# Patient Record
Sex: Female | Born: 1935 | Race: White | Hispanic: No | Smoking: Former smoker
Health system: Southern US, Community
[De-identification: ages and names within clinical notes are randomized; demographics above are authoritative.]

## PROBLEM LIST (undated history)

## (undated) DIAGNOSIS — T8859XA Other complications of anesthesia, initial encounter: Secondary | ICD-10-CM

## (undated) DIAGNOSIS — M1990 Unspecified osteoarthritis, unspecified site: Secondary | ICD-10-CM

## (undated) DIAGNOSIS — T4145XA Adverse effect of unspecified anesthetic, initial encounter: Secondary | ICD-10-CM

## (undated) DIAGNOSIS — I4891 Unspecified atrial fibrillation: Secondary | ICD-10-CM

## (undated) DIAGNOSIS — E119 Type 2 diabetes mellitus without complications: Secondary | ICD-10-CM

## (undated) DIAGNOSIS — E039 Hypothyroidism, unspecified: Secondary | ICD-10-CM

## (undated) DIAGNOSIS — M797 Fibromyalgia: Secondary | ICD-10-CM

## (undated) DIAGNOSIS — I10 Essential (primary) hypertension: Secondary | ICD-10-CM

## (undated) HISTORY — PX: HIP SURGERY: SHX245

## (undated) HISTORY — PX: EYE SURGERY: SHX253

## (undated) HISTORY — PX: CHOLECYSTECTOMY: SHX55

---

## 1999-08-02 ENCOUNTER — Other Ambulatory Visit: Admission: RE | Admit: 1999-08-02 | Discharge: 1999-08-02 | Payer: Self-pay | Admitting: Obstetrics and Gynecology

## 1999-08-03 ENCOUNTER — Other Ambulatory Visit: Admission: RE | Admit: 1999-08-03 | Discharge: 1999-08-03 | Payer: Self-pay | Admitting: *Deleted

## 1999-09-24 ENCOUNTER — Ambulatory Visit (HOSPITAL_COMMUNITY): Admission: RE | Admit: 1999-09-24 | Discharge: 1999-09-24 | Payer: Self-pay | Admitting: Obstetrics & Gynecology

## 2004-10-28 ENCOUNTER — Ambulatory Visit (HOSPITAL_COMMUNITY): Admission: RE | Admit: 2004-10-28 | Discharge: 2004-10-28 | Payer: Self-pay | Admitting: Family Medicine

## 2004-11-01 ENCOUNTER — Emergency Department (HOSPITAL_COMMUNITY): Admission: EM | Admit: 2004-11-01 | Discharge: 2004-11-01 | Payer: Self-pay | Admitting: Emergency Medicine

## 2004-12-23 ENCOUNTER — Encounter: Admission: RE | Admit: 2004-12-23 | Discharge: 2004-12-23 | Payer: Self-pay | Admitting: Orthopedic Surgery

## 2005-04-12 ENCOUNTER — Ambulatory Visit: Payer: Self-pay | Admitting: Physical Medicine & Rehabilitation

## 2005-04-12 ENCOUNTER — Inpatient Hospital Stay (HOSPITAL_COMMUNITY): Admission: RE | Admit: 2005-04-12 | Discharge: 2005-04-15 | Payer: Self-pay | Admitting: Orthopedic Surgery

## 2005-04-15 ENCOUNTER — Inpatient Hospital Stay (HOSPITAL_COMMUNITY)
Admission: RE | Admit: 2005-04-15 | Discharge: 2005-04-22 | Payer: Self-pay | Admitting: Physical Medicine & Rehabilitation

## 2005-07-22 ENCOUNTER — Emergency Department (HOSPITAL_COMMUNITY): Admission: EM | Admit: 2005-07-22 | Discharge: 2005-07-22 | Payer: Self-pay | Admitting: Emergency Medicine

## 2010-12-11 ENCOUNTER — Encounter: Payer: Self-pay | Admitting: Orthopedic Surgery

## 2011-02-01 ENCOUNTER — Inpatient Hospital Stay (HOSPITAL_COMMUNITY)
Admission: EM | Admit: 2011-02-01 | Discharge: 2011-02-03 | DRG: 392 | Disposition: A | Discharge status: Home / Self Care | Payer: Medicare Other | Attending: Family Medicine | Admitting: Family Medicine

## 2011-02-01 ENCOUNTER — Emergency Department (HOSPITAL_COMMUNITY): Payer: Medicare Other

## 2011-02-01 DIAGNOSIS — R339 Retention of urine, unspecified: Secondary | ICD-10-CM | POA: Diagnosis present

## 2011-02-01 DIAGNOSIS — I10 Essential (primary) hypertension: Secondary | ICD-10-CM | POA: Diagnosis present

## 2011-02-01 DIAGNOSIS — K5289 Other specified noninfective gastroenteritis and colitis: Principal | ICD-10-CM | POA: Diagnosis present

## 2011-02-01 DIAGNOSIS — IMO0002 Reserved for concepts with insufficient information to code with codable children: Secondary | ICD-10-CM | POA: Diagnosis present

## 2011-02-01 DIAGNOSIS — M129 Arthropathy, unspecified: Secondary | ICD-10-CM | POA: Diagnosis present

## 2011-02-01 DIAGNOSIS — M5137 Other intervertebral disc degeneration, lumbosacral region: Secondary | ICD-10-CM | POA: Diagnosis present

## 2011-02-01 DIAGNOSIS — K922 Gastrointestinal hemorrhage, unspecified: Secondary | ICD-10-CM | POA: Diagnosis not present

## 2011-02-01 DIAGNOSIS — M51379 Other intervertebral disc degeneration, lumbosacral region without mention of lumbar back pain or lower extremity pain: Secondary | ICD-10-CM | POA: Diagnosis present

## 2011-02-01 LAB — DIFFERENTIAL
Basophils Absolute: 0 10*3/uL (ref 0.0–0.1)
Basophils Relative: 0 % (ref 0–1)
Eosinophils Absolute: 0.3 10*3/uL (ref 0.0–0.7)
Eosinophils Relative: 4 % (ref 0–5)
Lymphocytes Relative: 16 % (ref 12–46)
Lymphs Abs: 1.4 10*3/uL (ref 0.7–4.0)
Monocytes Absolute: 0.7 10*3/uL (ref 0.1–1.0)
Monocytes Relative: 8 % (ref 3–12)
Neutro Abs: 6.5 10*3/uL (ref 1.7–7.7)
Neutrophils Relative %: 73 % (ref 43–77)

## 2011-02-01 LAB — CBC
HCT: 36.8 % (ref 36.0–46.0)
Hemoglobin: 12.5 g/dL (ref 12.0–15.0)
MCHC: 34 g/dL (ref 30.0–36.0)
MCV: 90.4 fL (ref 78.0–100.0)
RDW: 13.8 % (ref 11.5–15.5)
WBC: 8.9 10*3/uL (ref 4.0–10.5)

## 2011-02-01 LAB — OCCULT BLOOD, POC DEVICE
Fecal Occult Bld: POSITIVE
Fecal Occult Bld: NEGATIVE

## 2011-02-01 LAB — URINE MICROSCOPIC-ADD ON

## 2011-02-01 LAB — COMPREHENSIVE METABOLIC PANEL
ALT: 20 U/L (ref 0–35)
Albumin: 3 g/dL — ABNORMAL LOW (ref 3.5–5.2)
Alkaline Phosphatase: 48 U/L (ref 39–117)
BUN: 29 mg/dL — ABNORMAL HIGH (ref 6–23)
CO2: 28 mEq/L (ref 19–32)
Calcium: 8.9 mg/dL (ref 8.4–10.5)
Chloride: 98 mEq/L (ref 96–112)
Creatinine, Ser: 1.25 mg/dL — ABNORMAL HIGH (ref 0.4–1.2)
GFR calc Af Amer: 49 mL/min — ABNORMAL LOW (ref >60)
GFR calc non Af Amer: 40 mL/min — ABNORMAL LOW (ref >60)
Glucose, Bld: 133 mg/dL — ABNORMAL HIGH (ref 70–99)
Potassium: 3 mEq/L — ABNORMAL LOW (ref 3.5–5.1)
Sodium: 135 mEq/L (ref 135–145)
Total Protein: 5.8 g/dL — ABNORMAL LOW (ref 6.0–8.3)

## 2011-02-01 LAB — URINALYSIS, ROUTINE W REFLEX MICROSCOPIC
Glucose, UA: NEGATIVE mg/dL
Nitrite: NEGATIVE
Protein, ur: NEGATIVE mg/dL
Specific Gravity, Urine: 1.026 (ref 1.005–1.030)
Urobilinogen, UA: 0.2 mg/dL (ref 0.0–1.0)
pH: 5.5 (ref 5.0–8.0)

## 2011-02-02 DIAGNOSIS — K625 Hemorrhage of anus and rectum: Secondary | ICD-10-CM

## 2011-02-02 DIAGNOSIS — N139 Obstructive and reflux uropathy, unspecified: Secondary | ICD-10-CM

## 2011-02-02 DIAGNOSIS — R197 Diarrhea, unspecified: Secondary | ICD-10-CM

## 2011-02-02 LAB — CBC
HCT: 35.5 % — ABNORMAL LOW (ref 36.0–46.0)
HCT: 37.6 % (ref 36.0–46.0)
Hemoglobin: 11.8 g/dL — ABNORMAL LOW (ref 12.0–15.0)
Hemoglobin: 13 g/dL (ref 12.0–15.0)
MCH: 29.9 pg (ref 26.0–34.0)
MCH: 31 pg (ref 26.0–34.0)
MCHC: 33.2 g/dL (ref 30.0–36.0)
MCHC: 34.6 g/dL (ref 30.0–36.0)
MCV: 90.1 fL (ref 78.0–100.0)
MCV: 89.7 fL (ref 78.0–100.0)
Platelets: 342 10*3/uL (ref 150–400)
Platelets: 384 10*3/uL (ref 150–400)
RBC: 3.94 MIL/uL (ref 3.87–5.11)
RBC: 4.19 MIL/uL (ref 3.87–5.11)
RDW: 13.6 % (ref 11.5–15.5)
RDW: 13.7 % (ref 11.5–15.5)
WBC: 7.1 10*3/uL (ref 4.0–10.5)

## 2011-02-02 LAB — BASIC METABOLIC PANEL
BUN: 18 mg/dL (ref 6–23)
CO2: 25 mEq/L (ref 19–32)
Calcium: 8.3 mg/dL — ABNORMAL LOW (ref 8.4–10.5)
Creatinine, Ser: 0.77 mg/dL (ref 0.4–1.2)
GFR calc non Af Amer: >60 mL/min (ref >60)
Glucose, Bld: 95 mg/dL (ref 70–99)
Potassium: 3.5 mEq/L (ref 3.5–5.1)
Sodium: 138 mEq/L (ref 135–145)

## 2011-02-02 LAB — CLOSTRIDIUM DIFFICILE BY PCR: Toxigenic C. Difficile by PCR: NEGATIVE

## 2011-02-03 LAB — CBC
HCT: 36.1 % (ref 36.0–46.0)
Hemoglobin: 12 g/dL (ref 12.0–15.0)
MCH: 30.2 pg (ref 26.0–34.0)
MCHC: 33.2 g/dL (ref 30.0–36.0)
MCV: 90.7 fL (ref 78.0–100.0)
Platelets: 376 10*3/uL (ref 150–400)
RBC: 3.98 MIL/uL (ref 3.87–5.11)
RDW: 13.7 % (ref 11.5–15.5)
WBC: 6.3 10*3/uL (ref 4.0–10.5)

## 2011-02-03 LAB — GIARDIA/CRYPTOSPORIDIUM SCREEN(EIA)
Cryptosporidium Screen (EIA): NEGATIVE
Giardia Screen - EIA: NEGATIVE

## 2011-02-03 LAB — COMPREHENSIVE METABOLIC PANEL
ALT: 16 U/L (ref 0–35)
AST: 17 U/L (ref 0–37)
Albumin: 2.7 g/dL — ABNORMAL LOW (ref 3.5–5.2)
Alkaline Phosphatase: 45 U/L (ref 39–117)
CO2: 27 mEq/L (ref 19–32)
Chloride: 108 mEq/L (ref 96–112)
Creatinine, Ser: 0.67 mg/dL (ref 0.4–1.2)
GFR calc Af Amer: >60 mL/min (ref >60)
GFR calc non Af Amer: >60 mL/min (ref >60)
Glucose, Bld: 121 mg/dL — ABNORMAL HIGH (ref 70–99)
Potassium: 4.2 mEq/L (ref 3.5–5.1)
Total Bilirubin: 0.4 mg/dL (ref 0.3–1.2)
Total Protein: 5.3 g/dL — ABNORMAL LOW (ref 6.0–8.3)

## 2011-02-03 LAB — URINE CULTURE
Colony Count: NO GROWTH
Culture  Setup Time: 201203140431
Culture: NO GROWTH

## 2011-02-06 LAB — STOOL CULTURE

## 2011-02-22 NOTE — Discharge Summary (Signed)
[name], [name]:  [id number]  MEDICAL RECORD NO.:  [id number]           PATIENT TYPE:  I  LOCATION:  5015                         FACILITY:  [facility]  PHYSICIAN:  Nestor Ramp, MD        DATE OF BIRTH:  26-Mar-1920  DATE OF ADMISSION:  02/01/2011 DATE OF DISCHARGE:  02/03/2011                              DISCHARGE SUMMARY   DISCHARGE DIAGNOSES: 1. Gastroenteritis. 2. Urinary retention, resolved. 3. Degenerative disk disease. 4. Hypertension. 5. History of urinary tract infection. 6. Arthritis. 7. Neuralgia.  DISCHARGE MEDICATIONS: 1. Benicar/olmesartan/hydrochlorothiazide 40/12.5 mg p.o. daily. 2. Calcium citrate 1 tablet p.o. daily. 3. Docusate 100 mg p.o. daily p.r.n. 4. Fish oil OTC 1 tablet p.o. daily. 5. Multivitamin p.o. daily. 6. Neurontin 300 mg p.o. daily at bedtime. 7. Tramadol 50 mg p.o. q.6 h p.r.n. for pain. 8. Vicodin 5/500 mg p.o. q.6 h p.r.n. 9. Cymbalta 30 mg p.o. daily.  Note:  This dose is decreased from     previous home dose.  LABS AND IMAGING:  Urine culture, no growth.  Creatinine 0.77, hemoglobin 12.0.  C. diff PCR negative.  Acute abdominal series showing no acute intra-abdominal abnormalities.  Hemoccult positive.  Stool ova and parasites negative.  BRIEF HOSPITAL COURSE:  This is an 77 year old female who presented with 3 days of diarrhea and urinary retention. 1. Diarrhea.  The patient had 3 days of severe diarrhea consisting     primarily of loose brown stools and included incontinence of such.     This was disconcerting for the patient and her daughter, therefore     she presented to the emergency department.  On initial evaluation,     the stool appeared to be normal; however, after rectal exam, the     patient did show some bright red blood per rectum.  This was self-     limited and the patient's hemoglobin was monitored throughout her     stay.  On arrival, the patient's hemoglobin was 12.5 and after  development of this lower GI bleed, remained stable at 13.0.  At     the time of discharge, her hemoglobin was 12.0 and she had no signs     of hemodynamic instability.  The bleeding aspects of her diarrhea     had resolved and her stooling episodes had diminished.  Lab and     stool studies were negative including C. diff PCR and the patient     denied associated abdominal pain, nausea, vomiting, fevers, or red     flag symptoms concerning for more insidious etiologies.  The     patient did receive maintenance IV fluids; however, she never     required significant hydration and was hemodynamically stable.  The     patient was tolerating normal oral diet prior to discharge.  2. Urinary retention.  The patient was found to have 700 mL of     residual urine in her bladder and was unable to void at the time of     admission.  The patient denied history of this sort of problem, and  a Foley catheter was placed for a short period of time.  After     discontinuation of this catheter, the patient had successful     voiding trial spontaneously.  Most likely this retention was iatrogenic in nature, as it resolved with decrease of her Cymbalta dose from 60 mg to 30 mg. The patient was monitored for 24 hours and postvoid     residual was a mere 15 mL prior to discharge.  3. Question of UTI.  When the patient presented, she had symptoms of     UTI with dysuria and urgency; however, UA was negative for white     blood cells and culture was negative.  She was covered initially on     empiric Keflex; however, this was discontinued at the time of     discharge.  The patient remained afebrile, and symptoms had     resolved before discharge.  DISCHARGE INSTRUCTIONS:  The patient was discharged to home with instructions to call clinic or emergency department if she continues to have blood in her stool, develops abdominal pain, fevers, or any other concerning symptoms.  APPOINTMENTS:  [name], MD.  PCP Appointment to made within 1-2 weeks.  FOLLOWUP ISSUES: 1. Resolution of GI bleeding, possible need for colonoscopy. 2. Resolution of urinary retention.  The patient was discharged to home in stable medical condition.    ______________________________ Lloyd Huger, MD   ______________________________ Nestor Ramp, MD    JK/MEDQ  D:  02/03/2011  T:  02/04/2011  Job:  [id number]  Electronically Signed by Lloyd Huger MD on 02/04/2011 08:56:07 PM Electronically Signed by Denny Levy MD on 02/22/2011 02:23:17 PM

## 2011-03-09 NOTE — H&P (Signed)
NAME:  Kelli Pena, Kelli Pena               ACCOUNT NO.:  [id number]  MEDICAL RECORD NO.:  [id number]           PATIENT TYPE:  I  LOCATION:  5015                         FACILITY:  [facility]  PHYSICIAN:  Emmagrace Runkel A. Sheffield Slider, M.D.    DATE OF BIRTH:  [birth year]  DATE OF ADMISSION:  02/01/2011 DATE OF DISCHARGE:                             HISTORY & PHYSICAL   PRIMARY CARE PHYSICIAN:  [name], MD, in [facility].  CHIEF COMPLAINT:  Diarrhea and urinary retention.  HISTORY OF PRESENT ILLNESS:  The patient is an 77 year old female who comes to the emergency department after diarrhea x3 days and urinary retention x3 days.  Since Sunday, she patient has had frequent watery stool sometimes every 15 minutes, positive incontinence of stools since diarrhea started, positive urinary retention x3 days, positive dysuria and burning with urination x3 days.  The patient states she feels tired and fatigued.  Has been taking Imodium with no relief.  Last antibiotic for a month ago for UTI.  Denies any bloody or dark stools.  In the ER, placed Foley cath, has 700 mL residual of urine.  REVIEW OF SYSTEMS:  No fever.  No chills.  Positive constipation prior to diarrhea.  No recent antibiotics.  Her last antibiotic was 3 months ago.  No melena.  No bright red blood per rectum.  Positive rough in taste off and on.  No sick contacts.  MEDICATIONS:  The patient is unsure of dosage, knows names. 1. Tramadol. 2. Vicodin. 3. Cymbalta. 4. Benicar. 5. HCTZ 40/12.5. 6. Fish oil. 7. Multivitamin. 8. Calcium 9. Neurontin. 10.Stool softener.  ALLERGIES:  TETANUS SHOT, swelling.  PAST MEDICAL HISTORY: 1. Neuralgia x15 years. 2. Arthritis. 3. Degenerative disk disease at L3-L4. 4. Hypertension. 5. UTI 3 months ago.  FAMILY HISTORY:  Mother, Parkinson's and breast cancer.  Father, CVA at age 32.  Daughter, healthy.  Twin sister, arthritis and PVD.  SOCIAL HISTORY:  The patient lives at home.  She is a widow.   Her daughter lives nearby.  Her daughter is a Engineer, civil (consulting).  No tobacco, stopped 40 years ago.  Occasional EtOH and no drugs.  PHYSICAL EXAMINATION:  VITAL SIGNS:  Blood pressure 108/56, heart rate 88, respiratory rate 17, and temp 97.9. GENERAL:  No acute distress, resting quietly in bed. HEENT:  Pupils are equal, round, and reactive to light and accommodation.  No lymphadenopathy. PULMONARY:  Clear to auscultation bilateral. CARDIOVASCULAR:  Regular rate and rhythm.  No murmurs, rubs, or gallops. ABDOMEN:  Soft, nontender, and nondistended.  No guarding.  No rebound. EXTREMITIES:  Trace lower extremity edema. NEURO:  Alert and orient x3, following all commands. RECTAL:  Oozing present around anus and surrounding buttock region. Good anal tone.  Positive pain and discomfort on rectal exam.  No stool is palpated.  LABORATORY DATA AND STUDY:  Hemoccult cards negative by primary team. Hemoccult card positive in the ER.  Acute abdominal series, no acute abnormality.  Urine micro, rare epi, 0-2 white blood cells, hyaline cast.  UA:  Small bili, negative nitrites, trace leukocyte esterase.  PERTINENT LABORATORY DATA:  Sodium 135, potassium 3.0, BUN  29, creatinine 1.25, glucose 133, total protein 5.8, calcium 8.9, albumin 3, AST 22, ALT 20, alk phos 48.  White blood cell 8.9 and hemoglobin 12.5.  ASSESSMENT AND PLAN:  The patient is an 77 year old female with diarrhea and urinary retention. 1. Diarrhea:  No melena.  No bright red blood per rectum.  One     positive Hemoccult.  One negative Hemoccult.  C. diff/PCR pending.     Stool O and P pending.  We will monitor strict I's and O's.  We     will give normal saline IV fluids at 100 mL per hour.  The patient     is encouraged to eat p.o. fluids and solids, unsure of cause at     this time. 2. Questionable urinary tract infection:  Urine culture pending.     Since the patient is symptomatic, we will treat with Keflex.     Reviewed med list.   No medications on home list have side effects     of urinary retention.  We will follow for urine culture.  If     negative, we will stop Keflex. 3. Decreased potassium:  We will replete with p.o. KCl.  Repeat BMET     in the morning. 4. Neuralgia and arthritis pain:  We will continue home pain regimen     of tramadol, Vicodin, Neurontin, and Cymbalta. 5. Hypertension:  We will continue to monitor closely.  We will hold     Benicar and HCTZ since creatinine increased.  Unsure of the     patient's baseline.  We will monitor blood pressure closely. 6. Diet:  Regular diet. 7. Prophylaxis:  Heparin 5000 units t.i.d. subcu. 8. Disposition:  Pending clinical improvement.     Ellin Mayhew, MD   ______________________________ Arnette Norris. Sheffield Slider, M.D.    DC/MEDQ  D:  02/02/2011  T:  02/02/2011  Job:  [id number]  Electronically Signed by Ellin Mayhew  on 03/08/2011 02:19:43 PM Electronically Signed by Zachery Dauer M.D. on 03/09/2011 04:40:22 AM

## 2012-12-26 ENCOUNTER — Emergency Department (HOSPITAL_COMMUNITY): Payer: Medicare Other

## 2012-12-26 ENCOUNTER — Inpatient Hospital Stay (HOSPITAL_COMMUNITY)
Admission: EM | Admit: 2012-12-26 | Discharge: 2012-12-29 | DRG: 534 | Disposition: A | Discharge status: Skilled Nursing Facility | Payer: Medicare Other | Attending: Internal Medicine | Admitting: Internal Medicine

## 2012-12-26 ENCOUNTER — Encounter (HOSPITAL_COMMUNITY): Payer: Self-pay | Admitting: *Deleted

## 2012-12-26 DIAGNOSIS — N390 Urinary tract infection, site not specified: Secondary | ICD-10-CM

## 2012-12-26 DIAGNOSIS — W19XXXA Unspecified fall, initial encounter: Secondary | ICD-10-CM

## 2012-12-26 DIAGNOSIS — I10 Essential (primary) hypertension: Secondary | ICD-10-CM | POA: Diagnosis present

## 2012-12-26 DIAGNOSIS — S63509A Unspecified sprain of unspecified wrist, initial encounter: Secondary | ICD-10-CM | POA: Diagnosis present

## 2012-12-26 DIAGNOSIS — E039 Hypothyroidism, unspecified: Secondary | ICD-10-CM | POA: Diagnosis present

## 2012-12-26 DIAGNOSIS — S72409A Unspecified fracture of lower end of unspecified femur, initial encounter for closed fracture: Principal | ICD-10-CM

## 2012-12-26 DIAGNOSIS — R42 Dizziness and giddiness: Secondary | ICD-10-CM

## 2012-12-26 DIAGNOSIS — E119 Type 2 diabetes mellitus without complications: Secondary | ICD-10-CM | POA: Diagnosis present

## 2012-12-26 DIAGNOSIS — S7290XA Unspecified fracture of unspecified femur, initial encounter for closed fracture: Secondary | ICD-10-CM

## 2012-12-26 DIAGNOSIS — W010XXA Fall on same level from slipping, tripping and stumbling without subsequent striking against object, initial encounter: Secondary | ICD-10-CM | POA: Diagnosis present

## 2012-12-26 DIAGNOSIS — R55 Syncope and collapse: Secondary | ICD-10-CM

## 2012-12-26 DIAGNOSIS — Y92009 Unspecified place in unspecified non-institutional (private) residence as the place of occurrence of the external cause: Secondary | ICD-10-CM

## 2012-12-26 HISTORY — DX: Type 2 diabetes mellitus without complications: E11.9

## 2012-12-26 HISTORY — DX: Adverse effect of unspecified anesthetic, initial encounter: T41.45XA

## 2012-12-26 HISTORY — DX: Other complications of anesthesia, initial encounter: T88.59XA

## 2012-12-26 HISTORY — DX: Fibromyalgia: M79.7

## 2012-12-26 HISTORY — DX: Hypothyroidism, unspecified: E03.9

## 2012-12-26 HISTORY — DX: Essential (primary) hypertension: I10

## 2012-12-26 HISTORY — DX: Unspecified osteoarthritis, unspecified site: M19.90

## 2012-12-26 LAB — TYPE AND SCREEN: ABO/RH(D): A POS

## 2012-12-26 LAB — URINALYSIS, ROUTINE W REFLEX MICROSCOPIC
Bilirubin Urine: NEGATIVE
Glucose, UA: NEGATIVE mg/dL
Specific Gravity, Urine: 1.017 (ref 1.005–1.030)
pH: 5.5 (ref 5.0–8.0)

## 2012-12-26 LAB — COMPREHENSIVE METABOLIC PANEL
ALT: 11 U/L (ref 0–35)
AST: 16 U/L (ref 0–37)
Albumin: 3.2 g/dL — ABNORMAL LOW (ref 3.5–5.2)
CO2: 27 mEq/L (ref 19–32)
Calcium: 9.3 mg/dL (ref 8.4–10.5)
Creatinine, Ser: 0.77 mg/dL (ref 0.50–1.10)
Sodium: 135 mEq/L (ref 135–145)
Total Protein: 6.5 g/dL (ref 6.0–8.3)

## 2012-12-26 LAB — CBC WITH DIFFERENTIAL/PLATELET
Basophils Absolute: 0 10*3/uL (ref 0.0–0.1)
Basophils Relative: 0 % (ref 0–1)
Eosinophils Absolute: 0.3 10*3/uL (ref 0.0–0.7)
Eosinophils Relative: 3 % (ref 0–5)
Lymphocytes Relative: 18 % (ref 12–46)
MCHC: 33.5 g/dL (ref 30.0–36.0)
MCV: 88.6 fL (ref 78.0–100.0)
Platelets: 420 10*3/uL — ABNORMAL HIGH (ref 150–400)
RDW: 15.1 % (ref 11.5–15.5)
WBC: 9.7 10*3/uL (ref 4.0–10.5)

## 2012-12-26 LAB — URINE MICROSCOPIC-ADD ON

## 2012-12-26 LAB — ABO/RH: ABO/RH(D): A POS

## 2012-12-26 LAB — PROTIME-INR
INR: 0.98 (ref 0.00–1.49)
Prothrombin Time: 12.9 seconds (ref 11.6–15.2)

## 2012-12-26 MED ORDER — ASPIRIN 325 MG PO TABS
325.0000 mg | ORAL_TABLET | Freq: Every day | ORAL | Status: DC
  Administered 2012-12-27 – 2012-12-29 (×3): 325 mg via ORAL
  Filled 2012-12-26 (×4): qty 1

## 2012-12-26 MED ORDER — LOSARTAN POTASSIUM 50 MG PO TABS
100.0000 mg | ORAL_TABLET | Freq: Every day | ORAL | Status: DC
  Administered 2012-12-27 – 2012-12-29 (×3): 100 mg via ORAL
  Filled 2012-12-26 (×3): qty 2

## 2012-12-26 MED ORDER — GABAPENTIN 300 MG PO CAPS
600.0000 mg | ORAL_CAPSULE | Freq: Three times a day (TID) | ORAL | Status: DC
  Administered 2012-12-26 – 2012-12-29 (×8): 600 mg via ORAL
  Filled 2012-12-26 (×10): qty 2

## 2012-12-26 MED ORDER — SODIUM CHLORIDE 0.9 % IJ SOLN
3.0000 mL | Freq: Two times a day (BID) | INTRAMUSCULAR | Status: DC
  Administered 2012-12-28 – 2012-12-29 (×3): 3 mL via INTRAVENOUS

## 2012-12-26 MED ORDER — VITAMIN D (ERGOCALCIFEROL) 1.25 MG (50000 UNIT) PO CAPS
50000.0000 [IU] | ORAL_CAPSULE | ORAL | Status: DC
  Administered 2012-12-27: 50000 [IU] via ORAL
  Filled 2012-12-26: qty 1

## 2012-12-26 MED ORDER — ROPINIROLE HCL 1 MG PO TABS
2.0000 mg | ORAL_TABLET | Freq: Every day | ORAL | Status: DC
  Administered 2012-12-26 – 2012-12-28 (×3): 2 mg via ORAL
  Filled 2012-12-26 (×4): qty 2

## 2012-12-26 MED ORDER — DULOXETINE HCL 60 MG PO CPEP
60.0000 mg | ORAL_CAPSULE | Freq: Every day | ORAL | Status: DC
  Administered 2012-12-27 – 2012-12-29 (×3): 60 mg via ORAL
  Filled 2012-12-26 (×3): qty 1

## 2012-12-26 MED ORDER — GABAPENTIN 600 MG PO TABS
300.0000 mg | ORAL_TABLET | Freq: Three times a day (TID) | ORAL | Status: DC | PRN
  Filled 2012-12-26: qty 0.5

## 2012-12-26 MED ORDER — DEXTROSE 5 % IV SOLN
1.0000 g | INTRAVENOUS | Status: DC
  Administered 2012-12-27 – 2012-12-28 (×3): 1 g via INTRAVENOUS
  Filled 2012-12-26 (×4): qty 10

## 2012-12-26 MED ORDER — GABAPENTIN 300 MG PO CAPS
300.0000 mg | ORAL_CAPSULE | Freq: Three times a day (TID) | ORAL | Status: DC | PRN
  Filled 2012-12-26: qty 1

## 2012-12-26 MED ORDER — POLYETHYLENE GLYCOL 3350 17 G PO PACK
17.0000 g | PACK | Freq: Every day | ORAL | Status: DC | PRN
  Filled 2012-12-26: qty 1

## 2012-12-26 MED ORDER — DOCUSATE SODIUM 100 MG PO CAPS
100.0000 mg | ORAL_CAPSULE | Freq: Two times a day (BID) | ORAL | Status: DC
  Administered 2012-12-26 – 2012-12-29 (×6): 100 mg via ORAL
  Filled 2012-12-26 (×7): qty 1

## 2012-12-26 MED ORDER — HYDROCHLOROTHIAZIDE 25 MG PO TABS
25.0000 mg | ORAL_TABLET | Freq: Every day | ORAL | Status: DC
  Administered 2012-12-27 – 2012-12-29 (×3): 25 mg via ORAL
  Filled 2012-12-26 (×3): qty 1

## 2012-12-26 MED ORDER — CELECOXIB 200 MG PO CAPS
200.0000 mg | ORAL_CAPSULE | Freq: Every evening | ORAL | Status: DC
Start: 2012-12-26 — End: 2012-12-29
  Administered 2012-12-26 – 2012-12-28 (×3): 200 mg via ORAL
  Filled 2012-12-26 (×4): qty 1

## 2012-12-26 MED ORDER — ENOXAPARIN SODIUM 40 MG/0.4ML ~~LOC~~ SOLN
40.0000 mg | SUBCUTANEOUS | Status: DC
  Administered 2012-12-26 – 2012-12-28 (×3): 40 mg via SUBCUTANEOUS
  Filled 2012-12-26 (×4): qty 0.4

## 2012-12-26 MED ORDER — MORPHINE SULFATE 2 MG/ML IJ SOLN
2.0000 mg | INTRAMUSCULAR | Status: AC | PRN
  Administered 2012-12-26 (×3): 2 mg via INTRAVENOUS
  Filled 2012-12-26 (×3): qty 1

## 2012-12-26 MED ORDER — LOSARTAN POTASSIUM-HCTZ 100-25 MG PO TABS: 1.0000 | ORAL_TABLET | Freq: Two times a day (BID) | ORAL | Status: DC

## 2012-12-26 MED ORDER — ONDANSETRON HCL 4 MG PO TABS: 4.0000 mg | ORAL_TABLET | Freq: Four times a day (QID) | ORAL | Status: DC | PRN

## 2012-12-26 MED ORDER — MORPHINE SULFATE 2 MG/ML IJ SOLN: 2.0000 mg | INTRAMUSCULAR | Status: DC | PRN

## 2012-12-26 MED ORDER — HYDROCODONE-ACETAMINOPHEN 5-325 MG PO TABS
1.0000 | ORAL_TABLET | Freq: Four times a day (QID) | ORAL | Status: DC | PRN
  Administered 2012-12-26 – 2012-12-27 (×2): 2 via ORAL
  Filled 2012-12-26 (×2): qty 2

## 2012-12-26 MED ORDER — TRAMADOL HCL 50 MG PO TABS
50.0000 mg | ORAL_TABLET | Freq: Four times a day (QID) | ORAL | Status: DC | PRN
  Administered 2012-12-26 – 2012-12-29 (×4): 50 mg via ORAL
  Filled 2012-12-26 (×4): qty 1

## 2012-12-26 MED ORDER — SODIUM CHLORIDE 0.9 % IV SOLN
INTRAVENOUS | Status: DC
  Administered 2012-12-26 – 2012-12-29 (×3): via INTRAVENOUS

## 2012-12-26 MED ORDER — SODIUM CHLORIDE 0.9 % IV SOLN
20.0000 mL | INTRAVENOUS | Status: DC
  Administered 2012-12-26: 20 mL via INTRAVENOUS

## 2012-12-26 MED ORDER — GABAPENTIN 600 MG PO TABS
600.0000 mg | ORAL_TABLET | Freq: Three times a day (TID) | ORAL | Status: DC
  Filled 2012-12-26: qty 1

## 2012-12-26 MED ORDER — LEVOTHYROXINE SODIUM 75 MCG PO TABS
75.0000 ug | ORAL_TABLET | Freq: Every day | ORAL | Status: DC
  Administered 2012-12-27 – 2012-12-29 (×3): 75 ug via ORAL
  Filled 2012-12-26 (×4): qty 1

## 2012-12-26 MED ORDER — ONDANSETRON HCL 4 MG/2ML IJ SOLN: 4.0000 mg | Freq: Four times a day (QID) | INTRAMUSCULAR | Status: DC | PRN

## 2012-12-26 NOTE — Progress Notes (Signed)
Cathey (pt's daughter) cell ph#:( [phone]

## 2012-12-26 NOTE — ED Notes (Signed)
Pt fell at home 2 days ago- was not eval at that time- denied LOC and pain after fall- Now left knee pain with movement- and swelling- pt refused immobilization- denies neck and back pain

## 2012-12-26 NOTE — ED Notes (Signed)
YNW:[id number]<[id number]> Expected date:<BR> Expected time:<BR> Means of arrival:<BR> Comments:<BR> 79 y/o F/ fall 2 days ago/ knee pain

## 2012-12-26 NOTE — ED Notes (Signed)
Attempted to give report. RN busy.

## 2012-12-26 NOTE — ED Notes (Signed)
Linwood Dibbles MD at bedside.

## 2012-12-26 NOTE — ED Provider Notes (Addendum)
History    CSN: [id number] Arrival date & time 12/26/12  1458 First MD Initiated Contact with Patient 12/26/12 1501    Chief Complaint  Patient presents with  . Fall   HPI  Pt fell at home 2 days ago.  Pt does not recall what happened.  She thinks she might have gotten a little dizzy.  She was able to get up after 5 minutes but she had a hard time standing.  Family was able to check on her and care for her through out the day.  They had to assist her at home and help her use a bed pan. Usually pt would be able to walk around with a cane. Pt is having pain in her right wrist and knee.  The knee is much more severe and very swollen. She has seen Dr Charlann Boxer in the past for her knee trouble so they contacted him.  Family states that he wants to see her in the ED. Past Medical History  Diagnosis Date  . Hypertension   . Diabetes mellitus without complication   . Arthritis     No past surgical history on file.  No family history on file.  History  Substance Use Topics  . Smoking status: Not on file  . Smokeless tobacco: Not on file  . Alcohol Use:     OB History    Grav Para Term Preterm Abortions TAB SAB Ect Mult Living                  Review of Systems  All other systems reviewed and are negative.    Allergies  Tetanus toxoids  Home Medications  No current outpatient prescriptions on file.  There were no vitals taken for this visit.  Physical Exam  Nursing note and vitals reviewed. Constitutional: She appears well-developed and well-nourished. No distress.  HENT:  Head: Normocephalic and atraumatic.  Right Ear: External ear normal.  Left Ear: External ear normal.  Eyes: Conjunctivae normal are normal. Right eye exhibits no discharge. Left eye exhibits no discharge. No scleral icterus.  Neck: Neck supple. No tracheal deviation present.  Cardiovascular: Normal rate, regular rhythm and intact distal pulses.   Pulmonary/Chest: Effort normal and breath sounds normal. No  stridor. No respiratory distress. She has no wheezes. She has no rales.  Abdominal: Soft. Bowel sounds are normal. She exhibits no distension. There is no tenderness. There is no rebound and no guarding.  Musculoskeletal: She exhibits tenderness. She exhibits no edema.       Right wrist: She exhibits swelling. She exhibits normal range of motion, no tenderness and no deformity.       Left knee: She exhibits decreased range of motion, swelling, effusion and deformity. She exhibits no laceration and no erythema. tenderness found.  Neurological: She is alert. She has normal strength. No sensory deficit. Cranial nerve deficit:  no gross defecits noted. She exhibits normal muscle tone. She displays no seizure activity. Coordination normal.  Skin: Skin is warm and dry. No rash noted.  Psychiatric: She has a normal mood and affect.    ED Course  Procedures (including critical care time) EKG Sinus rhythm, rate 81 Multiple premature atrial complexes Borderline low voltage in frontal leads Normal axis Prior EKG poor quality otherwise no significant change noted  Labs Reviewed  CBC WITH DIFFERENTIAL - Abnormal; Notable for the following:    Platelets 420 (*)     All other components within normal limits  COMPREHENSIVE METABOLIC PANEL - Abnormal;  Notable for the following:    Glucose, Bld 130 (*)     Albumin 3.2 (*)     GFR calc non Af Amer 71 (*)     GFR calc Af Amer 83 (*)     All other components within normal limits  PROTIME-INR  TYPE AND SCREEN  ABO/RH  URINALYSIS, ROUTINE W REFLEX MICROSCOPIC   Dg Wrist Complete Right  12/26/2012  *RADIOLOGY REPORT*  Clinical Data: Fall.  Wrist pain  RIGHT WRIST - COMPLETE 3+ VIEW  Comparison: None  Findings: The bones are diffusely osteopenic.  There is no acute fracture or subluxation identified.  Advanced osteoarthritis is noted at the basilar joint.  IMPRESSION:  1.  Basilar joint osteoarthritis. 2.  Osteopenia.   Original Report Authenticated By:  Signa Kell, M.D.    Dg Knee Complete 4 Views Left  12/26/2012  *RADIOLOGY REPORT*  Clinical Data: Fall.  Knee pain.  LEFT KNEE - COMPLETE 4+ VIEW  Comparison: None  Findings: Moderate sized joint effusion is identified.  There is a comminuted intra-articular fracture deformity involving the distal femur.  The fracture fragments appear to extend from the distal medial femoral metaphysis into the femoral notch.  The fracture is likely comminuted.  There is moderate to advanced tricompartment osteoarthritis with sharpening of the tibial spines, exuberant marginal spur formation and joint space narrowing.femoral  IMPRESSION:  1.  Comminuted intra-articular fracture of the distal femur. Advise further evaluation with noncontrast CT ofthe knee 2.  Joint effusion. 3.  Osteoarthritis.   Original Report Authenticated By: Signa Kell, M.D.      1. Fracture of distal femur   2. Fall   3. Near syncope       MDM  Discussed with Dr Rennis Chris.  Would recommend long leg splint.  Non operative care. He will see the patient in the ED  Pt is unable to return home with her injury.  Will consult with medical service for pain management, possible rehabilitation placement.  Patient is also not sure why she fell. It is possible she could have had a syncopal event. Currently she is not complaining of any focal neurologic deficits but she may benefit from further evaluation as to the reason why she fell and why she felt dizzy.   Celene Kras, MD 12/26/12 [phone]

## 2012-12-26 NOTE — ED Notes (Signed)
Pt requesting pain medication, but informed pt morphine is currently q4h and last dose was taken at 1800.

## 2012-12-26 NOTE — Consult Note (Signed)
Reason for Consult:Left knee and right wrist pain after fall, ?"dizzy spell" Referring Physician: EDP   HPI: Kelli Pena is an 79 y.o. female fell at home 2 days ago. Pt does not recall what happened. She thinks she might have gotten a little dizzy. She was able to get up after 5 minutes but she had a hard time standing. Family was able to check on her and care for her through out the day. They had to assist her at home and help her use a bed pan. Usually pt would be able to walk around with a cane. Pt is having pain in her right wrist and knee. The knee is much more severe and very swollen.  Past Medical History  Diagnosis Date  . Hypertension   . Diabetes mellitus without complication   . Arthritis   . Hypothyroid   . Fibromyalgia   . Complication of anesthesia     trouble waking up, very cold, hypotension    Past Surgical History  Procedure Date  . Hip surgery   . Cholecystectomy   . Eye surgery     History reviewed. No pertinent family history.  Social History:  reports that she has quit smoking. She has never used smokeless tobacco. She reports that she drinks alcohol. She reports that she does not use illicit drugs.  Allergies:  Allergies  Allergen Reactions  . Tetanus Toxoids     Medications: I have reviewed the patient's current medications.  Results for orders placed during the hospital encounter of 12/26/12 (from the past 48 hour(s))  CBC WITH DIFFERENTIAL     Status: Abnormal   Collection Time   12/26/12  3:33 PM      Component Value Range Comment   WBC 9.7  4.0 - 10.5 K/uL    RBC 4.31  3.87 - 5.11 MIL/uL    Hemoglobin 12.8  12.0 - 15.0 g/dL    HCT 16.1  09.6 - 04.5 %    MCV 88.6  78.0 - 100.0 fL    MCH 29.7  26.0 - 34.0 pg    MCHC 33.5  30.0 - 36.0 g/dL    RDW 40.9  81.1 - 91.4 %    Platelets 420 (*) 150 - 400 K/uL    Neutrophils Relative 69  43 - 77 %    Neutro Abs 6.7  1.7 - 7.7 K/uL    Lymphocytes Relative 18  12 - 46 %    Lymphs Abs 1.8  0.7 -  4.0 K/uL    Monocytes Relative 9  3 - 12 %    Monocytes Absolute 0.9  0.1 - 1.0 K/uL    Eosinophils Relative 3  0 - 5 %    Eosinophils Absolute 0.3  0.0 - 0.7 K/uL    Basophils Relative 0  0 - 1 %    Basophils Absolute 0.0  0.0 - 0.1 K/uL   COMPREHENSIVE METABOLIC PANEL     Status: Abnormal   Collection Time   12/26/12  3:33 PM      Component Value Range Comment   Sodium 135  135 - 145 mEq/L    Potassium 4.1  3.5 - 5.1 mEq/L    Chloride 98  96 - 112 mEq/L    CO2 27  19 - 32 mEq/L    Glucose, Bld 130 (*) 70 - 99 mg/dL    BUN 17  6 - 23 mg/dL    Creatinine, Ser 7.82  0.50 - 1.10 mg/dL  Calcium 9.3  8.4 - 10.5 mg/dL    Total Protein 6.5  6.0 - 8.3 g/dL    Albumin 3.2 (*) 3.5 - 5.2 g/dL    AST 16  0 - 37 U/L    ALT 11  0 - 35 U/L    Alkaline Phosphatase 67  39 - 117 U/L    Total Bilirubin 0.5  0.3 - 1.2 mg/dL    GFR calc non Af Amer 71 (*) >90 mL/min    GFR calc Af Amer 83 (*) >90 mL/min   PROTIME-INR     Status: Normal   Collection Time   12/26/12  3:33 PM      Component Value Range Comment   Prothrombin Time 12.9  11.6 - 15.2 seconds    INR 0.98  0.00 - 1.49   TYPE AND SCREEN     Status: Normal   Collection Time   12/26/12  3:33 PM      Component Value Range Comment   ABO/RH(D) A POS      Antibody Screen NEG      Sample Expiration 12/29/2012     ABO/RH     Status: Normal   Collection Time   12/26/12  3:33 PM      Component Value Range Comment   ABO/RH(D) A POS       Dg Wrist Complete Right  12/26/2012  *RADIOLOGY REPORT*  Clinical Data: Fall.  Wrist pain  RIGHT WRIST - COMPLETE 3+ VIEW  Comparison: None  Findings: The bones are diffusely osteopenic.  There is no acute fracture or subluxation identified.  Advanced osteoarthritis is noted at the basilar joint.  IMPRESSION:  1.  Basilar joint osteoarthritis. 2.  Osteopenia.   Original Report Authenticated By: Signa Kell, M.D.    Dg Knee Complete 4 Views Left  12/26/2012  *RADIOLOGY REPORT*  Clinical Data: Fall.  Knee pain.   LEFT KNEE - COMPLETE 4+ VIEW  Comparison: None  Findings: Moderate sized joint effusion is identified.  There is a comminuted intra-articular fracture deformity involving the distal femur.  The fracture fragments appear to extend from the distal medial femoral metaphysis into the femoral notch.  The fracture is likely comminuted.  There is moderate to advanced tricompartment osteoarthritis with sharpening of the tibial spines, exuberant marginal spur formation and joint space narrowing.femoral  IMPRESSION:  1.  Comminuted intra-articular fracture of the distal femur. Advise further evaluation with noncontrast CT ofthe knee 2.  Joint effusion. 3.  Osteoarthritis.   Original Report Authenticated By: Signa Kell, M.D.      Vitals Temp:  [97.9 F (36.6 C)] 97.9 F (36.6 C) (02/05 1517) Pulse Rate:  [81] 81  (02/05 1517) Resp:  [16] 16  (02/05 1517) BP: (156)/(51) 156/51 mmHg (02/05 1517) SpO2:  [93 %] 93 % (02/05 1517) There is no height or weight on file to calculate BMI.  Physical Exam: elderly WF, HOH, cooperative, [place]/AT No clavicular crepitance or tenderness. Left shoulder pain with motion (chronic from cuff tear). Right wrist swollen and ecchymotic, tender dorsally and limited ROM, grossly NV intact. RLE no gross bone or joint instability. Left LLE with diffuse swelling, tnderness, pain with attempted ROM. NV intact distally BLE.  Left knee with endstage arthrosis and minimally displaced distal femur fracture. R wrist neg for fx, severe thumb basal jt oa.     Assessment/Plan: Impression: Minimally displaced left distal femur fracture End stage knee OA Right wrist sprain  Treatment:  Knee immobilizer to LLE, keep well  padded and perform skin checks daily. OK for bed to chair transfers. Non weight bearing LLE Right wrist splint PRN, ad lib use RUE No CT scan F/u with [facility] 2 to 3 weeks for repeat xrays. Call if any questions  Tegan Burnside M 12/26/2012, 5:22 PM

## 2012-12-26 NOTE — H&P (Addendum)
[facility] History and Physical  Kelli Pena DGL:[id number] DOB: 1920-11-05 [name]: 12/26/2012  Referring physician: er PCP: Shelda Pal, MD  Specialists: ortho  Chief Complaint: fall  HPI: Kelli Pena is a 79 y.o. female  Who fell at home 2 days ago.  She normally walks with a cane.  She doesn't remember if she was dizzy when she fell or not but she has gotten dizzy in the past- mainly in the AM when she first wakes up.  She fell about 11PM.  After she fell, she was able to get to the phone and call her family.  Her daughter, a Engineer, civil (consulting), cared for her through out the day.  After the fall she had pain in her right wrist and left knee-- this worsened, with swelling so family brought her to the ER.  She denies LOC or losing control of her bladder/bowels.  She is actually constipated.  No CP, no SOB with daily activity  In the ER, she was found to have a fracture in her left femur (distal).  It also appears she has an UTI.  She was still requiring pain medications so she will be admitted for syncope/falls and pain management.    Review of Systems: all systems reviewed, negative unless stated above  Past Medical History  Diagnosis Date  . Hypertension   . Diabetes mellitus without complication   . Arthritis   . Hypothyroid   . Fibromyalgia   . Complication of anesthesia     trouble waking up, very cold, hypotension   Past Surgical History  Procedure Date  . Hip surgery   . Cholecystectomy   . Eye surgery    Social History:  reports that she has quit smoking. She has never used smokeless tobacco. She reports that she drinks alcohol. She reports that she does not use illicit drugs. Occasional alcohol Walks with a cane  Allergies  Allergen Reactions  . Tetanus Toxoids     Family History  Problem Relation Age of Onset  . Stroke Father     Prior to Admission medications   Medication Sig Start Date End Date Taking? Authorizing Provider  aspirin 325 MG tablet Take  325 mg by mouth daily with breakfast.   Yes Historical Provider, MD  celecoxib (CELEBREX) 200 MG capsule Take 200 mg by mouth every evening.    Yes Historical Provider, MD  DULoxetine (CYMBALTA) 60 MG capsule Take 60 mg by mouth every morning.   Yes Historical Provider, MD  gabapentin (NEURONTIN) 300 MG capsule Take 300 mg by mouth 3 (three) times daily as needed. Nerve pain   Yes Historical Provider, MD  gabapentin (NEURONTIN) 600 MG tablet Take 600 mg by mouth 3 (three) times daily.   Yes Historical Provider, MD  HYDROcodone-acetaminophen (NORCO/VICODIN) 5-325 MG per tablet Take 1 tablet by mouth every 6 (six) hours as needed. pain   Yes Historical Provider, MD  levothyroxine (SYNTHROID, LEVOTHROID) 75 MCG tablet Take 75 mcg by mouth daily.   Yes Historical Provider, MD  losartan-hydrochlorothiazide (HYZAAR) 100-25 MG per tablet Take 1 tablet by mouth 2 (two) times daily.   Yes Historical Provider, MD  metFORMIN (GLUCOPHAGE) 1000 MG tablet Take 1,000 mg by mouth 2 (two) times daily with a meal.   Yes Historical Provider, MD  rOPINIRole (REQUIP) 2 MG tablet Take 2 mg by mouth at bedtime.   Yes Historical Provider, MD  traMADol (ULTRAM) 50 MG tablet Take 50 mg by mouth every 6 (six) hours as needed.  Yes Historical Provider, MD  Vitamin D, Ergocalciferol, (DRISDOL) 50000 UNITS CAPS Take 50,000 Units by mouth 2 (two) times a week. Saturday   Yes Historical Provider, MD   Physical Exam: Filed Vitals:   12/26/12 1517  BP: 156/51  Pulse: 81  Temp: 97.9 F (36.6 C)  TempSrc: Oral  Resp: 16  SpO2: 93%     General:  Pleasant/cooperative- hard of hearing  Eyes: wnl  ENT: wnl  Neck: supple, no JVD  Cardiovascular: rrr  Respiratory: clear, no wheezing  Abdomen: +BS, soft, NT/ND  Skin: no rashes, few bruises  Musculoskeletal: right wrist swelling, left knee swelling, decreased ROM  Psychiatric: normal  Neurologic: no focal deficits  Labs on Admission:  Basic Metabolic  Panel:  Lab 12/26/12 1533  NA 135  K 4.1  CL 98  CO2 27  GLUCOSE 130*  BUN 17  CREATININE 0.77  CALCIUM 9.3  MG --  PHOS --   Liver Function Tests:  Lab 12/26/12 1533  AST 16  ALT 11  ALKPHOS 67  BILITOT 0.5  PROT 6.5  ALBUMIN 3.2*   No results found for this basename: LIPASE:5,AMYLASE:5 in the last 168 hours No results found for this basename: AMMONIA:5 in the last 168 hours CBC:  Lab 12/26/12 1533  WBC 9.7  NEUTROABS 6.7  HGB 12.8  HCT 38.2  MCV 88.6  PLT 420*   Cardiac Enzymes: No results found for this basename: CKTOTAL:5,CKMB:5,CKMBINDEX:5,TROPONINI:5 in the last 168 hours  BNP (last 3 results) No results found for this basename: PROBNP:3 in the last 8760 hours CBG: No results found for this basename: GLUCAP:5 in the last 168 hours  Radiological Exams on Admission: Dg Wrist Complete Right  12/26/2012  *RADIOLOGY REPORT*  Clinical Data: Fall.  Wrist pain  RIGHT WRIST - COMPLETE 3+ VIEW  Comparison: None  Findings: The bones are diffusely osteopenic.  There is no acute fracture or subluxation identified.  Advanced osteoarthritis is noted at the basilar joint.  IMPRESSION:  1.  Basilar joint osteoarthritis. 2.  Osteopenia.   Original Report Authenticated By: Signa Kell, M.D.    Dg Knee Complete 4 Views Left  12/26/2012  *RADIOLOGY REPORT*  Clinical Data: Fall.  Knee pain.  LEFT KNEE - COMPLETE 4+ VIEW  Comparison: None  Findings: Moderate sized joint effusion is identified.  There is a comminuted intra-articular fracture deformity involving the distal femur.  The fracture fragments appear to extend from the distal medial femoral metaphysis into the femoral notch.  The fracture is likely comminuted.  There is moderate to advanced tricompartment osteoarthritis with sharpening of the tibial spines, exuberant marginal spur formation and joint space narrowing.femoral  IMPRESSION:  1.  Comminuted intra-articular fracture of the distal femur. Advise further evaluation  with noncontrast CT ofthe knee 2.  Joint effusion. 3.  Osteoarthritis.   Original Report Authenticated By: Signa Kell, M.D.     EKG: Independently reviewed. Sinus with PAC  Assessment/Plan Active Problems:  UTI (lower urinary tract infection)  Dizziness  Fall  Femur fracture   Minimally displaced left distal femur fracture: Knee immobilizer to LLE, keep well padded and perform skin checks daily. OK for bed to chair transfers. Non weight bearing LLE Right wrist sprain: splint PRN F/up with ortho in 2-3 weeks for repeat x rays -PT/OT, may need placement   Dizziness/fall- place on tele, orthostatics, TSH, troponins  UTI- rocephin  HTN- continue home meds and monitor  Pain- cotninue home meds and add IV, transitioning to PO at D/C  ortho  Code Status: full Family Communication: daughter at bedside Disposition Plan: SNF?  Time spent: 70 min  Benjamine Mola Tennyson Kallen [facility] Pager [phone]  If 7PM-7AM, please contact night-coverage [email or web address] Password [facility] 12/26/2012, 5:42 PM

## 2012-12-26 NOTE — Progress Notes (Signed)
ED CM consulted by ED CM about possible needs for home health CM spoke with EDP, Roselyn Bering who states [facility] has been consulted for admission Pending. CM spoke with pt, daughter, [name] and female family member in ED. [name] states the pt and family choice is for snf placement and then home health services.  CM provided her with a list [place] home health agencies and list of snfs to assist with choices   HOME HEALTH AGENCIES SERVING [place]   Agencies that are Medicare-Certified and are affiliated with [facility] Health Agency  Telephone Number Address  [facility]   The [place] [facility] has ownership interest in this company; however, you are under no obligation to use this agency. [phone] or  [phone] [address] [place], [place] [address] [email or web address]   Agencies that are Medicare-Certified and are not affiliated with [facility] Agency Telephone Number Address  [facility] [phone] Fax [phone] [address] [place], [place]  [address] [email or web address]  [facility] [phone] or [phone] Fax [phone] [address] [place], [place] [address] [email or web address]  Care [facility] [phone] Fax [phone] [address] [place], [place] [address] [email or web address]  [facility] [phone] Fax [phone] [address] [place], [place]  [address] [email or web address]  [facility] [phone] Fax [phone] [address] Suite [place], [place] [address] [email or web address]  [facility] of [facility] [phone] [address] [place], [place]  [address] [email or web address]  [facility] [phone]  [address] [place], [place] [address] [email or web address]  [facility] [phone] or [phone] Fax number [phone] [address] [place], [place]  [phone] [email or web address]  [facility] [phone] Fax [phone] [address] [place], [place]  [address]  [facility]  [phone] Fax [phone] [address] [place], [place] [address] [email or web address]    LIST OF NURSING FACILITIES WITHIN 50 MILES OF [facility]/ [facility]   has ownership interest in the following facility, which is licensed to provide nursing facility care.  You are under no obligation to accept a bed at this facility or to receive care there.  NAME OF FACILITY ADDRESS TELEPHONE NUMBER MANAGED CARE CONTRACTS OFFERED BED OFFERED?  [facility]   [address] [place], [place]  [phone] United Health Care/AARP Medicare Complete, Fifth Third Bancorp,  [place], Pyramids Today's Options, Community education officer    The following facilities, listed by [employer], are also licensed to provide nursing facility care:  [employer]:  NAME OF FACILITY ADDRESS TELEPHONE NUMBER MANAGED CARE CONTRACTS OFFERED BED OFFERED?  [facility] [address] [place], [place] [phone] [facility] Medicare Complete,  [facility] Medicare Complete Choice, Evercare, Fifth Third Bancorp,  Coventry/Wellpath, Advantra Medicare,  Pyramid Today's Options/Pyramid Life   [facility] [address]  [place], [place]  [phone] [place], Blue Cross Blue Shield   CMS Energy Corporation Nursing and [facility] [address] [place], [place] [phone] [facility] Plans, [address], [address],  [place] (including Fast  Track Humana Paramedic)   [facility] [address] [place], [place]  [phone] Affiliated Computer Services, BCBS,  UHC/Medicare Complete,  Fifth Third Bancorp, Wellpath/ Coventry/Advantra Medicare   [employer]  [address] [place], [place]  [phone] [facility] PPO and private     [facility] [address] [place], [place]  [phone] Humana,  Today's Options,  Evercare, Medicare Complete   [facility] [employer] [employer] [address] [place], [place]  [phone] [facility] Advantage   [facility] [place] [phone] [address] [place], [place]  [phone] [facility] Medicare Advantage   [facility] [address] [place], [place]  [phone] Fifth Third [employer]anc[employer], AARP Medicare Complete, Evercare, Ross Stores, Today's Options/Pyramids, BCBS D'Lo, Aetna   [facility] [address] [place], [place]  [phone] Blue Medicare/Partners, AARP Medicare Complete, Evercare, Ross Stores, Today's Options/Pyramids, BCBS [place], [facility]   [facility] [address] [place], [place]  [phone]  Ezequiel Essex, [facility] [address] [place], [place] [phone] BCBS, [place], [place], [address], [address], [address], Secure Horizons, [place], [facility]   [place] [facility]  [address] [place], [place]  [phone] BCBS, [facility], Advantra Medicare, [facility] [address] [place], [place]  [phone] BCBS ,[place], [facility], Tricare   [facility]   [address]  [place], [place]  [phone] Aetna, Rosine Door, BCBS of [place], CHAMPUS, Coventry/Wellpath, Partners, [address], Copy, Location manager, The Progressive Corporation, UHC, Control and instrumentation engineer, [place]   [facility]  [address] [place], [place]  [phone] [facility] at [facility] [address] [place], [place]  [phone] [facility] Medicare   [facility] [address] [place], [place]  [phone] [address] [facility] , [place], [place], [place], [facility]    [facility] [address] [place], [place]  [phone] BCBS, [place], [place] Health Care    [facility] [phone] [address] [place], [place]  [phone] BCBS, [place], [place], [facility]   [facility] [address] [place], [place]  [phone]) [phone] [facility] Medicare   [place]: A [facility] and [facility] [address] [place], [place]  [phone] [facility] Medicare

## 2012-12-26 NOTE — ED Notes (Signed)
Attempted to call report. Receiving RN busy. 

## 2012-12-26 NOTE — ED Notes (Signed)
Dr Nilsa Nutting office made aware pt is in ED per vorb from EDP Roselyn Bering

## 2012-12-27 ENCOUNTER — Inpatient Hospital Stay (HOSPITAL_COMMUNITY): Payer: Medicare Other

## 2012-12-27 DIAGNOSIS — N390 Urinary tract infection, site not specified: Secondary | ICD-10-CM

## 2012-12-27 LAB — BASIC METABOLIC PANEL
BUN: 14 mg/dL (ref 6–23)
CO2: 26 mEq/L (ref 19–32)
Chloride: 99 mEq/L (ref 96–112)
GFR calc Af Amer: 86 mL/min — ABNORMAL LOW (ref >90)
Potassium: 3.8 mEq/L (ref 3.5–5.1)

## 2012-12-27 LAB — TROPONIN I
Troponin I: <0.3 ng/mL (ref <0.30)
Troponin I: <0.3 ng/mL (ref <0.30)

## 2012-12-27 LAB — CBC
HCT: 37.1 % (ref 36.0–46.0)
Hemoglobin: 12.4 g/dL (ref 12.0–15.0)
MCV: 88.5 fL (ref 78.0–100.0)
WBC: 9.2 10*3/uL (ref 4.0–10.5)

## 2012-12-27 MED ORDER — MORPHINE SULFATE 2 MG/ML IJ SOLN
2.0000 mg | INTRAMUSCULAR | Status: DC | PRN
  Administered 2012-12-27: 2 mg via INTRAVENOUS
  Filled 2012-12-27: qty 1

## 2012-12-27 MED ORDER — HYDROCODONE-ACETAMINOPHEN 5-325 MG PO TABS
1.0000 | ORAL_TABLET | ORAL | Status: DC | PRN
  Administered 2012-12-27 – 2012-12-28 (×2): 1 via ORAL
  Administered 2012-12-28: 2 via ORAL
  Administered 2012-12-29: 1 via ORAL
  Administered 2012-12-29: 2 via ORAL
  Filled 2012-12-27: qty 2
  Filled 2012-12-27: qty 1
  Filled 2012-12-27 (×2): qty 2

## 2012-12-27 NOTE — Care Management Note (Unsigned)
    Page 1 of 1   12/27/2012     11:59:07 AM   CARE MANAGEMENT NOTE 12/27/2012  Patient:  Kelli Pena, Kelli Pena   Account Number:  [id number]  Date Initiated:  12/27/2012  Documentation initiated by:  [facility]  Subjective/Objective Assessment:   ADMITTED W/FALL.L FEMUR FX,SYNCOPE.     Action/Plan:   FROM HOME ALONE.HAS DAUGHTER SUPPORT(NURSE).HAS PCP,PHARMACY.   Anticipated DC Date:  12/31/2012   Anticipated DC Plan:  SKILLED NURSING FACILITY      DC Planning Services  CM consult      Choice offered to / List presented to:             Status of service:  In process, will continue to follow Medicare Important Message given?   (If response is "NO", the following Medicare IM given date fields will be blank) Date Medicare IM given:   Date Additional Medicare IM given:    Discharge Disposition:    Per UR Regulation:  Reviewed for med. necessity/level of care/duration of stay  If discussed at Long Length of Stay Meetings, dates discussed:    Comments:  12/27/12 Domenik Trice RN,BSN NCM [phone] PT/OT-SNF.NWB,L KNEE IMMOBILIZER.ORTHO FOLLOWING.

## 2012-12-27 NOTE — Evaluation (Signed)
Physical Therapy Evaluation Patient Details Name: Kelli Pena MRN: [id number] DOB: 10-20-1920 Today's Date: 12/27/2012 Time: 7829-5621 PT Time Calculation (min): 16 min  PT Assessment / Plan / Recommendation Clinical Impression  Pt admitted after fall home and sustaining L distal femur fx.  Pt now currently NWB L LE and must maintain KI.  Pt would benefit from acute PT services in order to improve independence with transfers and w/c mobility to prepare for d/c to SNF.    PT Assessment  Patient needs continued PT services    Follow Up Recommendations  SNF;Supervision/Assistance - 24 hour    Does the patient have the potential to tolerate intense rehabilitation      Barriers to Discharge        Equipment Recommendations  Wheelchair (measurements PT)    Recommendations for Other Services     Frequency Min 3X/week    Precautions / Restrictions Precautions Precautions: Fall Required Braces or Orthoses: Knee Immobilizer - Left Knee Immobilizer - Left: On at all times Restrictions Weight Bearing Restrictions: Yes LLE Weight Bearing: Non weight bearing   Pertinent Vitals/Pain RN reports pt medicated around 8 am.  Pt reports pain with movement but better at rest. Repositioned to comfort in recliner.      Mobility  Bed Mobility Bed Mobility: Supine to Sit Supine to Sit: 3: Mod assist;With rails;HOB elevated Details for Bed Mobility Assistance: verbal cues for technique, pt able to move LEs over with cues however required assist for trunk and scooting to EOB Transfers Transfers: Stand to Sit;Sit to Stand;Stand Pivot Transfers Sit to Stand: 1: +2 Total assist;With upper extremity assist Sit to Stand: Patient Percentage: 50% Stand to Sit: 1: +2 Total assist Stand to Sit: Patient Percentage: 50% Stand Pivot Transfers: 1: +2 Total assist Stand Pivot Transfers: Patient Percentage: 50% Details for Transfer Assistance: demonstrated transfer prior to performing, increased cues  for NWB and pt able to maintain however unable to hop so pivoted foot and chair brought behind pt, verbal cues for safe technique, used RW for transfer Ambulation/Gait Ambulation/Gait Assistance: Not tested (comment)    Exercises     PT Diagnosis: Acute pain;Difficulty walking  PT Problem List: Decreased strength;Decreased activity tolerance;Decreased mobility;Decreased knowledge of use of DME;Pain;Decreased knowledge of precautions PT Treatment Interventions: DME instruction;Functional mobility training;Therapeutic activities;Therapeutic exercise;Balance training;Patient/family education;Wheelchair mobility training   PT Goals Acute Rehab PT Goals PT Goal Formulation: With patient Time For Goal Achievement: 01/10/13 Potential to Achieve Goals: Good Pt will go Supine/Side to Sit: with supervision PT Goal: Supine/Side to Sit - Progress: Goal set today Pt will go Sit to Supine/Side: with supervision PT Goal: Sit to Supine/Side - Progress: Goal set today Pt will go Sit to Stand: with supervision PT Goal: Sit to Stand - Progress: Goal set today Pt will go Stand to Sit: with supervision PT Goal: Stand to Sit - Progress: Goal set today Pt will Transfer Bed to Chair/Chair to Bed: with supervision PT Transfer Goal: Bed to Chair/Chair to Bed - Progress: Goal set today Pt will Propel Wheelchair: with modified independence;51 - 150 feet PT Goal: Propel Wheelchair - Progress: Goal set today  Visit Information  Last PT Received On: 12/27/12 Assistance Needed: +2 PT/OT Co-Evaluation/Treatment: Yes    Subjective Data  Subjective: I'll do what I can.   Prior Functioning  Home Living Lives With: Alone Type of Home: House Home Layout: One level Bathroom Shower/Tub: Engineer, manufacturing systems: Standard Additional Comments: plan will be to go to SNF for rehab  Prior Function Level of Independence: Independent Communication Communication: No difficulties (pt is HOH) Dominant Hand:  Right    Cognition  Cognition Overall Cognitive Status: Appears within functional limits for tasks assessed/performed Arousal/Alertness: Awake/alert Orientation Level: Appears intact for tasks assessed Behavior During Session: [facility] for tasks performed    Extremity/Trunk Assessment Right Lower Extremity Assessment RLE ROM/Strength/Tone: [facility] for tasks assessed (knee valgus present) Left Lower Extremity Assessment LLE ROM/Strength/Tone: Deficits;Due to precautions LLE ROM/Strength/Tone Deficits: maintained KI, able to perform ankle DF/PF   Balance    End of Session PT - End of Session Equipment Utilized During Treatment: Left knee immobilizer Activity Tolerance: Patient tolerated treatment well Patient left: in chair;with call bell/phone within reach;Other (comment) (with OT) Nurse Communication: Other (comment) (NWB, KI, and +2 assist written on white board)  GP     Les Longmore,KATHrine E 12/27/2012, 10:59 AM  Zenovia Jarred, PT, DPT 12/27/2012 Pager: [phone]

## 2012-12-27 NOTE — Progress Notes (Signed)
[facility] PROGRESS NOTE  Kelli Pena WUJ:[id number] DOB: Apr 12, 1920 [name]: 12/26/2012 PCP: Shelda Pal, MD  Assessment/Plan: Minimally displaced left distal femur fracture: Knee immobilizer to LLE, keep well padded and perform skin checks daily. OK for bed to chair transfers. Non weight bearing LLE  Right wrist sprain: splint PRN  F/up with ortho in 2-3 weeks for repeat x rays  -PT/OT, may need placement   Dizziness/fall- place on tele ok, orthostatics, TSH ok, troponins ok  UTI- rocephin day #2- await culture  HTN- continue home meds and monitor   Pain- cotninue home meds and add IV, transitioning to PO at D/C      Code Status: full Family Communication:  Disposition Plan: SNF   Consultants:  Charlann Boxer (ortho)  Procedures:    Antibiotics:  Rocephin 2/6  HPI/Subjective: Pain not controlled- still throbbing No SOB, no CP  Objective: Filed Vitals:   12/26/12 2045 12/27/12 0508 12/27/12 0509 12/27/12 0510  BP: 159/69 151/64 150/62 149/65  Pulse: 90 89 89 85  Temp: 98.5 F (36.9 C) 98.4 F (36.9 C)    TempSrc: Oral Oral    Resp: 18 18    Height: 4\' 11"  (1.499 m)     Weight: 70.6 kg (155 lb 10.3 oz)     SpO2: 98% 94% 94% 93%    Intake/Output Summary (Last 24 hours) at 12/27/12 0908 Last data filed at 12/27/12 0300  Gross per 24 hour  Intake 696.67 ml  Output    250 ml  Net 446.67 ml   Filed Weights   12/26/12 2045  Weight: 70.6 kg (155 lb 10.3 oz)    Exam:   General:  Pleasant/cooperative  Cardiovascular: rrr  Respiratory: clear anterior, no wheezing  Abdomen: +BS, soft, NT/ND  Data Reviewed: Basic Metabolic Panel:  Lab 12/27/12 9562 12/26/12 1533  NA 134* 135  K 3.8 4.1  CL 99 98  CO2 26 27  GLUCOSE 140* 130*  BUN 14 17  CREATININE 0.68 0.77  CALCIUM 8.8 9.3  MG -- --  PHOS -- --   Liver Function Tests:  Lab 12/26/12 1533  AST 16  ALT 11  ALKPHOS 67  BILITOT 0.5  PROT 6.5  ALBUMIN 3.2*   No results found for  this basename: LIPASE:5,AMYLASE:5 in the last 168 hours No results found for this basename: AMMONIA:5 in the last 168 hours CBC:  Lab 12/27/12 0225 12/26/12 1533  WBC 9.2 9.7  NEUTROABS -- 6.7  HGB 12.4 12.8  HCT 37.1 38.2  MCV 88.5 88.6  PLT 436* 420*   Cardiac Enzymes:  Lab 12/27/12 0225 12/26/12 2051  CKTOTAL -- --  CKMB -- --  CKMBINDEX -- --  TROPONINI <0.30 <0.30   BNP (last 3 results) No results found for this basename: PROBNP:3 in the last 8760 hours CBG: No results found for this basename: GLUCAP:5 in the last 168 hours  No results found for this or any previous visit (from the past 240 hour(s)).   Studies: Dg Wrist Complete Right  12/26/2012  *RADIOLOGY REPORT*  Clinical Data: Fall.  Wrist pain  RIGHT WRIST - COMPLETE 3+ VIEW  Comparison: None  Findings: The bones are diffusely osteopenic.  There is no acute fracture or subluxation identified.  Advanced osteoarthritis is noted at the basilar joint.  IMPRESSION:  1.  Basilar joint osteoarthritis. 2.  Osteopenia.   Original Report Authenticated By: Signa Kell, M.D.    Ct Knee Left Wo Contrast  12/27/2012  *RADIOLOGY REPORT*  Clinical Data:  Knee pain status post fall 1 day ago.  Evaluate distal femur fracture.  CT OF THE LEFT KNEE WITHOUT CONTRAST  Technique:  Multidetector CT imaging was performed according to the standard protocol. Multiplanar CT image reconstructions were also generated.  Comparison: Radiographs 12/26/2012.  Findings: There is a mildly displaced intra-articular fracture involving the medial femoral condyle.  This fracture demonstrates intercondylar extension and involves the posteromedial cortex of the metaphysis.  There is mild involvement of the medial femoral condyle anteriorly with 3 mm of cortical step off at the articular surface anteriorly, best seen on the reformatted images.  No involvement of the lateral femoral condyle is identified.  The patella, proximal tibia and proximal fibula are intact.   There are advanced tricompartmental degenerative changes with osteophytes.  There is subchondral sclerosis in the medial and femoral compartments.  The bones appear diffusely demineralized.  There is a large hemarthrosis.  A small amount of layering fat is present within a moderately distended Baker's cyst.  IMPRESSION:  1.  Mildly displaced intra-articular fracture of the medial femoral condyle with intercondylar extension as described. 2.  Associated large lipohemarthrosis. 3.  Underlying advanced tricompartmental degenerative changes.   Original Report Authenticated By: Carey Bullocks, M.D.    Dg Knee Complete 4 Views Left  12/26/2012  *RADIOLOGY REPORT*  Clinical Data: Fall.  Knee pain.  LEFT KNEE - COMPLETE 4+ VIEW  Comparison: None  Findings: Moderate sized joint effusion is identified.  There is a comminuted intra-articular fracture deformity involving the distal femur.  The fracture fragments appear to extend from the distal medial femoral metaphysis into the femoral notch.  The fracture is likely comminuted.  There is moderate to advanced tricompartment osteoarthritis with sharpening of the tibial spines, exuberant marginal spur formation and joint space narrowing.femoral  IMPRESSION:  1.  Comminuted intra-articular fracture of the distal femur. Advise further evaluation with noncontrast CT ofthe knee 2.  Joint effusion. 3.  Osteoarthritis.   Original Report Authenticated By: Signa Kell, M.D.     Scheduled Meds:    . aspirin  325 mg Oral Q breakfast  . cefTRIAXone (ROCEPHIN)  IV  1 g Intravenous Q24H  . celecoxib  200 mg Oral QPM  . docusate sodium  100 mg Oral BID  . DULoxetine  60 mg Oral Daily  . enoxaparin (LOVENOX) injection  40 mg Subcutaneous Q24H  . gabapentin  600 mg Oral TID  . hydrochlorothiazide  25 mg Oral Daily  . levothyroxine  75 mcg Oral QAC breakfast  . losartan  100 mg Oral Daily  . rOPINIRole  2 mg Oral QHS  . sodium chloride  3 mL Intravenous Q12H  . Vitamin D  (Ergocalciferol)  50,000 Units Oral 2 times weekly   Continuous Infusions:    . sodium chloride 50 mL/hr at 12/26/12 2204    Active Problems:  UTI (lower urinary tract infection)  Dizziness  Fall  Femur fracture    Time spent: 25    Marlin Canary  [facility] Pager [phone]. If 8PM-8AM, please contact night-coverage at [email or web address], password [facility] 12/27/2012, 9:08 AM  LOS: 1 day

## 2012-12-27 NOTE — Progress Notes (Signed)
   Subjective: Left distal femur fracture   Patient reports pain as moderate. No events throughout the night.  Objective:   VITALS:   Filed Vitals:   12/27/12  BP: 149/65  Pulse: 85  Temp: 98.4 F (36.9 C)   Resp: 18    Neurovascular intact Dorsiflexion/Plantar flexion intact No cellulitis present Compartment soft  LABS  Basename 12/27/12 0225 12/26/12 1533  HGB 12.4 12.8  HCT 37.1 38.2  WBC 9.2 9.7  PLT 436* 420*     Basename 12/27/12 0225 12/26/12 1533  NA 134* 135  K 3.8 4.1  BUN 14 17  CREATININE 0.68 0.77  GLUCOSE 140* 130*     Assessment/Plan: Left distal femur fracture CT of left knee ordered for further evaluation of the fracture Knee immobilizer to LLE, keep well padded and perform skin checks daily. OK for bed to chair transfers. Non weight bearing LLE  Up with therapy   Anastasio Auerbach. Marcille Barman   PAC  12/27/2012, 9:58 AM

## 2012-12-27 NOTE — Evaluation (Signed)
Occupational Therapy Evaluation Patient Details Name: Kelli Pena MRN: [id number] DOB: [birth year] Today's Date: 12/27/2012 Time: 4098-1191 OT Time Calculation (min): 16 min  OT Assessment / Plan / Recommendation Clinical Impression  Pt presents to OT s/p fall and is NWB on LLE. Pt will benefit from skilled OT for education and practice maintaining NWB status for ADL activity    OT Assessment  Patient needs continued OT Services    Follow Up Recommendations  SNF    Barriers to Discharge Decreased caregiver support    Equipment Recommendations  None recommended by OT    Recommendations for Other Services PT consult  Frequency  Min 2X/week    Precautions / Restrictions Precautions Precautions: Fall Required Braces or Orthoses: Knee Immobilizer - Left Knee Immobilizer - Left: On at all times Restrictions Weight Bearing Restrictions: Yes LLE Weight Bearing: Non weight bearing       ADL  Grooming: Performed;Brushing hair;Minimal assistance Where Assessed - Grooming: Supported sitting Upper Body Bathing: Simulated;Minimal assistance Where Assessed - Upper Body Bathing: Unsupported sitting Lower Body Bathing: Simulated;+2 Total assistance Lower Body Bathing: Patient Percentage: 50% Where Assessed - Lower Body Bathing: Supported sit to stand Upper Body Dressing: Simulated;Minimal assistance Where Assessed - Upper Body Dressing: Unsupported sitting Lower Body Dressing: Simulated;+2 Total assistance Lower Body Dressing: Patient Percentage: 50% Where Assessed - Lower Body Dressing: Supported sit to stand Toilet Transfer: Simulated;+2 Total assistance;Other (comment) (bed to chair) Toilet Transfer: Patient Percentage: 50%    OT Diagnosis: Generalized weakness;Acute pain  OT Problem List: Decreased strength;Decreased activity tolerance;Decreased safety awareness;Impaired balance (sitting and/or standing);Decreased knowledge of use of DME or AE;Pain OT Treatment Interventions:  Self-care/ADL training;DME and/or AE instruction;Patient/family education   OT Goals Acute Rehab OT Goals OT Goal Formulation: With patient Time For Goal Achievement: 01/10/13 Potential to Achieve Goals: Good ADL Goals Pt Will Perform Grooming: with supervision;Standing at sink ADL Goal: Grooming - Progress: Goal set today Pt Will Perform Lower Body Dressing: with min assist;with adaptive equipment;Sit to stand from chair ADL Goal: Lower Body Dressing - Progress: Goal set today Pt Will Transfer to Toilet: with min assist;Maintaining weight bearing status;3-in-1 ADL Goal: Toilet Transfer - Progress: Goal set today Pt Will Perform Toileting - Clothing Manipulation: with min assist;Standing;Sitting on 3-in-1 or toilet ADL Goal: Toileting - Clothing Manipulation - Progress: Goal set today  Visit Information  Last OT Received On: 12/27/12 Assistance Needed: +2    Subjective Data  Subjective: I dont know how i am going to do this ( maintain NWB)   Prior Functioning     Home Living Lives With: Alone Type of Home: House Home Layout: One level Bathroom Shower/Tub: Engineer, manufacturing systems: Standard Additional Comments: plan will be to go to SNF for rehab  Prior Function Level of Independence: Independent Communication Communication: No difficulties (pt is HOH) Dominant Hand: Right         Vision/Perception Vision - History Patient Visual Report: No change from baseline   Cognition  Cognition Overall Cognitive Status: Appears within functional limits for tasks assessed/performed Arousal/Alertness: Awake/alert Orientation Level: Appears intact for tasks assessed Behavior During Session: [facility] for tasks performed    Extremity/Trunk Assessment Right Lower Extremity Assessment RLE ROM/Strength/Tone: [facility] for tasks assessed (knee valgus present) Left Lower Extremity Assessment LLE ROM/Strength/Tone: Deficits;Due to precautions LLE ROM/Strength/Tone Deficits:  maintained KI, able to perform ankle DF/PF              End of Session OT - End of Session Equipment Utilized  During Treatment: Left knee immobilizer Activity Tolerance: Patient tolerated treatment well Patient left: in chair;with call bell/phone within reach  GO     Kelli Pena, Kelli Pena 12/27/2012, 10:53 AM

## 2012-12-28 MED ORDER — LIDOCAINE 5 % EX PTCH: 1.0000 | MEDICATED_PATCH | CUTANEOUS | Status: DC

## 2012-12-28 MED ORDER — HYDRALAZINE HCL 20 MG/ML IJ SOLN
10.0000 mg | Freq: Four times a day (QID) | INTRAMUSCULAR | Status: DC | PRN
  Administered 2012-12-28: 10 mg via INTRAVENOUS
  Filled 2012-12-28: qty 1

## 2012-12-28 MED ORDER — DSS 100 MG PO CAPS: 100.0000 mg | ORAL_CAPSULE | Freq: Two times a day (BID) | ORAL | Status: DC

## 2012-12-28 MED ORDER — LIDOCAINE 5 % EX PTCH
1.0000 | MEDICATED_PATCH | CUTANEOUS | Status: DC
  Administered 2012-12-28 – 2012-12-29 (×2): 1 via TRANSDERMAL
  Filled 2012-12-28 (×2): qty 1

## 2012-12-28 MED ORDER — ENOXAPARIN SODIUM 40 MG/0.4ML ~~LOC~~ SOLN: 40.0000 mg | SUBCUTANEOUS | Status: DC

## 2012-12-28 MED ORDER — HYDROCODONE-ACETAMINOPHEN 5-325 MG PO TABS
1.0000 | ORAL_TABLET | ORAL | Status: DC | PRN
Start: 2012-12-28 — End: 2012-12-29

## 2012-12-28 MED ORDER — LOSARTAN POTASSIUM 100 MG PO TABS: 100.0000 mg | ORAL_TABLET | Freq: Every day | ORAL | Status: DC

## 2012-12-28 MED ORDER — POLYETHYLENE GLYCOL 3350 17 G PO PACK: 17.0000 g | PACK | Freq: Every day | ORAL | Status: DC | PRN

## 2012-12-28 MED ORDER — HYDROCHLOROTHIAZIDE 25 MG PO TABS: 25.0000 mg | ORAL_TABLET | Freq: Every day | ORAL | Status: DC

## 2012-12-28 MED ORDER — METOPROLOL TARTRATE 12.5 MG HALF TABLET: 12.5000 mg | ORAL_TABLET | Freq: Two times a day (BID) | ORAL | Status: DC

## 2012-12-28 MED ORDER — METOPROLOL TARTRATE 12.5 MG HALF TABLET
12.5000 mg | ORAL_TABLET | Freq: Two times a day (BID) | ORAL | Status: DC
  Administered 2012-12-28 – 2012-12-29 (×3): 12.5 mg via ORAL
  Filled 2012-12-28 (×4): qty 1

## 2012-12-28 NOTE — Discharge Summary (Signed)
Physician Discharge Summary  ARDYN FORGE MVH:[id number] DOB: 1919/11/23 [name]: 12/26/2012  PCP: Shelda Pal, MD  Admit date: 12/26/2012 Discharge date: 12/29/2012  Time spent: 35 minutes  Recommendations for Outpatient Follow-up:  1. CBC, BMP 1 week 2. Monitor BP and increase NWB left leg  Knee immobilizer at all times except to bathe the leg to allow the knee to heal  Skin checks daily Follow up in 2 weeks at [facility].   Discharge Diagnoses:  Active Problems:   UTI (lower urinary tract infection)   Dizziness   Fall   Femur fracture   Discharge Condition: improved  Diet recommendation: cardiac  Filed Weights   12/26/12 2045  Weight: 70.6 kg (155 lb 10.3 oz)    History of present illness:  Kelli Pena is a 79 y.o. female  Who fell at home 2 days ago. She normally walks with a cane. She doesn't remember if she was dizzy when she fell or not but she has gotten dizzy in the past- mainly in the AM when she first wakes up. She fell about 11PM. After she fell, she was able to get to the phone and call her family. Her daughter, a Engineer, civil (consulting), cared for her through out the day. After the fall she had pain in her right wrist and left knee-- this worsened, with swelling so family brought her to the ER. She denies LOC or losing control of her bladder/bowels. She is actually constipated. No CP, no SOB with daily activity  In the ER, she was found to have a fracture in her left femur (distal). It also appears she has an UTI. She was still requiring pain medications so she will be admitted for syncope/falls and pain management.   Hospital Course:  Minimally displaced left distal femur fracture: Knee immobilizer to LLE, keep well padded and perform skin checks daily. OK for bed to chair transfers. Non weight bearing LLE  Right wrist sprain: splint PRN    Dizziness/fall- place on tele ok, orthostatics ok, TSH ok, troponins ok- on multiple medications that can cause dizziness-  can be addressed by PCP as an outpatient is dizziness continues after rehab  UTI- rocephin x 4 days- gram negative rods- treated   HTN- continue home meds- add metoprolol as BP still high- may need further titration as an outpatient   Pain- controlled with PO    Procedures:    Consultations:  Ortho  Discharge Exam: Filed Vitals:   12/28/12 0654 12/28/12 1329 12/28/12 2115 12/29/12 0731  BP: 149/56 147/65 149/77 156/84  Pulse:  82 77 83  Temp:  98.2 F (36.8 C) 97.2 F (36.2 C) 99.7 F (37.6 C)  TempSrc:  Oral Oral Oral  Resp:  18  16  Height:      Weight:      SpO2:  96% 98% 97%    General: pleasant/cooperative Cardiovascular: rrr Respiratory: clear anterior  Discharge Instructions      Discharge Orders   Future Orders Complete By Expires     Diet - low sodium heart healthy  As directed     Discharge instructions  As directed     Comments:      Monitor blood pressure NWB left leg Knee immobilizer at all times except to bathe the leg to allow the knee to heal Follow up in 2 weeks at [facility]. CBC, BMP in 1 wek    Increase activity slowly  As directed         Medication  List    STOP taking these medications       losartan-hydrochlorothiazide 100-25 MG per tablet  Commonly known as:  HYZAAR     traMADol 50 MG tablet  Commonly known as:  ULTRAM      TAKE these medications       aspirin 325 MG tablet  Take 325 mg by mouth daily with breakfast.     celecoxib 200 MG capsule  Commonly known as:  CELEBREX  Take 200 mg by mouth every evening.     DSS 100 MG Caps  Take 100 mg by mouth 2 (two) times daily.     DULoxetine 60 MG capsule  Commonly known as:  CYMBALTA  Take 60 mg by mouth every morning.     enoxaparin 40 MG/0.4ML injection  Commonly known as:  LOVENOX  Inject 0.4 mLs (40 mg total) into the skin daily.     gabapentin 600 MG tablet  Commonly known as:  NEURONTIN  Take 600 mg by mouth 3 (three) times daily.      gabapentin 300 MG capsule  Commonly known as:  NEURONTIN  Take 300 mg by mouth 3 (three) times daily as needed. Nerve pain     hydrochlorothiazide 25 MG tablet  Commonly known as:  HYDRODIURIL  Take 1 tablet (25 mg total) by mouth daily.     HYDROcodone-acetaminophen 5-325 MG per tablet  Commonly known as:  NORCO/VICODIN  Take 1-2 tablets by mouth every 4 (four) hours as needed.     levothyroxine 75 MCG tablet  Commonly known as:  SYNTHROID, LEVOTHROID  Take 75 mcg by mouth daily.     lidocaine 5 %  Commonly known as:  LIDODERM  Place 1 patch onto the skin daily. Remove & Discard patch within 12 hours or as directed by MD     losartan 100 MG tablet  Commonly known as:  COZAAR  Take 1 tablet (100 mg total) by mouth daily.     metFORMIN 1000 MG tablet  Commonly known as:  GLUCOPHAGE  Take 1,000 mg by mouth 2 (two) times daily with a meal.     metoprolol tartrate 12.5 mg Tabs  Commonly known as:  LOPRESSOR  Take 0.5 tablets (12.5 mg total) by mouth 2 (two) times daily.     polyethylene glycol packet  Commonly known as:  MIRALAX / GLYCOLAX  Take 17 g by mouth daily as needed.     rOPINIRole 2 MG tablet  Commonly known as:  REQUIP  Take 2 mg by mouth at bedtime.     Vitamin D (Ergocalciferol) 50000 UNITS Caps  Commonly known as:  DRISDOL  Take 50,000 Units by mouth 2 (two) times a week. Saturday       Follow-up Information   Follow up with Shelda Pal, MD In 2 weeks.   Contact information:   [address], [address] [place] [address] [phone]       Follow up with PCP once released from SNF.       The results of significant diagnostics from this hospitalization (including imaging, microbiology, ancillary and laboratory) are listed below for reference.    Significant Diagnostic Studies: Dg Wrist Complete Right  12/26/2012  *RADIOLOGY REPORT*  Clinical Data: Fall.  Wrist pain  RIGHT WRIST - COMPLETE 3+ VIEW  Comparison:  None  Findings: The bones are diffusely osteopenic.  There is no acute fracture or subluxation identified.  Advanced osteoarthritis is noted at the basilar  joint.  IMPRESSION:  1.  Basilar joint osteoarthritis. 2.  Osteopenia.   Original Report Authenticated By: Signa Kell, M.D.    Ct Knee Left Wo Contrast  12/27/2012  *RADIOLOGY REPORT*  Clinical Data: Knee pain status post fall 1 day ago.  Evaluate distal femur fracture.  CT OF THE LEFT KNEE WITHOUT CONTRAST  Technique:  Multidetector CT imaging was performed according to the standard protocol. Multiplanar CT image reconstructions were also generated.  Comparison: Radiographs 12/26/2012.  Findings: There is a mildly displaced intra-articular fracture involving the medial femoral condyle.  This fracture demonstrates intercondylar extension and involves the posteromedial cortex of the metaphysis.  There is mild involvement of the medial femoral condyle anteriorly with 3 mm of cortical step off at the articular surface anteriorly, best seen on the reformatted images.  No involvement of the lateral femoral condyle is identified.  The patella, proximal tibia and proximal fibula are intact.  There are advanced tricompartmental degenerative changes with osteophytes.  There is subchondral sclerosis in the medial and femoral compartments.  The bones appear diffusely demineralized.  There is a large hemarthrosis.  A small amount of layering fat is present within a moderately distended Baker's cyst.  IMPRESSION:  1.  Mildly displaced intra-articular fracture of the medial femoral condyle with intercondylar extension as described. 2.  Associated large lipohemarthrosis. 3.  Underlying advanced tricompartmental degenerative changes.   Original Report Authenticated By: Carey Bullocks, M.D.    Dg Knee Complete 4 Views Left  12/26/2012  *RADIOLOGY REPORT*  Clinical Data: Fall.  Knee pain.  LEFT KNEE - COMPLETE 4+ VIEW  Comparison: None  Findings: Moderate sized joint  effusion is identified.  There is a comminuted intra-articular fracture deformity involving the distal femur.  The fracture fragments appear to extend from the distal medial femoral metaphysis into the femoral notch.  The fracture is likely comminuted.  There is moderate to advanced tricompartment osteoarthritis with sharpening of the tibial spines, exuberant marginal spur formation and joint space narrowing.femoral  IMPRESSION:  1.  Comminuted intra-articular fracture of the distal femur. Advise further evaluation with noncontrast CT ofthe knee 2.  Joint effusion. 3.  Osteoarthritis.   Original Report Authenticated By: Signa Kell, M.D.     Microbiology: Recent Results (from the past 240 hour(s))  URINE CULTURE     Status: None   Collection Time    12/26/12  5:08 PM      Result Value Range Status   Specimen Description URINE, CATHETERIZED   Final   Special Requests NONE   Final   Culture  Setup Time 12/27/2012 01:42   Final   Colony Count >=100,000 COLONIES/ML   Final   Culture GRAM NEGATIVE RODS   Final   Report Status PENDING   Incomplete     Labs: Basic Metabolic Panel:  Recent Labs Lab 12/26/12 1533 12/27/12 0225  NA 135 134*  K 4.1 3.8  CL 98 99  CO2 27 26  GLUCOSE 130* 140*  BUN 17 14  CREATININE 0.77 0.68  CALCIUM 9.3 8.8   Liver Function Tests:  Recent Labs Lab 12/26/12 1533  AST 16  ALT 11  ALKPHOS 67  BILITOT 0.5  PROT 6.5  ALBUMIN 3.2*   No results found for this basename: LIPASE, AMYLASE,  in the last 168 hours No results found for this basename: AMMONIA,  in the last 168 hours CBC:  Recent Labs Lab 12/26/12 1533 12/27/12 0225  WBC 9.7 9.2  NEUTROABS 6.7  --  HGB 12.8 12.4  HCT 38.2 37.1  MCV 88.6 88.5  PLT 420* 436*   Cardiac Enzymes:  Recent Labs Lab 12/26/12 2051 12/27/12 0225 12/27/12 0845  TROPONINI <0.30 <0.30 <0.30   BNP: BNP (last 3 results) No results found for this basename: PROBNP,  in the last 8760 hours CBG: No  results found for this basename: GLUCAP,  in the last 168 hours     Signed:  Marlin Canary  [facility] 12/29/2012, 8:54 AM

## 2012-12-28 NOTE — Progress Notes (Signed)
Family has chosen [facility]. Paperwork signed today. CSW faxed med rec to [facility]. Patient will be discharged tomorrow.  Keeli Roberg C. Shaylon Gillean MSW, LCSW [phone]

## 2012-12-28 NOTE — Progress Notes (Signed)
Clinical Social Work Department BRIEF PSYCHOSOCIAL ASSESSMENT 12/28/2012  Patient:  LUDENE, STOKKE     Account Number:  [id number]     Admit date:  12/26/2012  Clinical Social Worker:  Doree Albee  Date/Time:  12/26/2012 07:00 PM  Referred by:  RN  Date Referred:  12/26/2012 Referred for  SNF Placement   Other Referral:   Interview type:  Patient Other interview type:   and family    PSYCHOSOCIAL DATA Living Status:  ALONE Admitted from facility:   Level of care:   Primary support name:  Janyth Contes Primary support relationship to patient:  CHILD, ADULT Degree of support available:   strong    CURRENT CONCERNS Current Concerns  Post-Acute Placement   Other Concerns:    SOCIAL WORK ASSESSMENT / PLAN CSW met with pt and pt family at bedside to complete psychosocial assessment. Pt lives at home alone and due to fall at home may need snf for short term rehab. Patient family interested in [facility] skilled nursing, with possible preference for [facility].    CSW will complete fl2 for md signature and place in shadow chart when completed.    CSW explained csw role and process for physical therapy and snf placement search. Pt and pt family are motivated for patient to complete rehab and reutrn home with home health once approrpriate.   Assessment/plan status:  Psychosocial Support/Ongoing Assessment of Needs Other assessment/ plan:   Information/referral to community resources:   skilled nursing facility list    PATIENT'S/FAMILY'S RESPONSE TO PLAN OF CARE: Pt and pt family thanked csw for concern and support. Pt is motivated to complete short term rehab if recommended and return home with home health.

## 2012-12-28 NOTE — Progress Notes (Signed)
Clinical Social Work Department CLINICAL SOCIAL WORK PLACEMENT NOTE 12/28/2012  Patient:  Kelli Pena, Kelli Pena  Account Number:  [id number] Admit date:  12/26/2012  Clinical Social Worker:  Becky Sax, LCSW  Date/time:  12/28/2012 12:00 M  Clinical Social Work is seeking post-discharge placement for this patient at the following level of care:   SKILLED NURSING   (*CSW will update this form in Epic as items are completed)   12/28/2012  Patient/family provided with [facility] Department of Clinical Social Work's list of facilities offering this level of care within the geographic area requested by the patient (or if unable, by the patient's family).  12/28/2012  Patient/family informed of their freedom to choose among providers that offer the needed level of care, that participate in Medicare, Medicaid or managed care program needed by the patient, have an available bed and are willing to accept the patient.  12/28/2012  Patient/family informed of [facility]' ownership interest in [facility], as well as of the fact that they are under no obligation to receive care at this facility.  PASARR submitted to EDS on 12/28/2012 PASARR number received from EDS on 12/28/2012  FL2 transmitted to all facilities in geographic area requested by pt/family on  12/28/2012 FL2 transmitted to all facilities within larger geographic area on 12/28/2012  Patient informed that his/her managed care company has contracts with or will negotiate with  certain facilities, including the following:     Patient/family informed of bed offers received:  12/28/2012 Patient chooses bed at [facility] Physician recommends and patient chooses bed at  [facility]  Patient to be transferred to [facility] on  12/29/2012 Patient to be transferred to facility by ptar  The following physician request were entered in Epic:   Additional Comments:

## 2012-12-28 NOTE — Progress Notes (Signed)
CSW spoke with patient's daughter. She requested info be faxed to [facility] and [facility]. Will meet this afternoon for choice.  Kelli Pena MSW, LCSW [phone]

## 2012-12-28 NOTE — Progress Notes (Signed)
[facility] PROGRESS NOTE  Kelli Pena ZOX:[id number] DOB: February 06, 1920 [name]: 12/26/2012 PCP: Shelda Pal, MD  Assessment/Plan: Minimally displaced left distal femur fracture: Knee immobilizer to LLE, keep well padded and perform skin checks daily. OK for bed to chair transfers. Non weight bearing LLE  Right wrist sprain: splint PRN  F/up with ortho in 2-3 weeks for repeat x rays  -PT/OT:SNF   Dizziness/fall- place on tele ok, orthostatics ok, TSH ok, troponins ok- on multiple medications that can cause diziness  UTI- rocephin day #3- await culture  HTN- continue home meds- add metoprolol as BP still high  Pain- controlled with PO      Code Status: full Family Communication:  Disposition Plan: SNF   Consultants:  Charlann Boxer (ortho)  Procedures:    Antibiotics:  Rocephin 2/6  HPI/Subjective: Still some throbbing in knee C/o pain in left shoulder  Objective: Filed Vitals:   12/27/12 1443 12/28/12 0519 12/28/12 0552 12/28/12 0654  BP: 152/64 181/65 183/55 149/56  Pulse: 90 96    Temp: 98 F (36.7 C) 98 F (36.7 C)    TempSrc: Oral Oral    Resp: 16 16    Height:      Weight:      SpO2: 95% 96%      Intake/Output Summary (Last 24 hours) at 12/28/12 0851 Last data filed at 12/28/12 0600  Gross per 24 hour  Intake   2320 ml  Output   1050 ml  Net   1270 ml   Filed Weights   12/26/12 2045  Weight: 70.6 kg (155 lb 10.3 oz)    Exam:   General:  Pleasant/cooperative  Cardiovascular: rrr  Respiratory: clear anterior, no wheezing  Abdomen: +BS, soft, NT/ND  Data Reviewed: Basic Metabolic Panel:  Lab 12/27/12 8119 12/26/12 1533  NA 134* 135  K 3.8 4.1  CL 99 98  CO2 26 27  GLUCOSE 140* 130*  BUN 14 17  CREATININE 0.68 0.77  CALCIUM 8.8 9.3  MG -- --  PHOS -- --   Liver Function Tests:  Lab 12/26/12 1533  AST 16  ALT 11  ALKPHOS 67  BILITOT 0.5  PROT 6.5  ALBUMIN 3.2*   No results found for this basename: LIPASE:5,AMYLASE:5  in the last 168 hours No results found for this basename: AMMONIA:5 in the last 168 hours CBC:  Lab 12/27/12 0225 12/26/12 1533  WBC 9.2 9.7  NEUTROABS -- 6.7  HGB 12.4 12.8  HCT 37.1 38.2  MCV 88.5 88.6  PLT 436* 420*   Cardiac Enzymes:  Lab 12/27/12 0845 12/27/12 0225 12/26/12 2051  CKTOTAL -- -- --  CKMB -- -- --  CKMBINDEX -- -- --  TROPONINI <0.30 <0.30 <0.30   BNP (last 3 results) No results found for this basename: PROBNP:3 in the last 8760 hours CBG: No results found for this basename: GLUCAP:5 in the last 168 hours  No results found for this or any previous visit (from the past 240 hour(s)).   Studies: Dg Wrist Complete Right  12/26/2012  *RADIOLOGY REPORT*  Clinical Data: Fall.  Wrist pain  RIGHT WRIST - COMPLETE 3+ VIEW  Comparison: None  Findings: The bones are diffusely osteopenic.  There is no acute fracture or subluxation identified.  Advanced osteoarthritis is noted at the basilar joint.  IMPRESSION:  1.  Basilar joint osteoarthritis. 2.  Osteopenia.   Original Report Authenticated By: Signa Kell, M.D.    Ct Knee Left Wo Contrast  12/27/2012  *RADIOLOGY REPORT*  Clinical Data: Knee pain status post fall 1 day ago.  Evaluate distal femur fracture.  CT OF THE LEFT KNEE WITHOUT CONTRAST  Technique:  Multidetector CT imaging was performed according to the standard protocol. Multiplanar CT image reconstructions were also generated.  Comparison: Radiographs 12/26/2012.  Findings: There is a mildly displaced intra-articular fracture involving the medial femoral condyle.  This fracture demonstrates intercondylar extension and involves the posteromedial cortex of the metaphysis.  There is mild involvement of the medial femoral condyle anteriorly with 3 mm of cortical step off at the articular surface anteriorly, best seen on the reformatted images.  No involvement of the lateral femoral condyle is identified.  The patella, proximal tibia and proximal fibula are intact.  There  are advanced tricompartmental degenerative changes with osteophytes.  There is subchondral sclerosis in the medial and femoral compartments.  The bones appear diffusely demineralized.  There is a large hemarthrosis.  A small amount of layering fat is present within a moderately distended Baker's cyst.  IMPRESSION:  1.  Mildly displaced intra-articular fracture of the medial femoral condyle with intercondylar extension as described. 2.  Associated large lipohemarthrosis. 3.  Underlying advanced tricompartmental degenerative changes.   Original Report Authenticated By: Carey Bullocks, M.D.    Dg Knee Complete 4 Views Left  12/26/2012  *RADIOLOGY REPORT*  Clinical Data: Fall.  Knee pain.  LEFT KNEE - COMPLETE 4+ VIEW  Comparison: None  Findings: Moderate sized joint effusion is identified.  There is a comminuted intra-articular fracture deformity involving the distal femur.  The fracture fragments appear to extend from the distal medial femoral metaphysis into the femoral notch.  The fracture is likely comminuted.  There is moderate to advanced tricompartment osteoarthritis with sharpening of the tibial spines, exuberant marginal spur formation and joint space narrowing.femoral  IMPRESSION:  1.  Comminuted intra-articular fracture of the distal femur. Advise further evaluation with noncontrast CT ofthe knee 2.  Joint effusion. 3.  Osteoarthritis.   Original Report Authenticated By: Signa Kell, M.D.     Scheduled Meds:    . aspirin  325 mg Oral Q breakfast  . cefTRIAXone (ROCEPHIN)  IV  1 g Intravenous Q24H  . celecoxib  200 mg Oral QPM  . docusate sodium  100 mg Oral BID  . DULoxetine  60 mg Oral Daily  . enoxaparin (LOVENOX) injection  40 mg Subcutaneous Q24H  . gabapentin  600 mg Oral TID  . hydrochlorothiazide  25 mg Oral Daily  . levothyroxine  75 mcg Oral QAC breakfast  . losartan  100 mg Oral Daily  . rOPINIRole  2 mg Oral QHS  . sodium chloride  3 mL Intravenous Q12H  . Vitamin D  (Ergocalciferol)  50,000 Units Oral 2 times weekly   Continuous Infusions:    . sodium chloride 50 mL/hr at 12/27/12 1643    Active Problems:  UTI (lower urinary tract infection)  Dizziness  Fall  Femur fracture    Time spent: 25    Marlin Canary  [facility] Pager [phone]. If 8PM-8AM, please contact night-coverage at [email or web address], password [facility] 12/28/2012, 8:51 AM  LOS: 2 days

## 2012-12-28 NOTE — Progress Notes (Signed)
   Subjective: Mildly displaced intra-articular fracture involving the medial femoral condyle left knee  Patient reports pain as mild, well controlled.   Objective:   VITALS:   Filed Vitals:   12/28/12 1329  BP: 147/65  Pulse: 82  Temp: 98.2 F (36.8 C)  Resp: 18    Neurovascular intact Dorsiflexion/Plantar flexion intact No cellulitis present Compartment soft  LABS  Basename 12/27/12 0225 12/26/12 1533  HGB 12.4 12.8  HCT 37.1 38.2  WBC 9.2 9.7  PLT 436* 420*     Basename 12/27/12 0225 12/26/12 1533  NA 134* 135  K 3.8 4.1  BUN 14 17  CREATININE 0.68 0.77  GLUCOSE 140* 130*     Assessment/Plan: Mildly displaced intra-articular fracture involving the medial femoral condyle left knee Orthopaedically stable Discussion was had with daughter regarding situation and it has been decided to wait on any surgery at this time. Can be discharged back to SNF when ready medically NWB left leg Knee immobilizer at all times except to bathe the leg to allow the knee to heal Follow up in 2 weeks at [facility] Follow up with OLIN,Lailani Tool D in 2 weeks.  Contact information:  [facility] [address] [place] [address] [phone]         Anastasio Auerbach. Kelli Pena   PAC  12/28/2012, 1:30 PM

## 2012-12-29 LAB — URINE CULTURE

## 2012-12-29 MED ORDER — HYDROCODONE-ACETAMINOPHEN 5-325 MG PO TABS: 1.0000 | ORAL_TABLET | ORAL | Status: DC | PRN

## 2012-12-29 NOTE — Progress Notes (Signed)
Pt to be d/c today to [facility]   Pt and family agreeable. Confirmed plans with facility.  Plan transfer via EMS.   Myliah Medel, LCSWA  Weekend Coverage [phone]   

## 2012-12-29 NOTE — Progress Notes (Signed)
Physical Therapy Treatment Patient Details Name: Kelli Pena MRN: [id number] DOB: October 16, 1920 Today's Date: 12/29/2012 Time: 4098-1191 PT Time Calculation (min): 26 min  PT Assessment / Plan / Recommendation Comments on Treatment Session  Pt. with c/o decreased pain of L leg today. Pt. tries very hard. to go to SNF today/    Follow Up Recommendations  SNF     Does the patient have the potential to tolerate intense rehabilitation     Barriers to Discharge        Equipment Recommendations  None recommended by PT    Recommendations for Other Services    Frequency Min 3X/week   Plan Discharge plan remains appropriate;Frequency remains appropriate    Precautions / Restrictions Precautions Precautions: Fall Required Braces or Orthoses: Knee Immobilizer - Left Knee Immobilizer - Left: On at all times Restrictions Weight Bearing Restrictions: Yes LLE Weight Bearing: Non weight bearing   Pertinent Vitals/Pain     Mobility  Bed Mobility Bed Mobility: Sit to Supine Supine to Sit: 1: +2 Total assist Supine to Sit: Patient Percentage: 30% Sit to Supine: 1: +2 Total assist Sit to Supine: Patient Percentage: 10% Details for Bed Mobility Assistance: assistance for L leg support. KI adjusted and pad placed at back of lowere leg to pad stays. Transfers Transfers: Sit to Stand;Stand to Sit Sit to Stand: 1: +2 Total assist;From bed;With upper extremity assist Sit to Stand: Patient Percentage: 40% Stand to Sit: 1: +2 Total assist Stand to Sit: Patient Percentage: 40% Stand Pivot Transfers: Not tested (comment) Details for Transfer Assistance: pt assisted to stand at RW, Support given to LLE to prevent weight on L leg. RW was not optimal height for pt. so standing was difficult.    Exercises     PT Diagnosis:    PT Problem List:   PT Treatment Interventions:     PT Goals Acute Rehab PT Goals PT Goal Formulation: With patient Time For Goal Achievement: 01/10/13 Potential to  Achieve Goals: Good Pt will go Supine/Side to Sit: with supervision PT Goal: Supine/Side to Sit - Progress: Progressing toward goal Pt will go Sit to Supine/Side: with supervision PT Goal: Sit to Supine/Side - Progress: Progressing toward goal Pt will go Sit to Stand: with supervision PT Goal: Sit to Stand - Progress: Progressing toward goal Pt will go Stand to Sit: with supervision PT Goal: Stand to Sit - Progress: Progressing toward goal Pt will Transfer Bed to Chair/Chair to Bed: with supervision PT Transfer Goal: Bed to Chair/Chair to Bed - Progress: Progressing toward goal Pt will Propel Wheelchair: 51 - 150 feet;with modified independence PT Goal: Propel Wheelchair - Progress: Progressing toward goal  Visit Information  Last PT Received On: 12/29/12 Assistance Needed: +2    Subjective Data  Subjective: I can't put any weight on my l;eg   Cognition  Cognition Overall Cognitive Status: Appears within functional limits for tasks assessed/performed    Balance     End of Session PT - End of Session Equipment Utilized During Treatment: Gait belt;Left knee immobilizer Activity Tolerance: Patient tolerated treatment well Patient left: in bed;with call bell/phone within reach Nurse Communication: Mobility status   GP     Rada Hay 12/29/2012, 11:43 AM

## 2013-02-13 ENCOUNTER — Other Ambulatory Visit: Payer: Self-pay | Admitting: Geriatric Medicine

## 2013-02-13 MED ORDER — HYDROCODONE-ACETAMINOPHEN 5-325 MG PO TABS: ORAL_TABLET | ORAL | Status: DC

## 2013-03-19 ENCOUNTER — Non-Acute Institutional Stay (SKILLED_NURSING_FACILITY): Payer: Medicare Other | Admitting: Internal Medicine

## 2013-03-19 DIAGNOSIS — S8290XS Unspecified fracture of unspecified lower leg, sequela: Secondary | ICD-10-CM

## 2013-03-19 DIAGNOSIS — E039 Hypothyroidism, unspecified: Secondary | ICD-10-CM

## 2013-03-19 DIAGNOSIS — I10 Essential (primary) hypertension: Secondary | ICD-10-CM

## 2013-03-19 DIAGNOSIS — S7292XS Unspecified fracture of left femur, sequela: Secondary | ICD-10-CM

## 2013-03-19 DIAGNOSIS — K5900 Constipation, unspecified: Secondary | ICD-10-CM

## 2013-03-21 ENCOUNTER — Non-Acute Institutional Stay (SKILLED_NURSING_FACILITY): Payer: Medicare Other | Admitting: Adult Health

## 2013-03-21 DIAGNOSIS — F3289 Other specified depressive episodes: Secondary | ICD-10-CM

## 2013-03-21 DIAGNOSIS — F329 Major depressive disorder, single episode, unspecified: Secondary | ICD-10-CM

## 2013-03-21 DIAGNOSIS — S7290XD Unspecified fracture of unspecified femur, subsequent encounter for closed fracture with routine healing: Secondary | ICD-10-CM

## 2013-03-21 DIAGNOSIS — S7292XD Unspecified fracture of left femur, subsequent encounter for closed fracture with routine healing: Secondary | ICD-10-CM

## 2013-03-21 DIAGNOSIS — G589 Mononeuropathy, unspecified: Secondary | ICD-10-CM

## 2013-03-21 DIAGNOSIS — I10 Essential (primary) hypertension: Secondary | ICD-10-CM

## 2013-03-21 DIAGNOSIS — G629 Polyneuropathy, unspecified: Secondary | ICD-10-CM

## 2013-03-21 DIAGNOSIS — E119 Type 2 diabetes mellitus without complications: Secondary | ICD-10-CM

## 2013-03-21 DIAGNOSIS — F32A Depression, unspecified: Secondary | ICD-10-CM

## 2013-03-21 DIAGNOSIS — G2581 Restless legs syndrome: Secondary | ICD-10-CM

## 2013-03-21 DIAGNOSIS — E039 Hypothyroidism, unspecified: Secondary | ICD-10-CM

## 2013-03-21 DIAGNOSIS — K5900 Constipation, unspecified: Secondary | ICD-10-CM

## 2013-04-02 DIAGNOSIS — I10 Essential (primary) hypertension: Secondary | ICD-10-CM | POA: Insufficient documentation

## 2013-04-02 DIAGNOSIS — K5900 Constipation, unspecified: Secondary | ICD-10-CM | POA: Insufficient documentation

## 2013-04-02 DIAGNOSIS — E039 Hypothyroidism, unspecified: Secondary | ICD-10-CM | POA: Insufficient documentation

## 2013-04-02 NOTE — Progress Notes (Signed)
Patient ID: Kelli Pena, female   DOB: 04-Sep-1920, 79 y.o.   MRN: [id number]        PROGRESS NOTE  DATE: 03/19/2013  FACILITY: Nursing Home Location: [facility]  LEVEL OF CARE: SNF (31)  Routine Visit   CHIEF COMPLAINT:  Manage hypertension, hypothyroidism and constipation.    HISTORY OF PRESENT ILLNESS:  REASSESSMENT OF ONGOING PROBLEM(S):  CONSTIPATION: The constipation remains stable. No complications from the medications presently being used. Patient denies ongoing constipation, abdominal pain, nausea or vomiting.   HTN: Pt 's HTN remains stable.  Denies CP, sob, DOE, pedal edema, headaches, dizziness or visual disturbances.  No complications from the medications currently being used.  Last BP : 112/72, 102/57, 110/61.  HYPOTHYROIDISM: The hypothyroidism remains stable. No complications noted from the medications presently being used.  The patient denies fatigue or constipation.  Last TSH:  A recent TSH is not available.   PAST MEDICAL HISTORY : Reviewed.  No changes.  CURRENT MEDICATIONS: Reviewed per [facility]  REVIEW OF SYSTEMS:  GENERAL: no change in appetite, no fatigue, no weight changes, no fever, chills or weakness RESPIRATORY: no cough, SOB, DOE, wheezing, hemoptysis CARDIAC: no chest pain, edema or palpitations GI: no abdominal pain, diarrhea, constipation, heart burn, nausea or vomiting  PHYSICAL EXAMINATION  VS:  T 99.2      P 88      RR 20     BP 112/72    POX %     WT (Lb)  GENERAL: no acute distress, normal body habitus EYES: conjunctivae normal, sclerae normal, normal eye lids NECK: supple, trachea midline, no neck masses, no thyroid tenderness, no thyromegaly LYMPHATICS: no LAN in the neck, no supraclavicular LAN RESPIRATORY: breathing is even & unlabored, BS CTAB CARDIAC: RRR, no murmur,no extra heart sounds, no edema GI: abdomen soft, normal BS, no masses, no tenderness, no hepatomegaly, no splenomegaly PSYCHIATRIC: the patient is  alert & oriented to person, affect & behavior appropriate  LABS/RADIOLOGY: 12/2012:  Platelet count 829, otherwise CBC normal.    Glucose 163, calcium 10.7, otherwise BMP normal.    ASSESSMENT/PLAN:  Hypothyroidism.  Check TSH.   Constipation.  Well controlled.   Hypertension.  Well controlled.    Left femur fracture.  Status post repair.   Currently undergoing rehabilitation.   Depression.  Continue Cymbalta.  Denies ongoing symptoms.    Arthritis.  No complaints of pain.    Diabetes mellitus.  Continue Glucophage.  Check  hemoglobin A1C.   Check liver profile.    CPT CODE: 40981

## 2013-04-05 ENCOUNTER — Encounter: Payer: Self-pay | Admitting: Adult Health

## 2013-04-05 DIAGNOSIS — S7292XA Unspecified fracture of left femur, initial encounter for closed fracture: Secondary | ICD-10-CM | POA: Insufficient documentation

## 2013-04-05 DIAGNOSIS — G629 Polyneuropathy, unspecified: Secondary | ICD-10-CM | POA: Insufficient documentation

## 2013-04-05 DIAGNOSIS — E119 Type 2 diabetes mellitus without complications: Secondary | ICD-10-CM | POA: Insufficient documentation

## 2013-04-05 DIAGNOSIS — G2581 Restless legs syndrome: Secondary | ICD-10-CM | POA: Insufficient documentation

## 2013-04-05 DIAGNOSIS — F329 Major depressive disorder, single episode, unspecified: Secondary | ICD-10-CM | POA: Insufficient documentation

## 2013-04-05 NOTE — Progress Notes (Signed)
  Subjective:    Patient ID: Kelli Pena, female    DOB: Sep 12, 1920, 78 y.o.   MRN: [id number]  HPI This is a 79 year old female who is for discharge home with Home health PT, OT, Nursing and CNA. She has been admitted to [facility] on 12/29/12 from [facility] with minimally displaced femur fracture sustained from a fall. She has been admitted for a short-term rehabilitation   Review of Systems  Constitutional: Negative.   HENT: Negative.   Eyes: Negative.   Respiratory: Negative for cough and shortness of breath.   Cardiovascular: Negative for leg swelling.  Gastrointestinal: Negative.   Endocrine: Negative.   Genitourinary: Negative.   Neurological: Negative.   Hematological: Negative for adenopathy. Does not bruise/bleed easily.  Psychiatric/Behavioral: Negative.        Objective:   Physical Exam  Nursing note and vitals reviewed. Constitutional: She is oriented to person, place, and time. She appears well-developed and well-nourished.  HENT:  Head: Normocephalic and atraumatic.  Right Ear: External ear normal.  Left Ear: External ear normal.  Eyes: Conjunctivae and EOM are normal.  Neck: Normal range of motion. Neck supple. No thyromegaly present.  Cardiovascular: Normal rate, regular rhythm and normal heart sounds.   Pulmonary/Chest: Effort normal and breath sounds normal.  Abdominal: Soft. Bowel sounds are normal.  Musculoskeletal: She exhibits no edema and no tenderness.  Neurological: She is alert and oriented to person, place, and time.  Skin: Skin is warm and dry.  Psychiatric: She has a normal mood and affect. Her behavior is normal. Judgment and thought content normal.   LABS:  4/14  Liver profile nl except total protein 5.6  hgbA1c 5.8 2/14  Cbc nl except platelet count 829  NA 132  K 4.4  Glucose 163  BUN 17  Creatinine 0.72   Calcium 10.7  Medications reviewed per [facility]    Assessment & Plan:  Diabetes mellitus  - latest hgbA1c 5.8;  discontinue Glucophage  Depression - stable; continue Cymbalta  Neuropathy - stable; continue Neurontin  Restless leg syndrome - stable; continue Requip  Femur fracture, left - for Home health PT, OT, Nursing and CNA follow-up  Unspecified hypothyroidism - stable; continue Synthroid  Unspecified constipation - no complaints of; continue Miralax  Essential hypertension, benign - well-controlled; continue Cozaar, HCTZ and Lopressor

## 2013-04-24 ENCOUNTER — Other Ambulatory Visit: Payer: Self-pay | Admitting: Orthopedic Surgery

## 2013-04-24 DIAGNOSIS — S72452A Displaced supracondylar fracture without intracondylar extension of lower end of left femur, initial encounter for closed fracture: Secondary | ICD-10-CM

## 2013-04-29 ENCOUNTER — Ambulatory Visit
Admission: RE | Admit: 2013-04-29 | Discharge: 2013-04-29 | Disposition: A | Discharge status: Home / Self Care | Payer: Medicare Other | Source: Ambulatory Visit | Attending: Orthopedic Surgery | Admitting: Orthopedic Surgery

## 2013-04-29 DIAGNOSIS — S72452A Displaced supracondylar fracture without intracondylar extension of lower end of left femur, initial encounter for closed fracture: Secondary | ICD-10-CM

## 2013-05-09 ENCOUNTER — Ambulatory Visit (INDEPENDENT_AMBULATORY_CARE_PROVIDER_SITE_OTHER): Payer: Medicare Other | Admitting: Family Medicine

## 2013-05-09 ENCOUNTER — Other Ambulatory Visit: Payer: Self-pay | Admitting: Family Medicine

## 2013-05-09 VITALS — BP 134/73 | HR 66

## 2013-05-09 DIAGNOSIS — G2581 Restless legs syndrome: Secondary | ICD-10-CM

## 2013-05-09 DIAGNOSIS — E559 Vitamin D deficiency, unspecified: Secondary | ICD-10-CM

## 2013-05-09 DIAGNOSIS — F411 Generalized anxiety disorder: Secondary | ICD-10-CM

## 2013-05-09 DIAGNOSIS — G629 Polyneuropathy, unspecified: Secondary | ICD-10-CM

## 2013-05-09 DIAGNOSIS — G894 Chronic pain syndrome: Secondary | ICD-10-CM

## 2013-05-09 DIAGNOSIS — M1990 Unspecified osteoarthritis, unspecified site: Secondary | ICD-10-CM

## 2013-05-09 DIAGNOSIS — I10 Essential (primary) hypertension: Secondary | ICD-10-CM

## 2013-05-09 DIAGNOSIS — E039 Hypothyroidism, unspecified: Secondary | ICD-10-CM

## 2013-05-09 DIAGNOSIS — G589 Mononeuropathy, unspecified: Secondary | ICD-10-CM

## 2013-05-09 MED ORDER — DULOXETINE HCL 60 MG PO CPEP: 60.0000 mg | ORAL_CAPSULE | ORAL | Status: DC

## 2013-05-09 MED ORDER — LEVOTHYROXINE SODIUM 75 MCG PO TABS: 75.0000 ug | ORAL_TABLET | Freq: Every day | ORAL | Status: DC

## 2013-05-09 MED ORDER — VITAMIN D (ERGOCALCIFEROL) 1.25 MG (50000 UNIT) PO CAPS: 50000.0000 [IU] | ORAL_CAPSULE | ORAL | Status: DC

## 2013-05-09 MED ORDER — GABAPENTIN 600 MG PO TABS: 600.0000 mg | ORAL_TABLET | Freq: Every day | ORAL | Status: DC

## 2013-05-09 MED ORDER — METOPROLOL SUCCINATE ER 50 MG PO TB24: ORAL_TABLET | ORAL | Status: DC

## 2013-05-09 MED ORDER — TRAMADOL HCL 50 MG PO TABS: 50.0000 mg | ORAL_TABLET | Freq: Four times a day (QID) | ORAL | Status: DC | PRN

## 2013-05-09 MED ORDER — METHOCARBAMOL 500 MG PO TABS: ORAL_TABLET | ORAL | Status: DC

## 2013-05-09 MED ORDER — LOSARTAN POTASSIUM 100 MG PO TABS: 100.0000 mg | ORAL_TABLET | Freq: Every day | ORAL | Status: DC

## 2013-05-09 MED ORDER — ROPINIROLE HCL 2 MG PO TABS: 2.0000 mg | ORAL_TABLET | Freq: Every day | ORAL | Status: DC

## 2013-05-09 MED ORDER — CLOTRIMAZOLE-BETAMETHASONE 1-0.05 % EX CREA: TOPICAL_CREAM | CUTANEOUS | Status: DC

## 2013-05-09 NOTE — Patient Instructions (Addendum)
1)  BP - Take losartan 100 mg daily and Toprol XL 50 mg daily.   Restless Legs Syndrome Restless legs syndrome is a movement disorder. It may also be called a sensori-motor disorder.  CAUSES  No one knows what specifically causes restless legs syndrome, but it tends to run in families. It is also more common in people with low iron, in pregnancy, in people who need dialysis, and those with nerve damage (neuropathy).Some medications may make restless legs syndrome worse.Those medications include drugs to treat high blood pressure, some heart conditions, nausea, colds, allergies, and depression. SYMPTOMS Symptoms include uncomfortable sensations in the legs. These leg sensations are worse during periods of inactivity or rest. They are also worse while sitting or lying down. Individuals that have the disorder describe sensations in the legs that feel like:  Pulling.  Drawing.  Crawling.  Worming.  Boring.  Tingling.  Pins and needles.  Prickling.  Pain. The sensations are usually accompanied by an overwhelming urge to move the legs. Sudden muscle jerks may also occur. Movement provides temporary relief from the discomfort. In rare cases, the arms may also be affected. Symptoms may interfere with going to sleep (sleep onset insomnia). Restless legs syndrome may also be related to periodic limb movement disorder (PLMD). PLMD is another more common motor disorder. It also causes interrupted sleep. The symptoms from PLMD usually occur most often when you are awake. TREATMENT  Treatment for restless legs syndrome is symptomatic. This means that the symptoms are treated.   Massage and cold compresses may provide temporary relief.  Walk, stretch, or take a cold or hot bath.  Get regular exercise and a good night's sleep.  Avoid caffeine, alcohol, nicotine, and medications that can make it worse.  Do activities that provide mental stimulation like discussions, needlework, and video  games. These may be helpful if you are not able to walk or stretch. Some medications are effective in relieving the symptoms. However, many of these medications have side effects. Ask your caregiver about medications that may help your symptoms. Correcting iron deficiency may improve symptoms for some patients. Document Released: 10/28/2002 Document Revised: 01/30/2012 Document Reviewed: 02/03/2011 [facility] Patient Information 2014 [place], [place].

## 2013-05-09 NOTE — Progress Notes (Signed)
  Subjective:    Patient ID: Kelli Pena, female    DOB: October 07, 1920, 79 y.o.   MRN: [id number]  HPI  [name] is here today with her daughter [name]) to discuss a few issues:    1)  Left Leg Fracture:  She fell and fractured her leg in 12/2012.  She was originally admitted to [facility] then was transferred to [facility] for rehabilitation.  She continues to see [name] for management of her pain.    2)  Medication Refills:  She was discharged from her PT facility and was only given one month refill on all her medications so she needs most of her medications refilled.    3)  Left Shoulder Pain:  She was recently told that she has small tears in her rotator cuff and severe arthritis.  She has been using the Lidoderm patches which have helped her.  She would like to get her Celebrex refilled.   4)  Type II DM:  Her daughter was told at [facility] that her A1C was very low so her metformin was stopped.     Review of Systems  Constitutional: Positive for activity change and fatigue.  HENT: Negative.   Eyes: Negative.   Respiratory: Negative for shortness of breath.   Cardiovascular: Negative for chest pain and palpitations.  Gastrointestinal: Negative.   Genitourinary: Negative.   Musculoskeletal: Positive for joint swelling and arthralgias.  Skin: Negative.   Psychiatric/Behavioral: Negative.     Past Medical History  Diagnosis Date  . Hypertension   . Diabetes mellitus without complication   . Arthritis   . Hypothyroid   . Fibromyalgia   . Complication of anesthesia     trouble waking up, very cold, hypotension   Family History  Problem Relation Age of Onset  . Stroke Father    History  Substance Use Topics  . Smoking status: Former Games developer  . Smokeless tobacco: Never Used  . Alcohol Use: Yes     Comment: RARE       Objective:   Physical Exam  Constitutional: She is oriented to person, place, and time. She appears well-nourished. No distress.  HENT:  Head:  Atraumatic.  Eyes: Conjunctivae are normal. No scleral icterus.  Neck: Neck supple. No thyromegaly present.  Cardiovascular: Normal rate, regular rhythm and normal heart sounds.   Pulmonary/Chest: Effort normal and breath sounds normal. No respiratory distress.  Abdominal: There is no tenderness.  Musculoskeletal: She exhibits tenderness.  Neurological: She is alert and oriented to person, place, and time.  Skin: Skin is warm and dry.  Psychiatric: She has a normal mood and affect. Her behavior is normal. Judgment and thought content normal.          Assessment & Plan:

## 2013-06-16 ENCOUNTER — Encounter: Payer: Self-pay | Admitting: Family Medicine

## 2013-06-16 DIAGNOSIS — I10 Essential (primary) hypertension: Secondary | ICD-10-CM | POA: Insufficient documentation

## 2013-06-16 DIAGNOSIS — F411 Generalized anxiety disorder: Secondary | ICD-10-CM | POA: Insufficient documentation

## 2013-06-16 DIAGNOSIS — M1990 Unspecified osteoarthritis, unspecified site: Secondary | ICD-10-CM | POA: Insufficient documentation

## 2013-06-16 DIAGNOSIS — E559 Vitamin D deficiency, unspecified: Secondary | ICD-10-CM | POA: Insufficient documentation

## 2013-06-16 MED ORDER — LIDOCAINE 5 % EX PTCH: 1.0000 | MEDICATED_PATCH | CUTANEOUS | Status: DC

## 2013-06-16 MED ORDER — CELECOXIB 200 MG PO CAPS: 200.0000 mg | ORAL_CAPSULE | Freq: Every day | ORAL | Status: DC

## 2013-06-16 NOTE — Assessment & Plan Note (Signed)
Refilled her Vitamin D.   

## 2013-06-16 NOTE — Assessment & Plan Note (Signed)
Refilled his Requip.

## 2013-06-16 NOTE — Assessment & Plan Note (Signed)
Refilled her Cymbalta which helps both her mood and her chronic pain.

## 2013-06-16 NOTE — Assessment & Plan Note (Signed)
Refilled her levothyroxine.   

## 2013-06-16 NOTE — Assessment & Plan Note (Signed)
Refilled her gabapentin 

## 2013-06-16 NOTE — Assessment & Plan Note (Signed)
Refilled her Celebrex and Lidoderm Patch

## 2013-06-16 NOTE — Assessment & Plan Note (Signed)
Refilled her losartan/HCT and Toprol XL.

## 2013-07-11 ENCOUNTER — Telehealth: Payer: Self-pay | Admitting: *Deleted

## 2013-07-12 NOTE — Telephone Encounter (Signed)
Patient needs an appointment

## 2013-07-17 ENCOUNTER — Encounter: Payer: Self-pay | Admitting: Family Medicine

## 2013-07-17 ENCOUNTER — Ambulatory Visit (INDEPENDENT_AMBULATORY_CARE_PROVIDER_SITE_OTHER): Payer: Medicare Other | Admitting: Family Medicine

## 2013-07-17 VITALS — BP 103/66 | HR 66 | Wt 149.0 lb

## 2013-07-17 DIAGNOSIS — G2581 Restless legs syndrome: Secondary | ICD-10-CM

## 2013-07-17 DIAGNOSIS — G894 Chronic pain syndrome: Secondary | ICD-10-CM

## 2013-07-17 DIAGNOSIS — M1990 Unspecified osteoarthritis, unspecified site: Secondary | ICD-10-CM

## 2013-07-17 DIAGNOSIS — Z23 Encounter for immunization: Secondary | ICD-10-CM | POA: Insufficient documentation

## 2013-07-17 MED ORDER — TRAMADOL HCL 50 MG PO TABS: 50.0000 mg | ORAL_TABLET | Freq: Four times a day (QID) | ORAL | Status: DC | PRN

## 2013-07-17 MED ORDER — LIDOCAINE 5 % EX PTCH: 3.0000 | MEDICATED_PATCH | CUTANEOUS | Status: DC

## 2013-07-17 MED ORDER — ROPINIROLE HCL 4 MG PO TABS: ORAL_TABLET | ORAL | Status: DC

## 2013-07-17 NOTE — Assessment & Plan Note (Signed)
The patient confirmed that they are not allergic to eggs and have never had a bad reaction with the flu shot in the past.  The vaccination was given without difficulty.   

## 2013-07-17 NOTE — Assessment & Plan Note (Signed)
We'll increase her Requip to 4 mg per day.  She will take 2 mg earlier in the day then 2 mg at bedtime.

## 2013-07-17 NOTE — Assessment & Plan Note (Signed)
Refilled her Ultracet.

## 2013-07-17 NOTE — Patient Instructions (Addendum)
1)  Restless Leg:  We increased your Requip to 4 mg.  Take 1/2 in the early afternoon and 1/2 at bedtime.    Restless Legs Syndrome Restless legs syndrome is a movement disorder. It may also be called a sensori-motor disorder.  CAUSES  No one knows what specifically causes restless legs syndrome, but it tends to run in families. It is also more common in people with low iron, in pregnancy, in people who need dialysis, and those with nerve damage (neuropathy).Some medications may make restless legs syndrome worse.Those medications include drugs to treat high blood pressure, some heart conditions, nausea, colds, allergies, and depression. SYMPTOMS Symptoms include uncomfortable sensations in the legs. These leg sensations are worse during periods of inactivity or rest. They are also worse while sitting or lying down. Individuals that have the disorder describe sensations in the legs that feel like:  Pulling.  Drawing.  Crawling.  Worming.  Boring.  Tingling.  Pins and needles.  Prickling.  Pain. The sensations are usually accompanied by an overwhelming urge to move the legs. Sudden muscle jerks may also occur. Movement provides temporary relief from the discomfort. In rare cases, the arms may also be affected. Symptoms may interfere with going to sleep (sleep onset insomnia). Restless legs syndrome may also be related to periodic limb movement disorder (PLMD). PLMD is another more common motor disorder. It also causes interrupted sleep. The symptoms from PLMD usually occur most often when you are awake. TREATMENT  Treatment for restless legs syndrome is symptomatic. This means that the symptoms are treated.   Massage and cold compresses may provide temporary relief.  Walk, stretch, or take a cold or hot bath.  Get regular exercise and a good night's sleep.  Avoid caffeine, alcohol, nicotine, and medications that can make it worse.  Do activities that provide mental stimulation  like discussions, needlework, and video games. These may be helpful if you are not able to walk or stretch. Some medications are effective in relieving the symptoms. However, many of these medications have side effects. Ask your caregiver about medications that may help your symptoms. Correcting iron deficiency may improve symptoms for some patients. Document Released: 10/28/2002 Document Revised: 01/30/2012 Document Reviewed: 02/03/2011 [facility] Patient Information 2014 [place], [place].

## 2013-07-17 NOTE — Assessment & Plan Note (Signed)
She was given a refill for her Lidoderm patch.

## 2013-07-17 NOTE — Progress Notes (Signed)
  Subjective:    Patient ID: Kelli Pena, female    DOB: 01/20/1920, 79 y.o.   MRN: [id number]  HPI  Kelli Pena is here today with her daughter Kelli Pena) to discuss the conditions listed below:   1)  Arthritis:  She takes tramadol and applies up to 3 Lidoderm patches daily.  She needs to have both of them refilled.  She says that the "creepy crawly" feeling she has in her legs returned since she has been off of the tramadol.    2)  RLS:  She takes Requip 2 mg and thinks that it helps but would like to get a higher dosage if she could.  She has symptoms both during the day and at night.      Review of Systems  Constitutional: Negative.   HENT: Negative.   Eyes: Negative.   Respiratory: Negative.   Cardiovascular: Negative.   Gastrointestinal: Negative.   Endocrine: Negative.   Genitourinary: Negative.   Musculoskeletal: Positive for joint swelling and arthralgias.       Well controlled with Tramadol and Lidoderm patches.   Allergic/Immunologic: Negative.   Neurological: Negative.   Hematological: Negative.   Psychiatric/Behavioral: Negative.      Past Medical History  Diagnosis Date  . Hypertension   . Diabetes mellitus without complication   . Arthritis   . Hypothyroid   . Fibromyalgia   . Complication of anesthesia     trouble waking up, very cold, hypotension    Past Surgical History  Procedure Laterality Date  . Hip surgery    . Cholecystectomy    . Eye surgery      Family History  Problem Relation Age of Onset  . Stroke Father     History   Social History Narrative   Marital Status: Widowed    Children: Daughter Oceanographer)   Pets:  Dog (1)    Living Situation: Lives alone.   Occupation: Retired Training and development officer)    Education: Engineer, agricultural    Tobacco Use/Exposure:  She smoked 1 - 1 1/2 ppd for 20 years. She quit in 1967.    Alcohol Use:  Rarely   Drug Use:  None   Diet:  Regular   Exercise:  None   Hobbies: Playing with Dog/ Taking Picture/ Reading                 Objective:   Physical Exam  Vitals reviewed. Constitutional: She is oriented to person, place, and time. She appears well-developed and well-nourished.  She is sitting in a wheelchair   HENT:  She has trouble hearing.  Cardiovascular: Normal rate and regular rhythm.   Pulmonary/Chest: Effort normal and breath sounds normal.  Musculoskeletal: She exhibits no edema.  Neurological: She is alert and oriented to person, place, and time.  Skin: Skin is warm and dry.  Psychiatric: She has a normal mood and affect. Her behavior is normal. Judgment and thought content normal.          Assessment & Plan:

## 2013-07-23 LAB — HM DEXA SCAN

## 2013-07-30 ENCOUNTER — Encounter: Payer: Self-pay | Admitting: *Deleted

## 2013-12-12 ENCOUNTER — Ambulatory Visit (HOSPITAL_BASED_OUTPATIENT_CLINIC_OR_DEPARTMENT_OTHER)
Admission: RE | Admit: 2013-12-12 | Discharge: 2013-12-12 | Disposition: A | Discharge status: Home / Self Care | Payer: Medicare Other | Source: Ambulatory Visit | Attending: Family Medicine | Admitting: Family Medicine

## 2013-12-12 ENCOUNTER — Ambulatory Visit (INDEPENDENT_AMBULATORY_CARE_PROVIDER_SITE_OTHER): Payer: Medicare Other | Admitting: Family Medicine

## 2013-12-12 ENCOUNTER — Encounter: Payer: Self-pay | Admitting: Family Medicine

## 2013-12-12 VITALS — BP 132/75 | HR 65 | Resp 16

## 2013-12-12 DIAGNOSIS — M79609 Pain in unspecified limb: Secondary | ICD-10-CM | POA: Insufficient documentation

## 2013-12-12 DIAGNOSIS — M259 Joint disorder, unspecified: Secondary | ICD-10-CM | POA: Insufficient documentation

## 2013-12-12 DIAGNOSIS — G894 Chronic pain syndrome: Secondary | ICD-10-CM

## 2013-12-12 DIAGNOSIS — M7989 Other specified soft tissue disorders: Secondary | ICD-10-CM | POA: Insufficient documentation

## 2013-12-12 DIAGNOSIS — E559 Vitamin D deficiency, unspecified: Secondary | ICD-10-CM

## 2013-12-12 MED ORDER — POTASSIUM CHLORIDE CRYS ER 10 MEQ PO TBCR: 10.0000 meq | EXTENDED_RELEASE_TABLET | Freq: Two times a day (BID) | ORAL | Status: DC

## 2013-12-12 MED ORDER — VITAMIN D (ERGOCALCIFEROL) 1.25 MG (50000 UNIT) PO CAPS: 50000.0000 [IU] | ORAL_CAPSULE | ORAL | Status: DC

## 2013-12-12 MED ORDER — DULOXETINE HCL 60 MG PO CPEP: 60.0000 mg | ORAL_CAPSULE | ORAL | Status: DC

## 2013-12-12 MED ORDER — FUROSEMIDE 40 MG PO TABS: 40.0000 mg | ORAL_TABLET | Freq: Every day | ORAL | Status: DC

## 2013-12-12 MED ORDER — PIROXICAM 20 MG PO CAPS
20.0000 mg | ORAL_CAPSULE | Freq: Every day | ORAL | Status: DC
Start: 2013-12-12 — End: 2014-04-07

## 2013-12-12 NOTE — Patient Instructions (Signed)
1)  Leg Pain - Take Lasix 40 mg daily to reduce the swelling along with K-Dur to replace the potassium.  Once you get her legs down, go to [place] and get the compression stockings.  Take the Piroxicam for pain along with other meds.  We are making her an appointment with Dr. Nickola MajorHawkes with [facility].  Apply Tiger Balm to legs.     Edema Edema is an abnormal build-up of fluids in tissues. Because this is partly dependent on gravity (water flows to the lowest place), it is more common in the legs and thighs (lower extremities). It is also common in the looser tissues, like around the eyes. Painless swelling of the feet and ankles is common and increases as a person ages. It may affect both legs and may include the calves or even thighs. When squeezed, the fluid may move out of the affected area and may leave a dent for a few moments. CAUSES   Prolonged standing or sitting in one place for extended periods of time. Movement helps pump tissue fluid into the veins, and absence of movement prevents this, resulting in edema.  Varicose veins. The valves in the veins do not work as well as they should. This causes fluid to leak into the tissues.  Fluid and salt overload.  Injury, burn, or surgery to the leg, ankle, or foot, may damage veins and allow fluid to leak out.  Sunburn damages vessels. Leaky vessels allow fluid to go out into the sunburned tissues.  Allergies (from insect bites or stings, medications or chemicals) cause swelling by allowing vessels to become leaky.  Protein in the blood helps keep fluid in your vessels. Low protein, as in malnutrition, allows fluid to leak out.  Hormonal changes, including pregnancy and menstruation, cause fluid retention. This fluid may leak out of vessels and cause edema.  Medications that cause fluid retention. Examples are sex hormones, blood pressure medications, steroid treatment, or anti-depressants.  Some illnesses cause edema, especially heart  failure, kidney disease, or liver disease.  Surgery that cuts veins or lymph nodes, such as surgery done for the heart or for breast cancer, may result in edema. DIAGNOSIS  Your caregiver is usually easily able to determine what is causing your swelling (edema) by simply asking what is wrong (getting a history) and examining you (doing a physical). Sometimes x-rays, EKG (electrocardiogram or heart tracing), and blood work may be done to evaluate for underlying medical illness. TREATMENT  General treatment includes:  Leg elevation (or elevation of the affected body part).  Restriction of fluid intake.  Prevention of fluid overload.  Compression of the affected body part. Compression with elastic bandages or support stockings squeezes the tissues, preventing fluid from entering and forcing it back into the blood vessels.  Diuretics (also called water pills or fluid pills) pull fluid out of your body in the form of increased urination. These are effective in reducing the swelling, but can have side effects and must be used only under your caregiver's supervision. Diuretics are appropriate only for some types of edema. The specific treatment can be directed at any underlying causes discovered. Heart, liver, or kidney disease should be treated appropriately. HOME CARE INSTRUCTIONS   Elevate the legs (or affected body part) above the level of the heart, while lying down.  Avoid sitting or standing still for prolonged periods of time.  Avoid putting anything directly under the knees when lying down, and do not wear constricting clothing or garters on the  upper legs.  Exercising the legs causes the fluid to work back into the veins and lymphatic channels. This may help the swelling go down.  The pressure applied by elastic bandages or support stockings can help reduce ankle swelling.  A low-salt diet may help reduce fluid retention and decrease the ankle swelling.  Take any medications  exactly as prescribed. SEEK MEDICAL CARE IF:  Your edema is not responding to recommended treatments. SEEK IMMEDIATE MEDICAL CARE IF:   You develop shortness of breath or chest pain.  You cannot breathe when you lay down; or if, while lying down, you have to get up and go to the window to get your breath.  You are having increasing swelling without relief from treatment.  You develop a fever over 102 F (38.9 C).  You develop pain or redness in the areas that are swollen.  Tell your caregiver right away if you have gained 03 lb/1.4 kg in 1 day or 05 lb/2.3 kg in a week. MAKE SURE YOU:   Understand these instructions.  Will watch your condition.  Will get help right away if you are not doing well or get worse. Document Released: 11/07/2005 Document Revised: 05/08/2012 Document Reviewed: 06/25/2008 [facility] Patient Information 2014 [place], [place].

## 2013-12-12 NOTE — Progress Notes (Signed)
Subjective:    Patient ID: Kelli Pena, female    DOB: Aug 14, 1920, 80 y.o.   MRN: [id number]  HPI  Kelli Pena is here today with her daughter Kelli Ang([name]) complaining of lower leg pain and swelling (Left > Right).  She has been struggling with this problem for years but her daughter says that the pain and swelling has worsened significantly over the past three weeks.  She has been taking several mediations for pain and has been applying Lidoderm patches.  They wonder if she has a blood clot in her left leg.     Review of Systems  Cardiovascular: Positive for leg swelling.  Musculoskeletal:       Pain in both legs.      Past Medical History  Diagnosis Date  . Hypertension   . Diabetes mellitus without complication   . Arthritis   . Hypothyroid   . Fibromyalgia   . Complication of anesthesia     trouble waking up, very cold, hypotension     Past Surgical History  Procedure Laterality Date  . Hip surgery    . Cholecystectomy    . Eye surgery       History   Social History Narrative   Marital Status: Widowed    Children: Daughter Oceanographer([name])   Pets:  Dog (1)    Living Situation: Lives alone.   Occupation: Retired Training and development officer(Secretary)    Education: Engineer, agriculturalHigh School Graduate    Tobacco Use/Exposure:  She smoked 1 - 1 1/2 ppd for 20 years. She quit in 1967.    Alcohol Use:  Rarely   Drug Use:  None   Diet:  Regular   Exercise:  None   Hobbies: Playing with Dog/ Taking Picture/ Reading              Family History  Problem Relation Age of Onset  . Stroke Father   . Arthritis Sister   . Arthritis Daughter      Current Outpatient Prescriptions on File Prior to Visit  Medication Sig Dispense Refill  . aspirin 325 MG tablet Take 325 mg by mouth daily with breakfast.      . clotrimazole-betamethasone (LOTRISONE) cream Apply to affected area 2 times daily  45 g  1  . docusate sodium 100 MG CAPS Take 100 mg by mouth 2 (two) times daily.  10 capsule    . gabapentin (NEURONTIN) 600 MG  tablet Take 1 tablet (600 mg total) by mouth at bedtime.  30 tablet  5  . HYDROcodone-acetaminophen (NORCO/VICODIN) 5-325 MG per tablet Take one tablet by mouth every six hours routinely.  120 tablet  0  . levothyroxine (SYNTHROID, LEVOTHROID) 75 MCG tablet Take 1 tablet (75 mcg total) by mouth daily.  30 tablet  11  . lidocaine (LIDODERM) 5 % Place 3 patches onto the skin daily. Remove & Discard patch within 12 hours or as directed by MD  270 patch  3  . losartan (COZAAR) 100 MG tablet Take 1 tablet (100 mg total) by mouth daily.  30 tablet  11  . methocarbamol (ROBAXIN) 500 MG tablet Take 1/2 - 1 tab po BID as needed for muscle pain  60 tablet  5  . metoprolol succinate (TOPROL XL) 50 MG 24 hr tablet Take 1 tab po Q AM  30 tablet  11  . polyethylene glycol (MIRALAX / GLYCOLAX) packet Take 17 g by mouth daily as needed.  14 each    . rOPINIRole (REQUIP) 4 MG tablet Take  1/2 tablet early afternoon and 1 at bedtime for restless leg  30 tablet  11  . traMADol (ULTRAM) 50 MG tablet Take 1 tablet (50 mg total) by mouth every 6 (six) hours as needed for pain.  120 tablet  5   No current facility-administered medications on file prior to visit.     Allergies  Allergen Reactions  . Tetanus Toxoids      Immunization History  Administered Date(s) Administered  . Influenza,inj,Quad PF,36+ Mos 07/17/2013      Objective:   Physical Exam  Nursing note and vitals reviewed. Constitutional: She is oriented to person, place, and time.  Eyes: Conjunctivae are normal. No scleral icterus.  Neck: Neck supple. No thyromegaly present.  Cardiovascular: Normal rate.   Pulmonary/Chest: Effort normal and breath sounds normal.  Musculoskeletal: She exhibits edema (Left Leg  (2+ pitting) ) and tenderness (Pain is present; she can not stand.  ).  Neurological: She is alert and oriented to person, place, and time.  Skin: There is erythema (Lower left leg).  Psychiatric: She has a normal mood and affect. Her  behavior is normal. Judgment and thought content normal.       Assessment & Plan:    Camrynn was seen today for leg pain.  Diagnoses and associated orders for this visit:  Swelling of limb - US Venous Img Lower Unilateral Left - furosemide (LASIX) 40 MG tablet; Take 1 tablet (40 mg total) by mouth daily. - potassium chloride (K-DUR,KLOR-CON) 10 MEQ tablet; Take 1 tablet (10 mEq total) by mouth 2 (two) times daily.  Pain in limb - DG Ankle Complete Left - DG Tibia/Fibula Left  Chronic pain syndrome - piroxicam (FELDENE) 20 MG capsule; Take 1 capsule (20 mg total) by mouth daily. - DULoxetine (CYMBALTA) 60 MG capsule; Take 1 capsule (60 mg total) by mouth every morning. - Ambulatory referral to Rheumatology  Unspecified vitamin D deficiency - Vitamin D, Ergocalciferol, (DRISDOL) 50000 UNITS CAPS capsule; Take 1 capsule (50,000 Units total) by mouth every 7 (seven) days.   TIME SPENT "FACE TO FACE" WITH PATIENT -  30 MINS

## 2013-12-31 ENCOUNTER — Emergency Department (HOSPITAL_COMMUNITY): Payer: Medicare Other

## 2013-12-31 ENCOUNTER — Emergency Department (HOSPITAL_COMMUNITY)
Admission: EM | Admit: 2013-12-31 | Discharge: 2013-12-31 | Disposition: A | Discharge status: Home / Self Care | Payer: Medicare Other | Attending: Emergency Medicine | Admitting: Emergency Medicine

## 2013-12-31 ENCOUNTER — Encounter (HOSPITAL_COMMUNITY): Payer: Self-pay | Admitting: Emergency Medicine

## 2013-12-31 DIAGNOSIS — E119 Type 2 diabetes mellitus without complications: Secondary | ICD-10-CM | POA: Insufficient documentation

## 2013-12-31 DIAGNOSIS — S99929A Unspecified injury of unspecified foot, initial encounter: Principal | ICD-10-CM

## 2013-12-31 DIAGNOSIS — M25571 Pain in right ankle and joints of right foot: Secondary | ICD-10-CM

## 2013-12-31 DIAGNOSIS — S99919A Unspecified injury of unspecified ankle, initial encounter: Principal | ICD-10-CM

## 2013-12-31 DIAGNOSIS — Y9301 Activity, walking, marching and hiking: Secondary | ICD-10-CM | POA: Insufficient documentation

## 2013-12-31 DIAGNOSIS — Y929 Unspecified place or not applicable: Secondary | ICD-10-CM | POA: Insufficient documentation

## 2013-12-31 DIAGNOSIS — X500XXA Overexertion from strenuous movement or load, initial encounter: Secondary | ICD-10-CM | POA: Insufficient documentation

## 2013-12-31 DIAGNOSIS — E039 Hypothyroidism, unspecified: Secondary | ICD-10-CM | POA: Insufficient documentation

## 2013-12-31 DIAGNOSIS — Z79899 Other long term (current) drug therapy: Secondary | ICD-10-CM | POA: Insufficient documentation

## 2013-12-31 DIAGNOSIS — Z7982 Long term (current) use of aspirin: Secondary | ICD-10-CM | POA: Insufficient documentation

## 2013-12-31 DIAGNOSIS — I10 Essential (primary) hypertension: Secondary | ICD-10-CM | POA: Insufficient documentation

## 2013-12-31 DIAGNOSIS — Z87891 Personal history of nicotine dependence: Secondary | ICD-10-CM | POA: Insufficient documentation

## 2013-12-31 DIAGNOSIS — IMO0001 Reserved for inherently not codable concepts without codable children: Secondary | ICD-10-CM | POA: Insufficient documentation

## 2013-12-31 DIAGNOSIS — M129 Arthropathy, unspecified: Secondary | ICD-10-CM | POA: Insufficient documentation

## 2013-12-31 DIAGNOSIS — S8990XA Unspecified injury of unspecified lower leg, initial encounter: Secondary | ICD-10-CM | POA: Insufficient documentation

## 2013-12-31 MED ORDER — OXYCODONE-ACETAMINOPHEN 5-325 MG PO TABS: 1.0000 | ORAL_TABLET | Freq: Four times a day (QID) | ORAL | Status: DC | PRN

## 2013-12-31 MED ORDER — OXYCODONE HCL 5 MG PO TABS
5.0000 mg | ORAL_TABLET | Freq: Once | ORAL | Status: AC
  Administered 2013-12-31: 5 mg via ORAL
  Filled 2013-12-31: qty 1

## 2013-12-31 NOTE — ED Provider Notes (Signed)
Medical screening examination/treatment/procedure(s) were performed by non-physician practitioner and as supervising physician I was immediately available for consultation/collaboration.  EKG Interpretation   None        Ketina Mars K Linker, MD 12/31/13 2257 

## 2013-12-31 NOTE — ED Notes (Signed)
Pt fell tonight at 7 pm  And injured her right foot,  Also for 3 weeks her left leg and foot had been swollen,  She has had a total workup on left leg but daughter would  Also like it xrayed

## 2013-12-31 NOTE — ED Provider Notes (Signed)
CSN: [id number]     Arrival date & time 12/31/13  2025 History   First MD Initiated Contact with Patient 12/31/13 2053     Chief Complaint  Patient presents with  . Foot Injury     (Consider location/radiation/quality/duration/timing/severity/associated sxs/prior Treatment) HPI Patient presents with right ankle and foot pain after twisting ankle while walking out with her walker onto the porch to get her mail.  There is a small step down on the porch that is difficult for the patient.  Denies hitting head or syncope, denies pain anywhere else, denies back or neck pain.  Pt has been undergoing treatment for left leg pain and swelling - has had "full workup" including d-dimer, doppler US, xrays, has been treated with lasix with great improvement.  Daughter requests xray to check on this leg too, there is no new injury but patient with continued pain in this area.  No weakness or numbness.    Past Medical History  Diagnosis Date  . Hypertension   . Diabetes mellitus without complication   . Arthritis   . Hypothyroid   . Fibromyalgia   . Complication of anesthesia     trouble waking up, very cold, hypotension   Past Surgical History  Procedure Laterality Date  . Hip surgery    . Cholecystectomy    . Eye surgery     Family History  Problem Relation Age of Onset  . Stroke Father   . Arthritis Sister   . Arthritis Daughter    History  Substance Use Topics  . Smoking status: Former Smoker -- 1.50 packs/day for 20 years    Types: Cigarettes    Quit date: 11/21/1965  . Smokeless tobacco: Never Used  . Alcohol Use: Yes     Comment: Rarely    OB History   Grav Para Term Preterm Abortions TAB SAB Ect Mult Living                 Review of Systems  Cardiovascular: Negative for chest pain.  Gastrointestinal: Negative for abdominal pain.  Musculoskeletal: Negative for back pain and neck pain.  Skin: Positive for color change (improved redness on left leg ). Negative for wound.    Neurological: Negative for syncope, weakness and numbness.  Psychiatric/Behavioral: Negative for confusion.      Allergies  Tetanus toxoids  Home Medications   Current Outpatient Rx  Name  Route  Sig  Dispense  Refill  . aspirin 325 MG tablet   Oral   Take 325 mg by mouth daily with breakfast.         . clotrimazole-betamethasone (LOTRISONE) cream      Apply to affected area 2 times daily   45 g   1   . docusate sodium 100 MG CAPS   Oral   Take 100 mg by mouth 2 (two) times daily.   10 capsule      . DULoxetine (CYMBALTA) 60 MG capsule   Oral   Take 60 mg by mouth at bedtime.         . furosemide (LASIX) 40 MG tablet   Oral   Take 1 tablet (40 mg total) by mouth daily.   30 tablet   11   . gabapentin (NEURONTIN) 300 MG capsule   Oral   Take 300-600 mg by mouth at bedtime.         Marland Kitchen. HYDROcodone-acetaminophen (NORCO/VICODIN) 5-325 MG per tablet   Oral   Take 1 tablet by mouth every 4 (  four) hours as needed for moderate pain.         Marland Kitchen levothyroxine (SYNTHROID, LEVOTHROID) 75 MCG tablet   Oral   Take 1 tablet (75 mcg total) by mouth daily.   30 tablet   11   . lidocaine (LIDODERM) 5 %   Transdermal   Place 3 patches onto the skin daily. Apply to both knees and right shoulder. Remove & Discard patch within 12 hours or as directed by MD.         . losartan (COZAAR) 25 MG tablet   Oral   Take 12.5 mg by mouth daily.         . methocarbamol (ROBAXIN) 500 MG tablet      every 6 (six) hours as needed. Take 1/2 - 1 tab (250-500mg ) as needed for muscle pain.         . metoprolol succinate (TOPROL-XL) 50 MG 24 hr tablet   Oral   Take 50 mg by mouth every morning.          . piroxicam (FELDENE) 20 MG capsule   Oral   Take 1 capsule (20 mg total) by mouth daily.   30 capsule   5   . polyethylene glycol (MIRALAX / GLYCOLAX) packet   Oral   Take 4-17 g by mouth daily as needed (constipation).         . potassium chloride  (K-DUR,KLOR-CON) 10 MEQ tablet   Oral   Take 1 tablet (10 mEq total) by mouth 2 (two) times daily.   60 tablet   11   . rOPINIRole (REQUIP) 4 MG tablet   Oral   Take 2-4 mg by mouth 2 (two) times daily. Take 1/2 (2mg ) tablet early afternoon and 1 tablet (4mg ) at bedtime for restless leg         . traMADol (ULTRAM) 50 MG tablet   Oral   Take 1 tablet (50 mg total) by mouth every 6 (six) hours as needed for pain.   120 tablet   5   . Vitamin D, Ergocalciferol, (DRISDOL) 50000 UNITS CAPS capsule   Oral   Take 50,000 Units by mouth every 7 (seven) days. Saturday.          BP 145/82  Pulse 79  Temp(Src) 97.9 F (36.6 C) (Oral)  Resp 18  SpO2 96% Physical Exam  Nursing note and vitals reviewed. Constitutional: She appears well-developed and well-nourished. No distress.  HENT:  Head: Normocephalic and atraumatic.  Neck: Neck supple.  Cardiovascular: Intact distal pulses.   Pulmonary/Chest: Effort normal.  Musculoskeletal:       Legs:      Feet:  Right foot sensation intact, pulses intact.   Neurological: She is alert.  Skin: She is not diaphoretic.    ED Course  Procedures (including critical care time) Labs Review Labs Reviewed - No data to display Imaging Review Dg Tibia/fibula Left  12/31/2013   CLINICAL DATA:  Edema in the left tibia and fibula. No known injury.  EXAM: LEFT TIBIA AND FIBULA - 2 VIEW  COMPARISON:  None.  FINDINGS: There is no evidence of fracture or dislocation. Degenerative joint changes of left knee with narrowed joint space and osteophyte formation are noted.  IMPRESSION: No acute fracture or dislocation. Severe osteoarthritic change of the knee.   Electronically Signed   By: Sherian Rein M.D.   On: 12/31/2013 22:10   Dg Ankle Complete Right  12/31/2013   CLINICAL DATA:  Ankle pain  EXAM: RIGHT  ANKLE - COMPLETE 3+ VIEW  COMPARISON:  None.  FINDINGS: There is no evidence of fracture, dislocation, or joint effusion. There is mild soft tissue  swelling laterally.  IMPRESSION: No acute fracture or dislocation. Mild soft tissue swelling lateral ankle.   Electronically Signed   By: Sherian Rein M.D.   On: 12/31/2013 22:11   Dg Foot Complete Right  12/31/2013   CLINICAL DATA:  Right foot pain  EXAM: RIGHT FOOT COMPLETE - 3+ VIEW  COMPARISON:  None.  FINDINGS: There is no evidence of fracture or dislocation. Soft tissues are unremarkable.  IMPRESSION: No acute fracture or dislocation.   Electronically Signed   By: Sherian Rein M.D.   On: 12/31/2013 22:13    EKG Interpretation   None      9:38 PM Dr Karma Ganja made aware of the patient.   MDM   Final diagnoses:  Right ankle pain    Pt with incomplete mechanical fall today injury her right ankle.  She did not fall to the ground.  Denies other injuries. Pt walks with a walker and does think she will be able manage at home with the help of her daughter.  Does not want to be admitted.  Placed in ASO.  Pt to take prescribed percocet instead of vicodin - discussed at length with patient and daughter.  Discussed result, findings, treatment, and follow up  with patient.  Pt given return precautions.  Pt verbalizes understanding and agrees with plan.        Trixie Dredge, PA-C 12/31/13 2256

## 2013-12-31 NOTE — Discharge Instructions (Signed)
Read the information below.  Use the prescribed medication as directed.  Please discuss all new medications with your pharmacist.  Do not take additional tylenol while taking the prescribed pain medication to avoid overdose.  You may return to the Emergency Department at any time for worsening condition or any new symptoms that concern you.  If you develop uncontrolled pain, weakness or numbness of the extremity, severe discoloration of the skin, or you are unable to move your ankle or walk, return to the ER for a recheck.    ° ° °Ankle Pain °Ankle pain is a common symptom. The bones, cartilage, tendons, and muscles of the ankle joint perform a lot of work each day. The ankle joint holds your body weight and allows you to move around. Ankle pain can occur on either side or back of 1 or both ankles. Ankle pain may be sharp and burning or dull and aching. There may be tenderness, stiffness, redness, or warmth around the ankle. The pain occurs more often when a person walks or puts pressure on the ankle. °CAUSES  °There are many reasons ankle pain can develop. It is important to work with your caregiver to identify the cause since many conditions can impact the bones, cartilage, muscles, and tendons. Causes for ankle pain include: °· Injury, including a break (fracture), sprain, or strain often due to a fall, sports, or a high-impact activity. °· Swelling (inflammation) of a tendon (tendonitis). °· Achilles tendon rupture. °· Ankle instability after repeated sprains and strains. °· Poor foot alignment. °· Pressure on a nerve (tarsal tunnel syndrome). °· Arthritis in the ankle or the lining of the ankle. °· Crystal formation in the ankle (gout or pseudogout). °DIAGNOSIS  °A diagnosis is based on your medical history, your symptoms, results of your physical exam, and results of diagnostic tests. Diagnostic tests may include X-ray exams or a computerized magnetic scan (magnetic resonance imaging, MRI). °TREATMENT    °Treatment will depend on the cause of your ankle pain and may include: °· Keeping pressure off the ankle and limiting activities. °· Using crutches or other walking support (a cane or brace). °· Using rest, ice, compression, and elevation. °· Participating in physical therapy or home exercises. °· Wearing shoe inserts or special shoes. °· Losing weight. °· Taking medications to reduce pain or swelling or receiving an injection. °· Undergoing surgery. °HOME CARE INSTRUCTIONS  °· Only take over-the-counter or prescription medicines for pain, discomfort, or fever as directed by your caregiver. °· Put ice on the injured area. °· Put ice in a plastic bag. °· Place a towel between your skin and the bag. °· Leave the ice on for 15-20 minutes at a time, 03-04 times a day. °· Keep your leg raised (elevated) when possible to lessen swelling. °· Avoid activities that cause ankle pain. °· Follow specific exercises as directed by your caregiver. °· Record how often you have ankle pain, the location of the pain, and what it feels like. This information may be helpful to you and your caregiver. °· Ask your caregiver about returning to work or sports and whether you should drive. °· Follow up with your caregiver for further examination, therapy, or testing as directed. °SEEK MEDICAL CARE IF:  °· Pain or swelling continues or worsens beyond 1 week. °· You have an oral temperature above 102° F (38.9° C). °· You are feeling unwell or have chills. °· You are having an increasingly difficult time with walking. °· You have loss of sensation   or other new symptoms. °· You have questions or concerns. °MAKE SURE YOU:  °· Understand these instructions. °· Will watch your condition. °· Will get help right away if you are not doing well or get worse. °Document Released: 04/27/2010 Document Revised: 01/30/2012 Document Reviewed: 04/27/2010 °[employer]® Patient Information ©2014 [employer]. ° °

## 2014-02-28 ENCOUNTER — Encounter: Payer: Self-pay | Admitting: *Deleted

## 2014-04-07 ENCOUNTER — Ambulatory Visit (INDEPENDENT_AMBULATORY_CARE_PROVIDER_SITE_OTHER): Payer: Medicare Other | Admitting: Family Medicine

## 2014-04-07 ENCOUNTER — Encounter: Payer: Self-pay | Admitting: Family Medicine

## 2014-04-07 VITALS — BP 115/55 | HR 72 | Resp 16

## 2014-04-07 DIAGNOSIS — G894 Chronic pain syndrome: Secondary | ICD-10-CM

## 2014-04-07 DIAGNOSIS — E559 Vitamin D deficiency, unspecified: Secondary | ICD-10-CM

## 2014-04-07 DIAGNOSIS — G2581 Restless legs syndrome: Secondary | ICD-10-CM

## 2014-04-07 DIAGNOSIS — R32 Unspecified urinary incontinence: Secondary | ICD-10-CM

## 2014-04-07 DIAGNOSIS — E038 Other specified hypothyroidism: Secondary | ICD-10-CM

## 2014-04-07 DIAGNOSIS — Z5181 Encounter for therapeutic drug level monitoring: Secondary | ICD-10-CM

## 2014-04-07 DIAGNOSIS — I10 Essential (primary) hypertension: Secondary | ICD-10-CM

## 2014-04-07 DIAGNOSIS — E876 Hypokalemia: Secondary | ICD-10-CM

## 2014-04-07 DIAGNOSIS — E039 Hypothyroidism, unspecified: Secondary | ICD-10-CM

## 2014-04-07 LAB — CBC WITH DIFFERENTIAL/PLATELET
Basophils Absolute: 0.1 10*3/uL (ref 0.0–0.1)
Basophils Relative: 1 % (ref 0–1)
Eosinophils Absolute: 0.3 10*3/uL (ref 0.0–0.7)
Eosinophils Relative: 3 % (ref 0–5)
HCT: 36.9 % (ref 36.0–46.0)
Hemoglobin: 12.2 g/dL (ref 12.0–15.0)
Lymphocytes Relative: 22 % (ref 12–46)
Lymphs Abs: 2 10*3/uL (ref 0.7–4.0)
MCH: 29.3 pg (ref 26.0–34.0)
MCHC: 33.1 g/dL (ref 30.0–36.0)
MCV: 88.5 fL (ref 78.0–100.0)
Monocytes Absolute: 0.7 10*3/uL (ref 0.1–1.0)
Monocytes Relative: 7 % (ref 3–12)
Neutro Abs: 6.2 10*3/uL (ref 1.7–7.7)
Neutrophils Relative %: 67 % (ref 43–77)
Platelets: 433 10*3/uL — ABNORMAL HIGH (ref 150–400)
RBC: 4.17 MIL/uL (ref 3.87–5.11)
RDW: 14.6 % (ref 11.5–15.5)
WBC: 9.3 10*3/uL (ref 4.0–10.5)

## 2014-04-07 LAB — COMPLETE METABOLIC PANEL WITH GFR
ALT: 12 U/L (ref 0–35)
AST: 13 U/L (ref 0–37)
Albumin: 4 g/dL (ref 3.5–5.2)
Alkaline Phosphatase: 94 U/L (ref 39–117)
BUN: 28 mg/dL — ABNORMAL HIGH (ref 6–23)
CO2: 31 mEq/L (ref 19–32)
Calcium: 9.7 mg/dL (ref 8.4–10.5)
Chloride: 102 mEq/L (ref 96–112)
Creat: 1.4 mg/dL — ABNORMAL HIGH (ref 0.50–1.10)
GFR, Est African American: 38 mL/min — ABNORMAL LOW
GFR, Est Non African American: 33 mL/min — ABNORMAL LOW
Glucose, Bld: 123 mg/dL — ABNORMAL HIGH (ref 70–99)
Potassium: 5.1 mEq/L (ref 3.5–5.3)
Sodium: 139 mEq/L (ref 135–145)
Total Bilirubin: 0.3 mg/dL (ref 0.2–1.2)
Total Protein: 6.4 g/dL (ref 6.0–8.3)

## 2014-04-07 LAB — POCT URINALYSIS DIPSTICK
Bilirubin, UA: NEGATIVE
Blood, UA: NEGATIVE
Glucose, UA: NEGATIVE
Leukocytes, UA: NEGATIVE
Nitrite, UA: NEGATIVE
Protein, UA: NEGATIVE
Spec Grav, UA: 1.02
Urobilinogen, UA: NEGATIVE
pH, UA: 6.5

## 2014-04-07 MED ORDER — DICLOFENAC SODIUM 1 % TD GEL: 4.0000 g | Freq: Four times a day (QID) | TRANSDERMAL | Status: DC

## 2014-04-07 MED ORDER — PIROXICAM 20 MG PO CAPS: 20.0000 mg | ORAL_CAPSULE | Freq: Every day | ORAL | Status: DC

## 2014-04-07 MED ORDER — GABAPENTIN 600 MG PO TABS: 600.0000 mg | ORAL_TABLET | Freq: Every day | ORAL | Status: DC

## 2014-04-07 MED ORDER — ROPINIROLE HCL 4 MG PO TABS: 4.0000 mg | ORAL_TABLET | Freq: Every day | ORAL | Status: DC

## 2014-04-07 MED ORDER — LEVOTHYROXINE SODIUM 75 MCG PO TABS: 75.0000 ug | ORAL_TABLET | Freq: Every day | ORAL | Status: DC

## 2014-04-07 MED ORDER — LIDOCAINE 5 % EX PTCH: 3.0000 | MEDICATED_PATCH | CUTANEOUS | Status: DC

## 2014-04-07 MED ORDER — TRAMADOL HCL 50 MG PO TABS: 50.0000 mg | ORAL_TABLET | Freq: Four times a day (QID) | ORAL | Status: DC | PRN

## 2014-04-07 MED ORDER — POTASSIUM BICARB-CITRIC ACID 20 MEQ PO TBEF: 1.0000 | EFFERVESCENT_TABLET | Freq: Every day | ORAL | Status: DC

## 2014-04-07 MED ORDER — IRBESARTAN 150 MG PO TABS: 150.0000 mg | ORAL_TABLET | Freq: Every day | ORAL | Status: DC

## 2014-04-07 NOTE — Progress Notes (Signed)
Subjective:    Patient ID: Kelli Pena, female    DOB: 02-02-1920, 80 y.o.   MRN: [id number]  HPI  Kelli Pena is here today with her daughter Kelli Pena(Kelli Pena) to follow up on the following issues:  1)  Hypertension:  She is currently only taking losartan for her BP.  She was taking Toprol but stopped it because her BP was too low.    2)  Hypothyroidism:  She is doing well on the levothyroxine 75 mcg.    3)  Chronic Pain:  She needs a refill on the Tramadol, Gabapentin and the Lidoderm patch. She would also like to get another prescription for the Voltaren Gel  4)  Leg Pain:  She has been having problems with her legs "giving out" on her and she has fallen three times due to this.   5)  Myalgia:  She is doing well on the Cymbalta.   Review of Systems  Constitutional: Positive for fatigue. Negative for activity change and appetite change.  Cardiovascular: Negative for chest pain, palpitations and leg swelling.  Endocrine: Negative for cold intolerance and heat intolerance.  Musculoskeletal:       Leg weakness when she walks  Psychiatric/Behavioral: The patient is not nervous/anxious.   All other systems reviewed and are negative.    Past Medical History  Diagnosis Date  . Hypertension   . Diabetes mellitus without complication   . Arthritis   . Hypothyroid   . Fibromyalgia   . Complication of anesthesia     trouble waking up, very cold, hypotension     Past Surgical History  Procedure Laterality Date  . Hip surgery    . Cholecystectomy    . Eye surgery       History   Social History Narrative   Marital Status: Widowed    Children: Daughter Oceanographer(Kelli Pena)   Pets:  Dog (1)    Living Situation: Lives alone.   Occupation: Retired Training and development officer(Secretary)    Education: Engineer, agriculturalHigh School Graduate    Tobacco Use/Exposure:  She smoked 1 - 1 1/2 ppd for 20 years. She quit in 1967.    Alcohol Use:  Rarely   Drug Use:  None   Diet:  Regular   Exercise:  None   Hobbies: Playing with Dog/ Taking  Picture/ Reading              Family History  Problem Relation Age of Onset  . Stroke Father   . Arthritis Sister   . Arthritis Daughter      Current Outpatient Prescriptions on File Prior to Visit  Medication Sig Dispense Refill  . aspirin 325 MG tablet Take 325 mg by mouth daily with breakfast.      . DULoxetine (CYMBALTA) 60 MG capsule Take 60 mg by mouth at bedtime.      . furosemide (LASIX) 40 MG tablet Take 1 tablet (40 mg total) by mouth daily.  30 tablet  11  . HYDROcodone-acetaminophen (NORCO/VICODIN) 5-325 MG per tablet Take 1 tablet by mouth every 4 (four) hours as needed for moderate pain.      Marland Kitchen. losartan (COZAAR) 25 MG tablet Take 12.5 mg by mouth daily.      . polyethylene glycol (MIRALAX / GLYCOLAX) packet Take 4-17 g by mouth daily as needed (constipation).      . potassium chloride (K-DUR,KLOR-CON) 10 MEQ tablet Take 1 tablet (10 mEq total) by mouth 2 (two) times daily.  60 tablet  11  . Vitamin D, Ergocalciferol, (DRISDOL)  50000 UNITS CAPS capsule Take 50,000 Units by mouth every 7 (seven) days. Saturday.       No current facility-administered medications on file prior to visit.     Allergies  Allergen Reactions  . Tetanus Toxoids Swelling     Immunization History  Administered Date(s) Administered  . Influenza,inj,Quad PF,36+ Mos 07/17/2013       Objective:   Physical Exam  Nursing note and vitals reviewed. Constitutional: She is oriented to person, place, and time. She appears well-developed and well-nourished.  She is sitting in a wheelchair   HENT:  She has trouble hearing.  Cardiovascular: Normal rate and regular rhythm.   Pulmonary/Chest: Effort normal and breath sounds normal.  Musculoskeletal: She exhibits no edema.  Neurological: She is alert and oriented to person, place, and time.  Skin: Skin is warm and dry.  Psychiatric: She has a normal mood and affect. Her behavior is normal. Judgment and thought content normal.      Assessment  & Plan:    Kelli Pena was seen today for medication management.  Diagnoses and associated orders for this visit:  Essential hypertension, benign - COMPLETE METABOLIC PANEL WITH GFR - irbesartan (AVAPRO) 150 MG tablet; Take 1 tablet (150 mg total) by mouth daily.  Other specified acquired hypothyroidism - TSH - T4, free  Chronic pain syndrome - gabapentin (NEURONTIN) 600 MG tablet; Take 1 tablet (600 mg total) by mouth at bedtime. - traMADol (ULTRAM) 50 MG tablet; Take 1 tablet (50 mg total) by mouth every 6 (six) hours as needed. - piroxicam (FELDENE) 20 MG capsule; Take 1 capsule (20 mg total) by mouth daily. - diclofenac sodium (VOLTAREN) 1 % GEL; Apply 4 g topically 4 (four) times daily. - lidocaine (LIDODERM) 5 %; Place 3 patches onto the skin daily. Apply to both knees and right shoulder. Remove & Discard patch within 12 hours or as directed by MD.  Restless leg - rOPINIRole (REQUIP) 4 MG tablet; Take 1 tablet (4 mg total) by mouth at bedtime.  Unspecified hypothyroidism - levothyroxine (SYNTHROID, LEVOTHROID) 75 MCG tablet; Take 1 tablet (75 mcg total) by mouth daily.  Encounter for therapeutic drug monitoring - CBC w/Diff  Unspecified vitamin D deficiency - Vit D  25 hydroxy (rtn osteoporosis monitoring)  Low blood potassium - Potassium Bicarb-Citric Acid (EFFER-K) 20 MEQ TBEF; Take 1 tablet (20 mEq total) by mouth daily.  Urinary incontinence, unspecified incontinence type Comments: We checked a U/A which was normal.   - POCT urinalysis dipstick   TIME SPENT "FACE TO FACE" WITH PATIENT - 45  MINS

## 2014-04-08 LAB — T4, FREE: Free T4: 0.9 ng/dL (ref 0.80–1.80)

## 2014-04-08 LAB — VITAMIN D 25 HYDROXY (VIT D DEFICIENCY, FRACTURES): Vit D, 25-Hydroxy: 50 ng/mL (ref 30–89)

## 2014-04-08 LAB — TSH: TSH: 1.992 u[IU]/mL (ref 0.350–4.500)

## 2014-06-30 DIAGNOSIS — R32 Unspecified urinary incontinence: Secondary | ICD-10-CM | POA: Insufficient documentation

## 2014-06-30 DIAGNOSIS — G2581 Restless legs syndrome: Secondary | ICD-10-CM | POA: Insufficient documentation

## 2014-06-30 DIAGNOSIS — E876 Hypokalemia: Secondary | ICD-10-CM | POA: Insufficient documentation

## 2014-06-30 DIAGNOSIS — E038 Other specified hypothyroidism: Secondary | ICD-10-CM | POA: Insufficient documentation

## 2014-07-16 ENCOUNTER — Emergency Department (HOSPITAL_COMMUNITY): Payer: Medicare Other

## 2014-07-16 ENCOUNTER — Inpatient Hospital Stay (HOSPITAL_COMMUNITY)
Admission: EM | Admit: 2014-07-16 | Discharge: 2014-07-28 | DRG: 871 | Disposition: A | Discharge status: Other Acute Inpatient Hospital | Payer: Medicare Other | Attending: Internal Medicine | Admitting: Internal Medicine

## 2014-07-16 ENCOUNTER — Encounter (HOSPITAL_COMMUNITY): Payer: Self-pay | Admitting: Emergency Medicine

## 2014-07-16 DIAGNOSIS — IMO0001 Reserved for inherently not codable concepts without codable children: Secondary | ICD-10-CM | POA: Diagnosis present

## 2014-07-16 DIAGNOSIS — R6510 Systemic inflammatory response syndrome (SIRS) of non-infectious origin without acute organ dysfunction: Secondary | ICD-10-CM

## 2014-07-16 DIAGNOSIS — E039 Hypothyroidism, unspecified: Secondary | ICD-10-CM | POA: Diagnosis present

## 2014-07-16 DIAGNOSIS — W06XXXA Fall from bed, initial encounter: Secondary | ICD-10-CM | POA: Diagnosis present

## 2014-07-16 DIAGNOSIS — N179 Acute kidney failure, unspecified: Secondary | ICD-10-CM | POA: Diagnosis present

## 2014-07-16 DIAGNOSIS — G894 Chronic pain syndrome: Secondary | ICD-10-CM

## 2014-07-16 DIAGNOSIS — N289 Disorder of kidney and ureter, unspecified: Secondary | ICD-10-CM

## 2014-07-16 DIAGNOSIS — G629 Polyneuropathy, unspecified: Secondary | ICD-10-CM

## 2014-07-16 DIAGNOSIS — F329 Major depressive disorder, single episode, unspecified: Secondary | ICD-10-CM

## 2014-07-16 DIAGNOSIS — I10 Essential (primary) hypertension: Secondary | ICD-10-CM | POA: Diagnosis present

## 2014-07-16 DIAGNOSIS — A419 Sepsis, unspecified organism: Secondary | ICD-10-CM | POA: Diagnosis not present

## 2014-07-16 DIAGNOSIS — S82209A Unspecified fracture of shaft of unspecified tibia, initial encounter for closed fracture: Secondary | ICD-10-CM | POA: Diagnosis present

## 2014-07-16 DIAGNOSIS — J9819 Other pulmonary collapse: Secondary | ICD-10-CM | POA: Diagnosis present

## 2014-07-16 DIAGNOSIS — Z7982 Long term (current) use of aspirin: Secondary | ICD-10-CM

## 2014-07-16 DIAGNOSIS — G2581 Restless legs syndrome: Secondary | ICD-10-CM

## 2014-07-16 DIAGNOSIS — R4182 Altered mental status, unspecified: Secondary | ICD-10-CM

## 2014-07-16 DIAGNOSIS — T40605A Adverse effect of unspecified narcotics, initial encounter: Secondary | ICD-10-CM | POA: Diagnosis not present

## 2014-07-16 DIAGNOSIS — W19XXXA Unspecified fall, initial encounter: Secondary | ICD-10-CM | POA: Diagnosis present

## 2014-07-16 DIAGNOSIS — M79609 Pain in unspecified limb: Secondary | ICD-10-CM | POA: Diagnosis not present

## 2014-07-16 DIAGNOSIS — F411 Generalized anxiety disorder: Secondary | ICD-10-CM

## 2014-07-16 DIAGNOSIS — K668 Other specified disorders of peritoneum: Secondary | ICD-10-CM

## 2014-07-16 DIAGNOSIS — I4891 Unspecified atrial fibrillation: Secondary | ICD-10-CM | POA: Diagnosis not present

## 2014-07-16 DIAGNOSIS — Z87891 Personal history of nicotine dependence: Secondary | ICD-10-CM

## 2014-07-16 DIAGNOSIS — K5900 Constipation, unspecified: Secondary | ICD-10-CM

## 2014-07-16 DIAGNOSIS — F3289 Other specified depressive episodes: Secondary | ICD-10-CM | POA: Diagnosis present

## 2014-07-16 DIAGNOSIS — E119 Type 2 diabetes mellitus without complications: Secondary | ICD-10-CM | POA: Diagnosis present

## 2014-07-16 DIAGNOSIS — M1990 Unspecified osteoarthritis, unspecified site: Secondary | ICD-10-CM | POA: Diagnosis present

## 2014-07-16 DIAGNOSIS — K5710 Diverticulosis of small intestine without perforation or abscess without bleeding: Secondary | ICD-10-CM | POA: Diagnosis present

## 2014-07-16 DIAGNOSIS — E038 Other specified hypothyroidism: Secondary | ICD-10-CM

## 2014-07-16 DIAGNOSIS — K265 Chronic or unspecified duodenal ulcer with perforation: Secondary | ICD-10-CM | POA: Diagnosis present

## 2014-07-16 DIAGNOSIS — I059 Rheumatic mitral valve disease, unspecified: Secondary | ICD-10-CM | POA: Diagnosis present

## 2014-07-16 DIAGNOSIS — K921 Melena: Secondary | ICD-10-CM

## 2014-07-16 DIAGNOSIS — S82109A Unspecified fracture of upper end of unspecified tibia, initial encounter for closed fracture: Secondary | ICD-10-CM | POA: Diagnosis present

## 2014-07-16 DIAGNOSIS — M1710 Unilateral primary osteoarthritis, unspecified knee: Secondary | ICD-10-CM | POA: Diagnosis present

## 2014-07-16 DIAGNOSIS — I480 Paroxysmal atrial fibrillation: Secondary | ICD-10-CM

## 2014-07-16 DIAGNOSIS — IMO0002 Reserved for concepts with insufficient information to code with codable children: Secondary | ICD-10-CM

## 2014-07-16 DIAGNOSIS — E876 Hypokalemia: Secondary | ICD-10-CM | POA: Diagnosis not present

## 2014-07-16 DIAGNOSIS — S82202A Unspecified fracture of shaft of left tibia, initial encounter for closed fracture: Secondary | ICD-10-CM

## 2014-07-16 DIAGNOSIS — I482 Chronic atrial fibrillation, unspecified: Secondary | ICD-10-CM

## 2014-07-16 DIAGNOSIS — K631 Perforation of intestine (nontraumatic): Secondary | ICD-10-CM | POA: Diagnosis not present

## 2014-07-16 DIAGNOSIS — D62 Acute posthemorrhagic anemia: Secondary | ICD-10-CM | POA: Diagnosis not present

## 2014-07-16 DIAGNOSIS — F32A Depression, unspecified: Secondary | ICD-10-CM

## 2014-07-16 DIAGNOSIS — Z9181 History of falling: Secondary | ICD-10-CM

## 2014-07-16 HISTORY — DX: Unspecified atrial fibrillation: I48.91

## 2014-07-16 MED ORDER — FENTANYL CITRATE 0.05 MG/ML IJ SOLN
75.0000 ug | Freq: Once | INTRAMUSCULAR | Status: AC
  Administered 2014-07-16: 75 ug via INTRAVENOUS
  Filled 2014-07-16: qty 2

## 2014-07-16 NOTE — ED Notes (Signed)
Per EMS: pt from home for eval of left lower leg pain after a fall this evening at 1830. Pt was trying to stand to use bedside commode and fell, pt reports that she heard a "pop" in her leg and fell back onto the bed. Denies any head trauma, any LOC. Pt denies taking any blood thinners. Pt axox 4 en route, received 350 mcg of fentanyl en route, denies any pain at this moment. nad noted. Deformity noted to left lower leg per EMS, NSR on the monitor per EMS

## 2014-07-16 NOTE — ED Notes (Signed)
Pt saturation decreased to 91%, pt placed on 2L increased to 99%

## 2014-07-16 NOTE — ED Provider Notes (Signed)
CSN: [id number]     Arrival date & time 07/16/14  2103 History   First MD Initiated Contact with Patient 07/16/14 2108     Chief Complaint  Patient presents with  . Fall  . Leg Pain     (Consider location/radiation/quality/duration/timing/severity/associated sxs/prior Treatment) HPI Kelli Pena is a 80 y.o. female with past medical history of hypertension, diabetes, arthritis, and stress fracture, presenting for fall and left lower extremity pain. Patient had a mechanical fall out of bed earlier this evening. Patient fell multiple pops in her left leg and fell back onto the bed. Did not hit her head, denies any loss of consciousness. Not currently on blood thinners. No symptoms prior to the fall such as chest pain, shortness of breath, dizziness, or lightheadedness. Patient received 350 mcg of fentanyl in route with EMS due to significant pain, with deformity noted to left lower extremity. Patient has a history of a stress fracture of the left proximal tibia. Patient has previously been evaluated by orthopedics and was told that she could bear weight as tolerated. Patient also has advanced arthritis of left knee, with history of prior left distal femur fracture. No other complaints at this time, pain is well-controlled with medications received prior to arrival.     Past Medical History  Diagnosis Date  . Hypertension   . Diabetes mellitus without complication   . Arthritis   . Hypothyroid   . Fibromyalgia   . Complication of anesthesia     trouble waking up, very cold, hypotension   Past Surgical History  Procedure Laterality Date  . Hip surgery    . Cholecystectomy    . Eye surgery     Family History  Problem Relation Age of Onset  . Stroke Father   . Arthritis Sister   . Arthritis Daughter    History  Substance Use Topics  . Smoking status: Former Smoker -- 1.50 packs/day for 20 years    Types: Cigarettes    Quit date: 11/21/1965  . Smokeless tobacco: Never Used  .  Alcohol Use: Yes     Comment: Rarely    OB History   Grav Para Term Preterm Abortions TAB SAB Ect Mult Living                 Review of Systems  Constitutional: Negative.   HENT: Negative.   Eyes: Negative.   Respiratory: Negative.   Cardiovascular: Negative.   Gastrointestinal: Negative.   Endocrine: Negative.   Genitourinary: Negative.   Musculoskeletal: Positive for gait problem and joint swelling.  Skin: Negative.   Allergic/Immunologic: Negative.   Neurological: Negative.   Hematological: Negative.   Psychiatric/Behavioral: Negative.       Allergies  Tetanus toxoids  Home Medications   Prior to Admission medications   Medication Sig Start Date End Date Taking? Authorizing Provider  aspirin 325 MG tablet Take 325 mg by mouth daily with breakfast.   Yes Historical Provider, MD  diclofenac sodium (VOLTAREN) 1 % GEL Apply 4 g topically 4 (four) times daily as needed (for pain).    Yes Historical Provider, MD  DULoxetine (CYMBALTA) 60 MG capsule Take 60 mg by mouth every morning.  12/12/13 12/12/14 Yes Gillian Scarce, MD  furosemide (LASIX) 40 MG tablet Take 40 mg by mouth daily.   Yes Historical Provider, MD  gabapentin (NEURONTIN) 600 MG tablet Take 600 mg by mouth at bedtime as needed (for pain).    Yes Historical Provider, MD  HYDROcodone-acetaminophen (NORCO/VICODIN) 5-325  MG per tablet Take 1 tablet by mouth every 6 (six) hours as needed for moderate pain.  02/13/13  Yes Mahima Pandey, MD  irbesartan (AVAPRO) 150 MG tablet Take 150 mg by mouth daily.   Yes Historical Provider, MD  levothyroxine (SYNTHROID, LEVOTHROID) 75 MCG tablet Take 75 mcg by mouth daily before breakfast.   Yes Historical Provider, MD  lidocaine (LIDODERM) 5 % Place 3 patches onto the skin daily as needed (for pain). Apply to both knees and right shoulder. Remove & Discard patch within 12 hours or as directed by MD   Yes Historical Provider, MD  Multiple Vitamin (MULTIVITAMIN) tablet Take 1 tablet by  mouth daily.   Yes Historical Provider, MD  piroxicam (FELDENE) 20 MG capsule Take 20 mg by mouth daily. With food   Yes Historical Provider, MD  polyethylene glycol (MIRALAX / GLYCOLAX) packet Take 4-17 g by mouth daily as needed (constipation). 12/28/12  Yes Joseph Art, DO  POTASSIUM PO Take 1 tablet by mouth daily.    Yes Historical Provider, MD  rOPINIRole (REQUIP) 4 MG tablet Take 4 mg by mouth at bedtime.   Yes Historical Provider, MD  sulfamethoxazole-trimethoprim (BACTRIM DS) 800-160 MG per tablet Take 1 tablet by mouth 2 (two) times daily. For 10 days. Started 07/11/14. Last dose should be 07/20/14. 07/11/14  Yes Historical Provider, MD  traMADol (ULTRAM) 50 MG tablet Take 50 mg by mouth every 6 (six) hours as needed for moderate pain.    Yes Historical Provider, MD  Vitamin D, Ergocalciferol, (DRISDOL) 50000 UNITS CAPS capsule Take 50,000 Units by mouth every 7 (seven) days. Saturday. 12/12/13 12/12/14  Gillian Scarce, MD   BP 138/56  Pulse 92  Temp(Src) 98.3 F (36.8 C) (Oral)  Resp 16  Ht  (1.499 m)  Wt 149 lb (67.586 kg)  BMI 30.08 kg/m2  SpO2 98% Physical Exam  Nursing note and vitals reviewed. Constitutional: She is oriented to person, place, and time. She appears well-developed and well-nourished. No distress.  HENT:  Head: Normocephalic and atraumatic.  Right Ear: External ear normal.  Left Ear: External ear normal.  Nose: Nose normal.  Mouth/Throat: Oropharynx is clear and moist. No oropharyngeal exudate.  Eyes: Conjunctivae and EOM are normal. Pupils are equal, round, and reactive to light. Right eye exhibits no discharge. Left eye exhibits no discharge. No scleral icterus.  Neck: Normal range of motion. Neck supple. No JVD present. No tracheal deviation present. No thyromegaly present.  Cardiovascular: Normal rate, regular rhythm, normal heart sounds and intact distal pulses.  Exam reveals no gallop and no friction rub.   No murmur heard. Pulmonary/Chest: Effort  normal and breath sounds normal. No stridor. No respiratory distress. She has no wheezes. She has no rales. She exhibits no tenderness.  Abdominal: Soft. Bowel sounds are normal. She exhibits no distension. There is no tenderness. There is no rebound and no guarding.  Musculoskeletal: She exhibits tenderness.       Left lower leg: She exhibits tenderness, bony tenderness, swelling and deformity. She exhibits no edema and no laceration.       Legs: Lymphadenopathy:    She has no cervical adenopathy.  Neurological: She is alert and oriented to person, place, and time. She is not disoriented. No cranial nerve deficit or sensory deficit. GCS eye subscore is 4. GCS verbal subscore is 5. GCS motor subscore is 6.  Skin: Skin is warm and dry. No rash noted. She is not diaphoretic. No erythema. No pallor.  Psychiatric: She has a normal mood and affect. Her behavior is normal. Judgment and thought content normal.    ED Course  Procedures (including critical care time) Labs Review Labs Reviewed  CBC WITH DIFFERENTIAL - Abnormal; Notable for the following:    RBC 3.83 (*)    Hemoglobin 11.5 (*)    HCT 34.5 (*)    Neutrophils Relative % 82 (*)    Lymphocytes Relative 11 (*)    All other components within normal limits  COMPREHENSIVE METABOLIC PANEL - Abnormal; Notable for the following:    Glucose, Bld 135 (*)    Creatinine, Ser 1.21 (*)    Albumin 3.2 (*)    GFR calc non Af Amer 38 (*)    GFR calc Af Amer 44 (*)    All other components within normal limits  URINALYSIS, ROUTINE W REFLEX MICROSCOPIC - Abnormal; Notable for the following:    Bilirubin Urine SMALL (*)    Ketones, ur 15 (*)    Leukocytes, UA TRACE (*)    All other components within normal limits  URINE MICROSCOPIC-ADD ON - Abnormal; Notable for the following:    Squamous Epithelial / LPF FEW (*)    Casts HYALINE CASTS (*)    All other components within normal limits  GLUCOSE, CAPILLARY - Abnormal; Notable for the following:     Glucose-Capillary 133 (*)    All other components within normal limits  GLUCOSE, CAPILLARY - Abnormal; Notable for the following:    Glucose-Capillary 115 (*)    All other components within normal limits  GLUCOSE, CAPILLARY - Abnormal; Notable for the following:    Glucose-Capillary 172 (*)    All other components within normal limits  CBC  BASIC METABOLIC PANEL    Imaging Review Dg Chest 2 View  07/16/2014   CLINICAL DATA:  Larey Seat this morning.  EXAM: CHEST  2 VIEW  COMPARISON:  07/22/2005.  FINDINGS: Normal sized heart. Clear lungs. Thoracic spine degenerative changes. 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 No fracture or pneumothorax.  IMPRESSION: No acute abnormality.   Electronically Signed   By: Gordan Payment M.D.   On: 07/16/2014 23:34   Dg Pelvis 1-2 Views  07/16/2014   CLINICAL DATA:  Fall  EXAM: PELVIS - 1-2 VIEW  COMPARISON:  Prior radiograph from 02/01/2011  FINDINGS: Right total hip arthroplasty in place. The acetabular and femoral components articulate normally with 1 another. No acute fracture or dislocation. Bony pelvis is intact. SI joints are approximated. No pubic dye stasis.  Prominent degenerative changes noted within the lower lumbar spine.  No soft tissue abnormality.  Osteopenia noted. Vascular calcifications present within the upper thighs.  IMPRESSION: 1. No acute fracture or dislocation. 2. Right hip arthroplasty in place without evidence of hardware complication.   Electronically Signed   By: Rise Mu M.D.   On: 07/16/2014 23:34   Dg Femur Left  07/16/2014   CLINICAL DATA:  Larey Seat at 6 a.m. this morning.  Left lower leg pain.  EXAM: LEFT FEMUR - 2 VIEW  COMPARISON:  None.  FINDINGS: There is no evidence of fracture or other focal bone lesions. Soft tissues are unremarkable. Prominent degenerative changes in the left knee. Vascular calcifications.  IMPRESSION: No acute bony abnormalities.   Electronically Signed   By:  Burman Nieves M.D.   On: 07/16/2014 23:31   Dg Tibia/fibula Left  07/16/2014   CLINICAL DATA:  Left lower leg pain after a fall at 6 a.m.  this morning.  EXAM: LEFT TIBIA AND FIBULA - 2 VIEW  COMPARISON:  12/31/2013  FINDINGS: Acute mostly transverse fracture of the proximal/ mid shaft of the left tibia without significant displacement. Marked degenerative changes in the left knee. Left fibula appears intact.  IMPRESSION: Fracture of the proximal/ mid shaft of the left tibia without significant displacement.   Electronically Signed   By: Burman Nieves M.D.   On: 07/16/2014 23:32   Dg Abd Portable 1v  07/17/2014   CLINICAL DATA:  Abdominal pain and vomiting.  EXAM: PORTABLE ABDOMEN - 1 VIEW  COMPARISON:  02/01/2011 and pelvis radiograph dated 07/16/2014.  FINDINGS: Normal bowel gas pattern. Stable 2.2 cm oval calcification in the region of the lower pole of the right kidney. Stable right hip prosthesis. Thoracolumbar spine degenerative changes and scoliosis.  IMPRESSION: No acute abnormality.   Electronically Signed   By: Gordan Payment M.D.   On: 07/17/2014 23:04     EKG Interpretation None      MDM   Final diagnoses:  Tibia fracture, left, closed, initial encounter    Patient has palpable deformity to left proximal tibia with significant pain with palpation of the affected area and distal extremity is neurovascular intact. Will obtain plain films of the pelvis, left femur, and left tibia-fibula. Will provide additional pain control as needed. Will obtain preoperative studies due to high suspicion for fracture.  Patient has comminuted fracture of proximal tibia near metaphyseal diaphyseal junction. Have discussed the patient with orthopedics on-call, Dr. Victorino Dike. Patient be admitted for further management, nonweightbearing to left lower extremity, will have posterior long-leg splint applied prior to transfer to floor.  Patient care was discussed with my attending, Dr.  Rubin Payor.     Gavin Pound, MD 07/18/14 [phone]

## 2014-07-17 ENCOUNTER — Inpatient Hospital Stay (HOSPITAL_COMMUNITY): Payer: Medicare Other

## 2014-07-17 DIAGNOSIS — A419 Sepsis, unspecified organism: Secondary | ICD-10-CM | POA: Diagnosis not present

## 2014-07-17 DIAGNOSIS — E039 Hypothyroidism, unspecified: Secondary | ICD-10-CM | POA: Diagnosis present

## 2014-07-17 DIAGNOSIS — I4891 Unspecified atrial fibrillation: Secondary | ICD-10-CM | POA: Diagnosis not present

## 2014-07-17 DIAGNOSIS — M1710 Unilateral primary osteoarthritis, unspecified knee: Secondary | ICD-10-CM | POA: Diagnosis present

## 2014-07-17 DIAGNOSIS — Z9181 History of falling: Secondary | ICD-10-CM | POA: Diagnosis not present

## 2014-07-17 DIAGNOSIS — K5710 Diverticulosis of small intestine without perforation or abscess without bleeding: Secondary | ICD-10-CM | POA: Diagnosis present

## 2014-07-17 DIAGNOSIS — T40605A Adverse effect of unspecified narcotics, initial encounter: Secondary | ICD-10-CM | POA: Diagnosis not present

## 2014-07-17 DIAGNOSIS — D62 Acute posthemorrhagic anemia: Secondary | ICD-10-CM | POA: Diagnosis not present

## 2014-07-17 DIAGNOSIS — N179 Acute kidney failure, unspecified: Secondary | ICD-10-CM | POA: Diagnosis present

## 2014-07-17 DIAGNOSIS — E119 Type 2 diabetes mellitus without complications: Secondary | ICD-10-CM | POA: Diagnosis present

## 2014-07-17 DIAGNOSIS — F3289 Other specified depressive episodes: Secondary | ICD-10-CM | POA: Diagnosis present

## 2014-07-17 DIAGNOSIS — IMO0001 Reserved for inherently not codable concepts without codable children: Secondary | ICD-10-CM | POA: Diagnosis present

## 2014-07-17 DIAGNOSIS — F329 Major depressive disorder, single episode, unspecified: Secondary | ICD-10-CM | POA: Diagnosis present

## 2014-07-17 DIAGNOSIS — M1990 Unspecified osteoarthritis, unspecified site: Secondary | ICD-10-CM | POA: Diagnosis present

## 2014-07-17 DIAGNOSIS — Z87891 Personal history of nicotine dependence: Secondary | ICD-10-CM | POA: Diagnosis not present

## 2014-07-17 DIAGNOSIS — J9819 Other pulmonary collapse: Secondary | ICD-10-CM | POA: Diagnosis present

## 2014-07-17 DIAGNOSIS — G2581 Restless legs syndrome: Secondary | ICD-10-CM | POA: Diagnosis present

## 2014-07-17 DIAGNOSIS — K631 Perforation of intestine (nontraumatic): Secondary | ICD-10-CM | POA: Diagnosis not present

## 2014-07-17 DIAGNOSIS — Z7982 Long term (current) use of aspirin: Secondary | ICD-10-CM | POA: Diagnosis not present

## 2014-07-17 DIAGNOSIS — I10 Essential (primary) hypertension: Secondary | ICD-10-CM | POA: Diagnosis present

## 2014-07-17 DIAGNOSIS — E876 Hypokalemia: Secondary | ICD-10-CM | POA: Diagnosis not present

## 2014-07-17 DIAGNOSIS — W06XXXA Fall from bed, initial encounter: Secondary | ICD-10-CM | POA: Diagnosis present

## 2014-07-17 DIAGNOSIS — S82109A Unspecified fracture of upper end of unspecified tibia, initial encounter for closed fracture: Secondary | ICD-10-CM | POA: Diagnosis present

## 2014-07-17 DIAGNOSIS — K265 Chronic or unspecified duodenal ulcer with perforation: Secondary | ICD-10-CM | POA: Diagnosis present

## 2014-07-17 DIAGNOSIS — I059 Rheumatic mitral valve disease, unspecified: Secondary | ICD-10-CM | POA: Diagnosis present

## 2014-07-17 DIAGNOSIS — S82209A Unspecified fracture of shaft of unspecified tibia, initial encounter for closed fracture: Secondary | ICD-10-CM | POA: Diagnosis present

## 2014-07-17 DIAGNOSIS — M79609 Pain in unspecified limb: Secondary | ICD-10-CM | POA: Diagnosis present

## 2014-07-17 LAB — URINALYSIS, ROUTINE W REFLEX MICROSCOPIC
Glucose, UA: NEGATIVE mg/dL
Hgb urine dipstick: NEGATIVE
Ketones, ur: 15 mg/dL — AB
NITRITE: NEGATIVE
Protein, ur: NEGATIVE mg/dL
SPECIFIC GRAVITY, URINE: 1.026 (ref 1.005–1.030)
UROBILINOGEN UA: 0.2 mg/dL (ref 0.0–1.0)
pH: 6 (ref 5.0–8.0)

## 2014-07-17 LAB — GLUCOSE, CAPILLARY
Glucose-Capillary: 133 mg/dL — ABNORMAL HIGH (ref 70–99)
Glucose-Capillary: 115 mg/dL — ABNORMAL HIGH (ref 70–99)
Glucose-Capillary: 172 mg/dL — ABNORMAL HIGH (ref 70–99)

## 2014-07-17 LAB — COMPREHENSIVE METABOLIC PANEL
ALT: 14 U/L (ref 0–35)
ANION GAP: 12 (ref 5–15)
AST: 20 U/L (ref 0–37)
Albumin: 3.2 g/dL — ABNORMAL LOW (ref 3.5–5.2)
Alkaline Phosphatase: 92 U/L (ref 39–117)
BUN: 17 mg/dL (ref 6–23)
CALCIUM: 9.5 mg/dL (ref 8.4–10.5)
CO2: 26 mEq/L (ref 19–32)
Chloride: 99 mEq/L (ref 96–112)
Creatinine, Ser: 1.21 mg/dL — ABNORMAL HIGH (ref 0.50–1.10)
GFR calc Af Amer: 44 mL/min — ABNORMAL LOW (ref >90)
GFR calc non Af Amer: 38 mL/min — ABNORMAL LOW (ref >90)
GLUCOSE: 135 mg/dL — AB (ref 70–99)
Potassium: 4.8 mEq/L (ref 3.7–5.3)
SODIUM: 137 meq/L (ref 137–147)
TOTAL PROTEIN: 6.2 g/dL (ref 6.0–8.3)
Total Bilirubin: 0.4 mg/dL (ref 0.3–1.2)

## 2014-07-17 LAB — URINE MICROSCOPIC-ADD ON

## 2014-07-17 LAB — CBC WITH DIFFERENTIAL/PLATELET
Basophils Absolute: 0 10*3/uL (ref 0.0–0.1)
Basophils Relative: 0 % (ref 0–1)
Eosinophils Absolute: 0 10*3/uL (ref 0.0–0.7)
Eosinophils Relative: 0 % (ref 0–5)
HCT: 34.5 % — ABNORMAL LOW (ref 36.0–46.0)
Hemoglobin: 11.5 g/dL — ABNORMAL LOW (ref 12.0–15.0)
LYMPHS ABS: 1 10*3/uL (ref 0.7–4.0)
Lymphocytes Relative: 11 % — ABNORMAL LOW (ref 12–46)
MCH: 30 pg (ref 26.0–34.0)
MCHC: 33.3 g/dL (ref 30.0–36.0)
MCV: 90.1 fL (ref 78.0–100.0)
Monocytes Absolute: 0.7 10*3/uL (ref 0.1–1.0)
Monocytes Relative: 7 % (ref 3–12)
Neutro Abs: 7.6 10*3/uL (ref 1.7–7.7)
Neutrophils Relative %: 82 % — ABNORMAL HIGH (ref 43–77)
Platelets: 387 10*3/uL (ref 150–400)
RBC: 3.83 MIL/uL — AB (ref 3.87–5.11)
RDW: 14.5 % (ref 11.5–15.5)
WBC: 9.4 10*3/uL (ref 4.0–10.5)

## 2014-07-17 MED ORDER — LEVOTHYROXINE SODIUM 75 MCG PO TABS
75.0000 ug | ORAL_TABLET | Freq: Every day | ORAL | Status: DC
  Administered 2014-07-17: 75 ug via ORAL
  Filled 2014-07-17 (×3): qty 1

## 2014-07-17 MED ORDER — FUROSEMIDE 40 MG PO TABS
40.0000 mg | ORAL_TABLET | Freq: Every day | ORAL | Status: DC
  Administered 2014-07-17: 40 mg via ORAL
  Filled 2014-07-17 (×2): qty 1

## 2014-07-17 MED ORDER — MORPHINE SULFATE 2 MG/ML IJ SOLN
0.5000 mg | INTRAMUSCULAR | Status: DC | PRN
  Administered 2014-07-17: 0.5 mg via INTRAVENOUS
  Filled 2014-07-17: qty 1

## 2014-07-17 MED ORDER — POLYETHYLENE GLYCOL 3350 17 G PO PACK: 4.0000 g | PACK | Freq: Every day | ORAL | Status: DC | PRN

## 2014-07-17 MED ORDER — PNEUMOCOCCAL VAC POLYVALENT 25 MCG/0.5ML IJ INJ
0.5000 mL | INJECTION | INTRAMUSCULAR | Status: DC
  Filled 2014-07-17: qty 0.5

## 2014-07-17 MED ORDER — ASPIRIN 325 MG PO TABS
325.0000 mg | ORAL_TABLET | Freq: Every day | ORAL | Status: DC
  Administered 2014-07-17: 325 mg via ORAL
  Filled 2014-07-17 (×3): qty 1

## 2014-07-17 MED ORDER — VITAMIN D (ERGOCALCIFEROL) 1.25 MG (50000 UNIT) PO CAPS: 50000.0000 [IU] | ORAL_CAPSULE | ORAL | Status: DC

## 2014-07-17 MED ORDER — OXYCODONE HCL 5 MG PO TABS
5.0000 mg | ORAL_TABLET | ORAL | Status: DC | PRN
  Administered 2014-07-17 (×4): 5 mg via ORAL
  Filled 2014-07-17 (×4): qty 1

## 2014-07-17 MED ORDER — DULOXETINE HCL 60 MG PO CPEP
60.0000 mg | ORAL_CAPSULE | Freq: Every day | ORAL | Status: DC
  Administered 2014-07-17: 60 mg via ORAL
  Filled 2014-07-17 (×2): qty 1

## 2014-07-17 MED ORDER — PROMETHAZINE HCL 25 MG/ML IJ SOLN
6.2500 mg | Freq: Four times a day (QID) | INTRAMUSCULAR | Status: DC | PRN
  Administered 2014-07-17 – 2014-07-22 (×2): 6.25 mg via INTRAVENOUS
  Filled 2014-07-17 (×2): qty 1

## 2014-07-17 MED ORDER — FENTANYL CITRATE 0.05 MG/ML IJ SOLN
100.0000 ug | Freq: Once | INTRAMUSCULAR | Status: AC
  Administered 2014-07-17: 100 ug via INTRAVENOUS
  Filled 2014-07-17: qty 2

## 2014-07-17 MED ORDER — ENOXAPARIN SODIUM 30 MG/0.3ML ~~LOC~~ SOLN
30.0000 mg | SUBCUTANEOUS | Status: DC
  Administered 2014-07-17: 30 mg via SUBCUTANEOUS
  Filled 2014-07-17 (×2): qty 0.3

## 2014-07-17 MED ORDER — IRBESARTAN 150 MG PO TABS
150.0000 mg | ORAL_TABLET | Freq: Every day | ORAL | Status: DC
  Administered 2014-07-17: 150 mg via ORAL
  Filled 2014-07-17 (×2): qty 1

## 2014-07-17 MED ORDER — SODIUM CHLORIDE 0.9 % IV SOLN: Freq: Once | INTRAVENOUS | Status: DC

## 2014-07-17 MED ORDER — SULFAMETHOXAZOLE-TMP DS 800-160 MG PO TABS
1.0000 | ORAL_TABLET | Freq: Every day | ORAL | Status: DC
  Administered 2014-07-17: 1 via ORAL
  Filled 2014-07-17 (×2): qty 1

## 2014-07-17 MED ORDER — LOSARTAN POTASSIUM 25 MG PO TABS: 12.5000 mg | ORAL_TABLET | Freq: Every day | ORAL | Status: DC

## 2014-07-17 MED ORDER — PROMETHAZINE HCL 6.25 MG/5ML PO SYRP
6.2500 mg | ORAL_SOLUTION | Freq: Four times a day (QID) | ORAL | Status: DC | PRN
  Filled 2014-07-17: qty 5

## 2014-07-17 MED ORDER — GABAPENTIN 600 MG PO TABS
600.0000 mg | ORAL_TABLET | Freq: Every day | ORAL | Status: DC
  Administered 2014-07-17: 600 mg via ORAL
  Filled 2014-07-17 (×2): qty 1

## 2014-07-17 MED ORDER — PIROXICAM 20 MG PO CAPS
20.0000 mg | ORAL_CAPSULE | Freq: Every day | ORAL | Status: DC
  Administered 2014-07-17: 20 mg via ORAL
  Filled 2014-07-17 (×2): qty 1

## 2014-07-17 MED ORDER — PANTOPRAZOLE SODIUM 40 MG IV SOLR
40.0000 mg | Freq: Two times a day (BID) | INTRAVENOUS | Status: DC
  Administered 2014-07-18 – 2014-07-24 (×14): 40 mg via INTRAVENOUS
  Filled 2014-07-17 (×17): qty 40

## 2014-07-17 MED ORDER — POTASSIUM CHLORIDE CRYS ER 10 MEQ PO TBCR
10.0000 meq | EXTENDED_RELEASE_TABLET | Freq: Two times a day (BID) | ORAL | Status: DC
  Administered 2014-07-17 (×2): 10 meq via ORAL
  Filled 2014-07-17 (×4): qty 1

## 2014-07-17 MED ORDER — ROPINIROLE HCL 1 MG PO TABS
4.0000 mg | ORAL_TABLET | Freq: Every day | ORAL | Status: DC
  Administered 2014-07-17: 4 mg via ORAL
  Filled 2014-07-17 (×2): qty 4

## 2014-07-17 NOTE — Progress Notes (Signed)
Subjective: Pt denies any significant pain.  No PT yet.  Occasional spasm in left leg.   Objective: Vital signs in last 24 hours: Temp:  [98.3 F (36.8 C)-99.3 F (37.4 C)] 98.3 F (36.8 C) (08/27 1443) Pulse Rate:  [80-95] 88 (08/27 1443) Resp:  [16] 16 (08/27 1443) BP: (101-142)/(48-78) 142/78 mmHg (08/27 1443) SpO2:  [94 %-99 %] 97 % (08/27 1443) Weight:  [67.586 kg (149 lb)] 67.586 kg (149 lb) (08/26 2107)  Intake/Output from previous day: 08/26 0701 - 08/27 0700 In: 550 [I.V.:550] Out: -  Intake/Output this shift: Total I/O In: 480 [P.O.:480] Out: -    Recent Labs  07/17/14 0012  HGB 11.5*    Recent Labs  07/17/14 0012  WBC 9.4  RBC 3.83*  HCT 34.5*  PLT 387    Recent Labs  07/17/14 0012  NA 137  K 4.8  CL 99  CO2 26  BUN 17  CREATININE 1.21*  GLUCOSE 135*  CALCIUM 9.5   No results found for this basename: LABPT, INR,  in the last 72 hours  PE:  elderly woman in nad.  HOH.  L leg with varus at the knee.  Skin is healthy and intact.  Minimal swelling.  TTP at proximal tibia.  5/5 strength at quad and hamstring on the L.  Assessment/Plan: L tibia fracture - given her low functional demand and normal alignment, I believe this fracture can be treated successfully in closed fashion.  We applied a LLC today with the knee flexed about 30 deg.  She can have PT and OT consults for NWB ambulation.  She may need SNF placement for acute rehab.   Toni Arthurs 07/17/2014, 3:55 PM

## 2014-07-17 NOTE — Consult Note (Signed)
Reason for Consult: closed, nondisplaced fracture of L proximal tibia shaft  Referring Physician: Hoyle Sauer, MD  Kelli Pena is an 80 y.o. female.  HPI: Kelli Pena is a pleasant 80 yo female with PMHx significant for HTN, DMII, and osteoarthritis. She has been followed for a L tibia stress fracture for the past several months at [facility]. On 8/26, Kelli Pena was in her bathroom when she slipped an fell onto her L leg.  She reports immediate pain into her L leg.  She denies any other injuries and did not hit her head. She denies any LOC or amnesia of the events. After her fall, the pt crawled into her bedroom to reach the phone to call for help.  She was unable to bear weight.  She was taken to [facility] and was found to have a closed, nondisplaced fracture of the L proximal tibia shaft. She was put into a posterior long-leg splint and admitted for observation.   Today, 8/27, she reports that her pain is well controlled and that she has no new complaints. She reports that she is hopeful she does not require a surgery to fix her leg, and would like to get home when possible. She is a non-smoker. She states that her daughter and friends will be staying with her and helping her for the next several weeks. She denies any significant FHx other than CVA for her father.  Past Medical History  Diagnosis Date  . Hypertension   . Diabetes mellitus without complication   . Arthritis   . Hypothyroid   . Fibromyalgia   . Complication of anesthesia     trouble waking up, very cold, hypotension    Past Surgical History  Procedure Laterality Date  . Hip surgery    . Cholecystectomy    . Eye surgery      Family History  Problem Relation Age of Onset  . Stroke Father   . Arthritis Sister   . Arthritis Daughter     Social History:  reports that she quit smoking about 48 years ago. Her smoking use included Cigarettes. She has a 30 pack-year smoking history. She has never used smokeless  tobacco. She reports that she drinks alcohol. She reports that she does not use illicit drugs.  Allergies:  Allergies  Allergen Reactions  . Tetanus Toxoids Swelling    Medications: I have reviewed the patient's current medications.  Results for orders placed during the hospital encounter of 07/16/14 (from the past 48 hour(s))  CBC WITH DIFFERENTIAL     Status: Abnormal   Collection Time    07/17/14 12:12 AM      Result Value Ref Range   WBC 9.4  4.0 - 10.5 K/uL   RBC 3.83 (*) 3.87 - 5.11 MIL/uL   Hemoglobin 11.5 (*) 12.0 - 15.0 g/dL   HCT 34.5 (*) 36.0 - 46.0 %   MCV 90.1  78.0 - 100.0 fL   MCH 30.0  26.0 - 34.0 pg   MCHC 33.3  30.0 - 36.0 g/dL   RDW 14.5  11.5 - 15.5 %   Platelets 387  150 - 400 K/uL   Neutrophils Relative % 82 (*) 43 - 77 %   Neutro Abs 7.6  1.7 - 7.7 K/uL   Lymphocytes Relative 11 (*) 12 - 46 %   Lymphs Abs 1.0  0.7 - 4.0 K/uL   Monocytes Relative 7  3 - 12 %   Monocytes Absolute 0.7  0.1 - 1.0  K/uL   Eosinophils Relative 0  0 - 5 %   Eosinophils Absolute 0.0  0.0 - 0.7 K/uL   Basophils Relative 0  0 - 1 %   Basophils Absolute 0.0  0.0 - 0.1 K/uL  COMPREHENSIVE METABOLIC PANEL     Status: Abnormal   Collection Time    07/17/14 12:12 AM      Result Value Ref Range   Sodium 137  137 - 147 mEq/L   Potassium 4.8  3.7 - 5.3 mEq/L   Chloride 99  96 - 112 mEq/L   CO2 26  19 - 32 mEq/L   Glucose, Bld 135 (*) 70 - 99 mg/dL   BUN 17  6 - 23 mg/dL   Creatinine, Ser 1.21 (*) 0.50 - 1.10 mg/dL   Calcium 9.5  8.4 - 10.5 mg/dL   Total Protein 6.2  6.0 - 8.3 g/dL   Albumin 3.2 (*) 3.5 - 5.2 g/dL   AST 20  0 - 37 U/L   ALT 14  0 - 35 U/L   Alkaline Phosphatase 92  39 - 117 U/L   Total Bilirubin 0.4  0.3 - 1.2 mg/dL   GFR calc non Af Amer 38 (*) >90 mL/min   GFR calc Af Amer 44 (*) >90 mL/min   Comment: (NOTE)     The eGFR has been calculated using the CKD EPI equation.     This calculation has not been validated in all clinical situations.     eGFR's  persistently <90 mL/min signify possible Chronic Kidney     Disease.   Anion gap 12  5 - 15  URINALYSIS, ROUTINE W REFLEX MICROSCOPIC     Status: Abnormal   Collection Time    07/17/14  5:02 AM      Result Value Ref Range   Color, Urine YELLOW  YELLOW   APPearance CLEAR  CLEAR   Specific Gravity, Urine 1.026  1.005 - 1.030   pH 6.0  5.0 - 8.0   Glucose, UA NEGATIVE  NEGATIVE mg/dL   Hgb urine dipstick NEGATIVE  NEGATIVE   Bilirubin Urine SMALL (*) NEGATIVE   Ketones, ur 15 (*) NEGATIVE mg/dL   Protein, ur NEGATIVE  NEGATIVE mg/dL   Urobilinogen, UA 0.2  0.0 - 1.0 mg/dL   Nitrite NEGATIVE  NEGATIVE   Leukocytes, UA TRACE (*) NEGATIVE  URINE MICROSCOPIC-ADD ON     Status: Abnormal   Collection Time    07/17/14  5:02 AM      Result Value Ref Range   Squamous Epithelial / LPF FEW (*) RARE   WBC, UA 3-6  <3 WBC/hpf   RBC / HPF 0-2  <3 RBC/hpf   Bacteria, UA RARE  RARE   Casts HYALINE CASTS (*) NEGATIVE  GLUCOSE, CAPILLARY     Status: Abnormal   Collection Time    07/17/14  6:45 AM      Result Value Ref Range   Glucose-Capillary 133 (*) 70 - 99 mg/dL    Dg Chest 2 View  07/16/2014   CLINICAL DATA:  [place] this morning.  EXAM: CHEST  2 VIEW  COMPARISON:  07/22/2005.  FINDINGS: Normal sized heart. Clear lungs. Thoracic spine degenerative changes. 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 No fracture or pneumothorax.  IMPRESSION: No acute abnormality.   Electronically Signed   By: Enrique Sack M.D.   On: 07/16/2014 23:34   Dg Pelvis 1-2 Views  07/16/2014   CLINICAL DATA:  Fall  EXAM: PELVIS - 1-2 VIEW  COMPARISON:  Prior radiograph from 02/01/2011  FINDINGS: Right total hip arthroplasty in place. The acetabular and femoral components articulate normally with 1 another. No acute fracture or dislocation. Bony pelvis is intact. SI joints are approximated. No pubic dye stasis.  Prominent degenerative changes noted within the lower lumbar spine.  No  soft tissue abnormality.  Osteopenia noted. Vascular calcifications present within the upper thighs.  IMPRESSION: 1. No acute fracture or dislocation. 2. Right hip arthroplasty in place without evidence of hardware complication.   Electronically Signed   By: Jeannine Boga M.D.   On: 07/16/2014 23:34   Dg Femur Left  07/16/2014   CLINICAL DATA:  [place] at 6 a.m. this morning.  Left lower leg pain.  EXAM: LEFT FEMUR - 2 VIEW  COMPARISON:  None.  FINDINGS: There is no evidence of fracture or other focal bone lesions. Soft tissues are unremarkable. Prominent degenerative changes in the left knee. Vascular calcifications.  IMPRESSION: No acute bony abnormalities.   Electronically Signed   By: Lucienne Capers M.D.   On: 07/16/2014 23:31   Dg Tibia/fibula Left  07/16/2014   CLINICAL DATA:  Left lower leg pain after a fall at 6 a.m. this morning.  EXAM: LEFT TIBIA AND FIBULA - 2 VIEW  COMPARISON:  12/31/2013  FINDINGS: Acute mostly transverse fracture of the proximal/ mid shaft of the left tibia without significant displacement. Marked degenerative changes in the left knee. Left fibula appears intact.  IMPRESSION: Fracture of the proximal/ mid shaft of the left tibia without significant displacement.   Electronically Signed   By: Lucienne Capers M.D.   On: 07/16/2014 23:32    ROS:  (-) for fever, chills, fatigue, change in weight, N/V, HA, CP, SOB, calf pain, calf swelling. (+) for pain in LLE. PE: WD/WN elderly female in nad. A and O x4. Mood and affect are appropriate. She is hard of hearing and hears best on her L side. EOMI. Respirations normal and unlabored. LLE in posterior long-leg splint with ace bandage. Dressings are C/D/I. NV intact with brisk capillary refill and good motor function of toes. 5/5 strength of toe flexion and extension. Distal sensation intact bilaterally. No calf or thigh swelling. No palpable cords of LLE.  Blood pressure 131/48, pulse 91, temperature 98.4 F (36.9 C),  temperature source Oral, resp. rate 16, height _0  (1.499 m), weight 67.586 kg (149 lb), SpO2 98.00%.   Assessment/Plan: 1) closed, nondisplaced fracture of L proximal tibia shaft   -Non-operative treatment for this injury; we will put her into a long-leg cast and keep her NWB LLE  -Will cancel NPO order so she can eat  -PT, OT, CM, SW to eval and treat   Marianita Botkin HOWELLS 07/17/2014, 7:55 AM

## 2014-07-17 NOTE — Progress Notes (Signed)
Orthopedic Tech Progress Note Patient Details:  Kelli Pena Apr 22, 1920 [id number] Applied fiberglass posterior long leg splint and fiberglass stirrup splint to LLE.  Pulses, sensation, motion intact before and after splinting.  Capillary refill less than 2 seconds before and after splinting. Ortho Devices Type of Ortho Device: Post (long leg) splint;Stirrup splint Ortho Device/Splint Location: LLE Ortho Device/Splint Interventions: Application   Lesle Chris 07/17/2014, 1:17 AM

## 2014-07-17 NOTE — Care Management Note (Addendum)
CARE MANAGEMENT NOTE 07/17/2014  Patient:  SCOTLYN, MCCRANIE   Account Number:  [id number]  Date Initiated:  07/17/2014  Documentation initiated by:  Vance Peper  Subjective/Objective Assessment:   80 yr old female admitted with left tib/fib closed non displaced fracture s/p fall. non operative.     Action/Plan:   Case manager spoke with patient concerning need for home health. Choice offered. Referral called to Tamala Bari, [facility] liaison. CM will continue to monitor   Anticipated DC Date:  07/19/2014   Anticipated DC Plan:  HOME W HOME HEALTH SERVICES      DC Planning Services  CM consult      [facility] Choice  HOME HEALTH   Choice offered to / List presented to:  C-1 Patient        HH arranged  HH-2 PT  HH-3 OT  HH-4 NURSE'S AIDE      HH agency  [facility]   Status of service:  In process, will continue to follow    Comments 07/17/14  1501 Vance Peper, RN BSN Case Manager Patient states she lives home alone, but has neighbors on either side and her daughter and son-in-law come to check on her. Case manager will speak with daughter concerning patient's overnight care.

## 2014-07-17 NOTE — Evaluation (Signed)
Physical Therapy Evaluation Patient Details Name: Kelli Pena MRN: [id number] DOB: 1920-08-11 Today's Date: 07/17/2014   History of Present Illness  80 y.o. female presents with closed, nondisplaced fracture of L proximal tibia shaft .  Clinical Impression  Patient admitted with above, presenting with  limitations due to the deficits listed below (see PT Problem List). Kelli Pena is a very pleasant individual. Highly unstable with impulsive movements during transfer training requiring Max assist for stability and to protect LLE from weight bearing. She is very hard of hearing making immediate verbal cues difficult for her to understand during therapy training. She is however, very motivated to improve and willing to participate in therapy. She requests to go to [facility] upon d/c from hospital. Patient will benefit from skilled PT to increase their independence and safety with mobility to allow discharge to the venue listed below.      Follow Up Recommendations SNF (Requests [facility] - Has been here previously)    Equipment Recommendations  None recommended by PT (TBD at next venue, possibly w/c)    Recommendations for Other Services       Precautions / Restrictions Precautions Precautions: Fall Restrictions Weight Bearing Restrictions: Yes LLE Weight Bearing: Non weight bearing      Mobility  Bed Mobility Overal bed mobility: Needs Assistance Bed Mobility: Supine to Sit     Supine to sit: Min assist;HOB elevated     General bed mobility comments: Min assist for LLE support off of bed and to scoot forward. VC for technique and heavy use of rail.  Transfers Overall transfer level: Needs assistance Equipment used: Rolling walker (2 wheeled) Transfers: Sit to/from [employer] Sit to Stand: Mod assist Stand pivot transfers: Max assist       General transfer comment: Mod assist for boost and balance to stand. Max verbal and tactile cues for  hand placement. Signficant posterior lean initially. Max assist for pivot transfer. Very impulsive walker placement and balance loss. Unable to safely maintain NWB on LLE; assist for balance, NWB on LLE, and walker control. Performed from bed to [facility] and [facility] to chair. Educated on safe DME use with walker.  Ambulation/Gait                Stairs            Wheelchair Mobility    Modified Rankin (Stroke Patients Only)       Balance Overall balance assessment: Needs assistance;History of Falls Sitting-balance support: Feet supported;Single extremity supported Sitting balance-Kelli Pena Scale: Poor     Standing balance support: Bilateral upper extremity supported Standing balance-Kelli Pena Scale: Zero                               Pertinent Vitals/Pain Pain Assessment: 0-10 Pain Score: 8  Pain Location: LLE Pain Intervention(s): Limited activity within patient's tolerance;Monitored during session;Repositioned;Patient requesting pain meds-RN notified    Home Living Family/patient expects to be discharged to:: Skilled nursing facility Living Arrangements: Alone Available Help at Discharge: Skilled Nursing Facility           Home Equipment: Dan Humphreys - 2 wheels;Bedside commode      Prior Function Level of Independence: Needs assistance   Gait / Transfers Assistance Needed: RW for ambulation  ADL's / Homemaking Assistance Needed: Daughter assisted at home        Hand Dominance   Dominant Hand: Right    Extremity/Trunk Assessment  Upper Extremity Assessment: Defer to OT evaluation           Lower Extremity Assessment: LLE deficits/detail   LLE Deficits / Details: Unable to fully assess due to casting.     Communication   Communication: HOH  Cognition Arousal/Alertness: Awake/alert Behavior During Therapy: WFL for tasks assessed/performed Overall Cognitive Status: Within Functional Limits for tasks assessed                       General Comments General comments (skin integrity, edema, etc.): Educated on safe mobility with WB precautions. Discussed d/c planning and role of PT for increasing functional independence.    Exercises        Assessment/Plan    PT Assessment Patient needs continued PT services  PT Diagnosis Difficulty walking;Abnormality of gait;Acute pain   PT Problem List Decreased strength;Decreased range of motion;Decreased activity tolerance;Decreased balance;Decreased mobility;Decreased coordination;Decreased knowledge of use of DME;Decreased knowledge of precautions;Pain  PT Treatment Interventions DME instruction;Gait training;Functional mobility training;Therapeutic activities;Therapeutic exercise;Balance training;Neuromuscular re-education;Patient/family education;Modalities   PT Goals (Current goals can be found in the Care Plan section) Acute Rehab PT Goals Patient Stated Goal: Get back home again PT Goal Formulation: With patient Time For Goal Achievement: 07/24/14 Potential to Achieve Goals: Good    Frequency Min 3X/week   Barriers to discharge Decreased caregiver support pt lives alone    Co-evaluation               End of Session   Activity Tolerance: Patient tolerated treatment well Patient left: in chair;with call bell/phone within reach Nurse Communication: Mobility status;Weight bearing status         Time: 1610-9604 PT Time Calculation (min): 38 min   Charges:   PT Evaluation $Initial PT Evaluation Tier I: 1 Procedure PT Treatments $Therapeutic Activity: 8-22 mins $Self Care/Home Management: 8-22   PT G Codes:         Charlsie Merles, PT [phone]  Berton Mount 07/17/2014, 6:09 PM

## 2014-07-17 NOTE — Progress Notes (Signed)
Utilization review completed.  

## 2014-07-18 ENCOUNTER — Encounter (HOSPITAL_COMMUNITY): Payer: Self-pay | Admitting: General Surgery

## 2014-07-18 ENCOUNTER — Inpatient Hospital Stay (HOSPITAL_COMMUNITY): Payer: Medicare Other

## 2014-07-18 DIAGNOSIS — S82209A Unspecified fracture of shaft of unspecified tibia, initial encounter for closed fracture: Secondary | ICD-10-CM

## 2014-07-18 DIAGNOSIS — R4182 Altered mental status, unspecified: Secondary | ICD-10-CM

## 2014-07-18 DIAGNOSIS — I10 Essential (primary) hypertension: Secondary | ICD-10-CM

## 2014-07-18 DIAGNOSIS — K668 Other specified disorders of peritoneum: Secondary | ICD-10-CM

## 2014-07-18 DIAGNOSIS — R6510 Systemic inflammatory response syndrome (SIRS) of non-infectious origin without acute organ dysfunction: Secondary | ICD-10-CM

## 2014-07-18 LAB — GLUCOSE, CAPILLARY
GLUCOSE-CAPILLARY: 157 mg/dL — AB (ref 70–99)
Glucose-Capillary: 123 mg/dL — ABNORMAL HIGH (ref 70–99)
Glucose-Capillary: 120 mg/dL — ABNORMAL HIGH (ref 70–99)
Glucose-Capillary: 114 mg/dL — ABNORMAL HIGH (ref 70–99)

## 2014-07-18 LAB — CBC
HEMATOCRIT: 40 % (ref 36.0–46.0)
Hemoglobin: 13.1 g/dL (ref 12.0–15.0)
MCH: 29.6 pg (ref 26.0–34.0)
MCHC: 32.8 g/dL (ref 30.0–36.0)
MCV: 90.3 fL (ref 78.0–100.0)
Platelets: 452 10*3/uL — ABNORMAL HIGH (ref 150–400)
RBC: 4.43 MIL/uL (ref 3.87–5.11)
RDW: 14.6 % (ref 11.5–15.5)
WBC: 17 10*3/uL — ABNORMAL HIGH (ref 4.0–10.5)

## 2014-07-18 LAB — TSH: TSH: 1.21 u[IU]/mL (ref 0.350–4.500)

## 2014-07-18 LAB — BASIC METABOLIC PANEL
Anion gap: 14 (ref 5–15)
BUN: 22 mg/dL (ref 6–23)
CO2: 23 mEq/L (ref 19–32)
CREATININE: 1.28 mg/dL — AB (ref 0.50–1.10)
Calcium: 9.2 mg/dL (ref 8.4–10.5)
Chloride: 102 mEq/L (ref 96–112)
GFR, EST AFRICAN AMERICAN: 41 mL/min — AB (ref >90)
GFR, EST NON AFRICAN AMERICAN: 35 mL/min — AB (ref >90)
GLUCOSE: 123 mg/dL — AB (ref 70–99)
Potassium: 5.1 mEq/L (ref 3.7–5.3)
Sodium: 139 mEq/L (ref 137–147)

## 2014-07-18 LAB — URINALYSIS, ROUTINE W REFLEX MICROSCOPIC
Bilirubin Urine: NEGATIVE
Glucose, UA: NEGATIVE mg/dL
Hgb urine dipstick: NEGATIVE
Ketones, ur: 15 mg/dL — AB
LEUKOCYTES UA: NEGATIVE
Nitrite: NEGATIVE
PH: 5.5 (ref 5.0–8.0)
PROTEIN: NEGATIVE mg/dL
Specific Gravity, Urine: 1.022 (ref 1.005–1.030)
Urobilinogen, UA: 0.2 mg/dL (ref 0.0–1.0)

## 2014-07-18 LAB — PROTIME-INR
INR: 1.11 (ref 0.00–1.49)
PROTHROMBIN TIME: 14.3 s (ref 11.6–15.2)

## 2014-07-18 LAB — VITAMIN B12: VITAMIN B 12: 317 pg/mL (ref 211–911)

## 2014-07-18 LAB — LACTIC ACID, PLASMA: Lactic Acid, Venous: 1.1 mmol/L (ref 0.5–2.2)

## 2014-07-18 LAB — AMMONIA: Ammonia: 19 umol/L (ref 11–60)

## 2014-07-18 MED ORDER — TRAMADOL HCL 50 MG PO TABS: 50.0000 mg | ORAL_TABLET | Freq: Four times a day (QID) | ORAL | Status: DC

## 2014-07-18 MED ORDER — TRAMADOL HCL 50 MG PO TABS: 50.0000 mg | ORAL_TABLET | Freq: Four times a day (QID) | ORAL | Status: DC | PRN

## 2014-07-18 MED ORDER — PIPERACILLIN-TAZOBACTAM 3.375 G IVPB
3.3750 g | Freq: Three times a day (TID) | INTRAVENOUS | Status: DC
  Administered 2014-07-18 – 2014-07-25 (×21): 3.375 g via INTRAVENOUS
  Filled 2014-07-18 (×26): qty 50

## 2014-07-18 MED ORDER — ASPIRIN 300 MG RE SUPP
300.0000 mg | Freq: Every day | RECTAL | Status: DC
  Administered 2014-07-19 – 2014-07-21 (×3): 300 mg via RECTAL
  Filled 2014-07-18 (×6): qty 1

## 2014-07-18 MED ORDER — FUROSEMIDE 10 MG/ML IJ SOLN
20.0000 mg | Freq: Every day | INTRAMUSCULAR | Status: DC
  Administered 2014-07-18 – 2014-07-24 (×7): 20 mg via INTRAVENOUS
  Filled 2014-07-18 (×8): qty 2

## 2014-07-18 MED ORDER — ENOXAPARIN SODIUM 30 MG/0.3ML ~~LOC~~ SOLN
30.0000 mg | SUBCUTANEOUS | Status: DC
  Administered 2014-07-18 – 2014-07-22 (×5): 30 mg via SUBCUTANEOUS
  Filled 2014-07-18 (×6): qty 0.3

## 2014-07-18 MED ORDER — IOHEXOL 350 MG/ML SOLN
100.0000 mL | Freq: Once | INTRAVENOUS | Status: AC | PRN
  Administered 2014-07-18: 70 mL via INTRAVENOUS

## 2014-07-18 MED ORDER — LEVOTHYROXINE SODIUM 100 MCG IV SOLR
37.5000 ug | Freq: Every day | INTRAVENOUS | Status: DC
  Administered 2014-07-19 – 2014-07-25 (×7): 37.5 ug via INTRAVENOUS
  Filled 2014-07-18 (×8): qty 5

## 2014-07-18 MED ORDER — SODIUM CHLORIDE 0.9 % IV SOLN
INTRAVENOUS | Status: DC
  Administered 2014-07-18 – 2014-07-20 (×2): via INTRAVENOUS

## 2014-07-18 MED ORDER — ACETAMINOPHEN 325 MG PO TABS: 325.0000 mg | ORAL_TABLET | Freq: Four times a day (QID) | ORAL | Status: DC | PRN

## 2014-07-18 MED ORDER — FENTANYL CITRATE 0.05 MG/ML IJ SOLN
25.0000 ug | INTRAMUSCULAR | Status: DC | PRN
  Administered 2014-07-18 – 2014-07-23 (×18): 25 ug via INTRAVENOUS
  Administered 2014-07-23 – 2014-07-25 (×4): 50 ug via INTRAVENOUS
  Administered 2014-07-25: 25 ug via INTRAVENOUS
  Filled 2014-07-18 (×25): qty 2

## 2014-07-18 MED ORDER — ACETAMINOPHEN 650 MG RE SUPP: 650.0000 mg | RECTAL | Status: DC | PRN

## 2014-07-18 NOTE — Progress Notes (Signed)
2140- Pt given night time medicine and PRN 5 mg oxycodone IR per pt request. Pt stated pain level 7/10. Pt also given scheduled night time medications that pt also takes at home (see MAR). After ingestion of medications pt then began to feel sharp pains in her mid to lower abdomen. Pt then became very nervous and stated that "my husband died after sharp pains and indigestion in his stomach". Pt then began to perspirate and vomit green colored emesis with brown/black colored specks. Avel Peace, the PA on call, was then notified. Orders given to RN for STAT KUB, 6.25 mg IV phenergan PRN q4-6h, IV protonix Q12H, and to d/c oxycodone IR. All orders were then carried out. KUB came back with no significant acute findings.   2350- Upon re-assessment of pt N/V resolved and pt resting in bed. Legs and arms are now restless and pt cannot stay still. Speech is slurred but comprehensible at times. Pt is alert to self and time only, she cannot verify where she is or why she is here, but is reoriented for a minute at a time. Rapid response is then called at this time. (see rapid response note). PA then paged again, notified, and gave RN instructions to monitor and place orders to have morning BMP and CBC labs drawn. PA also gave instrutions to monitor pt and believes pt has a build up of oxycodone that was given throughout the day and one dose in the evening. Gives further instruction to only give acetaminophen q6h for mild pain and tramadol q6h moderate pain. No pain medicine were given at this time.     0200- Pt continues to have upper and lower extremity shaky or tremor like movements. Pt still having slurred speech but is responsive and is showing by body language as well as non verbal cues that pt does understand questions. And can also answer questions with clear speech at times. Pt is still alert to self and time only. VSS (see docflowsheets). Nursing is continuing to monitor.  1610- At this time pt is still  continuing to show the same signs and symptoms as seen at 0200. Rapid response is notified again and comes to see the patient (see rapid response note). Coherent vocal responses are few, but pt is still alert to self and time only. VSS except elevated oral temperature of 100.8. D/t uncertainty of accurate temp a rectal temp was taken and resulted to be 101.8. Lab work completed and found elevated WBC's, PA paged once again and notified of abnormal findings. PA then given orders to have STAT chest x-ray & UA w/ cultures. Pt attempted to void on bedpan, however was unsuccesful.  0700- PA Christiana Fuchs came to see pt and was experiencing some difficulty arousing pt, she then assessed the patient and was given all information regarding the events from the night before by the night time nurse. Full report was also given to day time nurse, and a consult for internal medicine has now been ordered. Nursing will continue to monitor patient.

## 2014-07-18 NOTE — Clinical Social Work Psychosocial (Signed)
Clinical Social Work Department BRIEF PSYCHOSOCIAL ASSESSMENT 07/18/2014  Patient:  Kelli Pena, Kelli Pena     Account Number:  [id number]     Admit date:  07/16/2014  Clinical Social Worker:  Read Drivers  Date/Time:  07/17/2014 11:29 AM  Referred by:  Physician  Date Referred:  07/17/2014 Referred for  SNF Placement  Psychosocial assessment   Other Referral:   none   Interview type:  Other - See comment Other interview type:   daughter, Kelli Pena,Kelli Pena    PSYCHOSOCIAL DATA Living Status:  ALONE Admitted from facility:   Level of care:   Primary support name:  Kelli Pena Primary support relationship to patient:  CHILD, ADULT Degree of support available:   adequate    CURRENT CONCERNS Current Concerns  Post-Acute Placement   Other Concerns:   none    SOCIAL WORK ASSESSMENT / PLAN CSW spoke with daughter Kelli Pena who reports that pt has been to [facility] prior for STR and feels that [name] would be apprproiate during this stay as well.  Daughter is familiar with the STR/SNF process though CSW re-reviewed it with her.   Assessment/plan status:  Psychosocial Support/Ongoing Assessment of Needs Other assessment/ plan:   FL2  PASARR- should be existing   Information/referral to community resources:   SNF    PATIENT'S/FAMILY'S RESPONSE TO PLAN OF CARE: Pt and daughter are both agreeable to SNF/STR upon medical discharge.     Kelli Pena, LCSWA [phone]  Psychiatric & Orthopedics (5N 1-16) Clinical Social Worker

## 2014-07-18 NOTE — Consult Note (Signed)
Free air likely from perforated colon. I had a long talk with her daughter and her daughter decided the patient would not want to go through surgery with likely colostomy and ventilator support. We discussed this at length and I answered her questions. We will respect their wishes. ABX and pain meds PRN. I also spoke with Dr. Victorino Dike. Patient examined and I agree with the assessment and plan  Violeta Gelinas, MD, MPH, FACS Trauma: [phone] General Surgery: [phone]  07/18/2014 3:26 PM

## 2014-07-18 NOTE — ED Provider Notes (Signed)
I saw and evaluated the patient, reviewed the resident's note and I agree with the findings and plan.   EKG Interpretation None     Patient with fall. Tibial fracture. Will admit.  Juliet Rude. Rubin Payor, MD 07/18/14 2249

## 2014-07-18 NOTE — Progress Notes (Signed)
PT Cancellation Note  Patient Details Name: Kelli Pena MRN: [id number] DOB: 05/01/1920   Cancelled Treatment:    Reason Eval/Treat Not Completed: Medical issues which prohibited therapy. Patient awaiting scan to R/O PE. Will follow up as appropriate   Jia Dottavio, Adline Potter 07/18/2014, 9:30 AM

## 2014-07-18 NOTE — Progress Notes (Signed)
Dr and daughter in rm pt more alert talking with daughter and daughter hearing aides in

## 2014-07-18 NOTE — Progress Notes (Signed)
Called to room at 0445 per floor RN for pt with worsening confusion. Pt originally assessed at 0015 due to increasing confusion after emesis and phenergan IV dose. Pt confused but able to state name and move all extremities well.  At that time D. Julien Girt NP for Dr. Victorino Dike paged and updated on confusion per floor RN. Morning labs ordered and narcotics discontinued. Upon my arrival at 0455 pt found resting in bed. Noticed jerking in extremities, but does not appear to be seizure activity. Once hearing aids replaced in ears pt able to follow commands and oriented to self and time only. Per floor RN pt is severely different from her baseline. Pt warm to touch, oral temp 100.8. Rectal temp done yielding 101.8. WBC this morning increased from 9 (8/27) to 17 (8/28).  No obvious signs of infection. Gareth Eagle NP paged to update on pt mental status, VS and new lab results. Orders received for CXR and UA/UC. Floor RN to monitor closely.

## 2014-07-18 NOTE — Consult Note (Signed)
Kelli Pena 12/27/1919  [id number].   Requesting MD: Dr. Wylene Simmer Chief Complaint/Reason for Consult: free intraperitoneal air HPI: This is a 80 yo white female with a history of HTN and DM who has been followed by ortho for several weeks with a left tibia stress fracture.  She fell in her bathroom 2 days ago and had severe left leg pain.  She came to [facility] and was found to have a nondisplaced left tibia fracture.  She had a long cast placed and was admitted for observation.  She was doing ok until this morning when she became difficult to arouse.  She apparently had some bilious emesis overnight per nursing.  The patient is unable to provide a history currently, so this is all per the chart.  Her WBC went to 17K today from [place] yesterday.  She became tachycardic.  A CTA of the chest was completed to rule out PE.  On this film, she was noted to have free intraperitoneal air.  We have been called to see the patient for further evaluation.  We did speak to the daughter on the phone.  She states the patient struggles with constipation and does give herself enemas.  She does not think she has had a BM in 5 days.  ROS : unable to fully obtain due to altered mental status.  She intermittently admits to some abdominal pain.  Family History  Problem Relation Age of Onset  . Stroke Father   . Arthritis Sister   . Arthritis Daughter     Past Medical History  Diagnosis Date  . Hypertension   . Diabetes mellitus without complication   . Arthritis   . Hypothyroid   . Fibromyalgia   . Complication of anesthesia     trouble waking up, very cold, hypotension    Past Surgical History  Procedure Laterality Date  . Hip surgery    . Cholecystectomy    . Eye surgery      Social History:  reports that she quit smoking about 48 years ago. Her smoking use included Cigarettes. She has a 30 pack-year smoking history. She has never used smokeless tobacco. She reports that she drinks alcohol. She reports  that she does not use illicit drugs.  Allergies:  Allergies  Allergen Reactions  . Tetanus Toxoids Swelling    Medications Prior to Admission  Medication Sig Dispense Refill  . aspirin 325 MG tablet Take 325 mg by mouth daily with breakfast.      . diclofenac sodium (VOLTAREN) 1 % GEL Apply 4 g topically 4 (four) times daily as needed (for pain).       . DULoxetine (CYMBALTA) 60 MG capsule Take 60 mg by mouth every morning.       . furosemide (LASIX) 40 MG tablet Take 40 mg by mouth daily.      Marland Kitchen gabapentin (NEURONTIN) 600 MG tablet Take 600 mg by mouth at bedtime as needed (for pain).       Marland Kitchen HYDROcodone-acetaminophen (NORCO/VICODIN) 5-325 MG per tablet Take 1 tablet by mouth every 6 (six) hours as needed for moderate pain.       Marland Kitchen irbesartan (AVAPRO) 150 MG tablet Take 150 mg by mouth daily.      Marland Kitchen levothyroxine (SYNTHROID, LEVOTHROID) 75 MCG tablet Take 75 mcg by mouth daily before breakfast.      . lidocaine (LIDODERM) 5 % Place 3 patches onto the skin daily as needed (for pain). Apply to both knees and right shoulder.  Remove & Discard patch within 12 hours or as directed by MD      . Multiple Vitamin (MULTIVITAMIN) tablet Take 1 tablet by mouth daily.      . piroxicam (FELDENE) 20 MG capsule Take 20 mg by mouth daily. With food      . polyethylene glycol (MIRALAX / GLYCOLAX) packet Take 4-17 g by mouth daily as needed (constipation).      Marland Kitchen POTASSIUM PO Take 1 tablet by mouth daily.       Marland Kitchen rOPINIRole (REQUIP) 4 MG tablet Take 4 mg by mouth at bedtime.      . sulfamethoxazole-trimethoprim (BACTRIM DS) 800-160 MG per tablet Take 1 tablet by mouth 2 (two) times daily. For 10 days. Started 07/11/14. Last dose should be 07/20/14.      Marland Kitchen traMADol (ULTRAM) 50 MG tablet Take 50 mg by mouth every 6 (six) hours as needed for moderate pain.       . Vitamin D, Ergocalciferol, (DRISDOL) 50000 UNITS CAPS capsule Take 50,000 Units by mouth every 7 (seven) days. Saturday.        Blood pressure  93/65, pulse 107, temperature 100.8 F (38.2 C), temperature source Oral, resp. rate 16, height '4\' 11"'  (1.499 m), weight 149 lb (67.586 kg), SpO2 95.00%. Physical Exam: General: elderly, WD, WN white female who is laying in bed in NAD, but with occasional jerking motions (daughters states this is from her requip) HEENT: head is normocephalic, atraumatic.  Sclera are noninjected.  Right pupil is dilated (secondary to eye surgery), left pupil is round and reactive.  Ears and nose without any masses or lesions.  Mouth is pink and moist Heart: regular, rate, and rhythm.  Normal s1,s2. No obvious murmurs, gallops, or rubs noted.  Palpable radial and pedal pulses bilaterally Lungs: CTAB, no wheezes, rhonchi, or rales noted.  Respiratory effort nonlabored Abd: soft, intermittently tender on the left side of her abdomen, ND, +BS, no masses, hernias, or organomegaly, no guarding or peritoneal signs noted MS: all 4 extremities are symmetrical with no cyanosis, clubbing, or edema, except her left leg is in a fiberglass long cast Skin: warm and dry with no masses, lesions, or rashes Psych: initially upon my exam, the patient would not respond to verbal commands, speak, or respond to a sternal rub.  However, when Dr. Grandville Silos went in the patient opened her eyes and conversed with him and would answer some simple questions, but could not carry a normal conversation due to AMS    Results for orders placed during the hospital encounter of 07/16/14 (from the past 48 hour(s))  CBC WITH DIFFERENTIAL     Status: Abnormal   Collection Time    07/17/14 12:12 AM      Result Value Ref Range   WBC 9.4  4.0 - 10.5 K/uL   RBC 3.83 (*) 3.87 - 5.11 MIL/uL   Hemoglobin 11.5 (*) 12.0 - 15.0 g/dL   HCT 34.5 (*) 36.0 - 46.0 %   MCV 90.1  78.0 - 100.0 fL   MCH 30.0  26.0 - 34.0 pg   MCHC 33.3  30.0 - 36.0 g/dL   RDW 14.5  11.5 - 15.5 %   Platelets 387  150 - 400 K/uL   Neutrophils Relative % 82 (*) 43 - 77 %   Neutro Abs  7.6  1.7 - 7.7 K/uL   Lymphocytes Relative 11 (*) 12 - 46 %   Lymphs Abs 1.0  0.7 - 4.0 K/uL   Monocytes  Relative 7  3 - 12 %   Monocytes Absolute 0.7  0.1 - 1.0 K/uL   Eosinophils Relative 0  0 - 5 %   Eosinophils Absolute 0.0  0.0 - 0.7 K/uL   Basophils Relative 0  0 - 1 %   Basophils Absolute 0.0  0.0 - 0.1 K/uL  COMPREHENSIVE METABOLIC PANEL     Status: Abnormal   Collection Time    07/17/14 12:12 AM      Result Value Ref Range   Sodium 137  137 - 147 mEq/L   Potassium 4.8  3.7 - 5.3 mEq/L   Chloride 99  96 - 112 mEq/L   CO2 26  19 - 32 mEq/L   Glucose, Bld 135 (*) 70 - 99 mg/dL   BUN 17  6 - 23 mg/dL   Creatinine, Ser 1.21 (*) 0.50 - 1.10 mg/dL   Calcium 9.5  8.4 - 10.5 mg/dL   Total Protein 6.2  6.0 - 8.3 g/dL   Albumin 3.2 (*) 3.5 - 5.2 g/dL   AST 20  0 - 37 U/L   ALT 14  0 - 35 U/L   Alkaline Phosphatase 92  39 - 117 U/L   Total Bilirubin 0.4  0.3 - 1.2 mg/dL   GFR calc non Af Amer 38 (*) >90 mL/min   GFR calc Af Amer 44 (*) >90 mL/min   Comment: (NOTE)     The eGFR has been calculated using the CKD EPI equation.     This calculation has not been validated in all clinical situations.     eGFR's persistently <90 mL/min signify possible Chronic Kidney     Disease.   Anion gap 12  5 - 15  URINALYSIS, ROUTINE W REFLEX MICROSCOPIC     Status: Abnormal   Collection Time    07/17/14  5:02 AM      Result Value Ref Range   Color, Urine YELLOW  YELLOW   APPearance CLEAR  CLEAR   Specific Gravity, Urine 1.026  1.005 - 1.030   pH 6.0  5.0 - 8.0   Glucose, UA NEGATIVE  NEGATIVE mg/dL   Hgb urine dipstick NEGATIVE  NEGATIVE   Bilirubin Urine SMALL (*) NEGATIVE   Ketones, ur 15 (*) NEGATIVE mg/dL   Protein, ur NEGATIVE  NEGATIVE mg/dL   Urobilinogen, UA 0.2  0.0 - 1.0 mg/dL   Nitrite NEGATIVE  NEGATIVE   Leukocytes, UA TRACE (*) NEGATIVE  URINE MICROSCOPIC-ADD ON     Status: Abnormal   Collection Time    07/17/14  5:02 AM      Result Value Ref Range   Squamous  Epithelial / LPF FEW (*) RARE   WBC, UA 3-6  <3 WBC/hpf   RBC / HPF 0-2  <3 RBC/hpf   Bacteria, UA RARE  RARE   Casts HYALINE CASTS (*) NEGATIVE  GLUCOSE, CAPILLARY     Status: Abnormal   Collection Time    07/17/14  6:45 AM      Result Value Ref Range   Glucose-Capillary 133 (*) 70 - 99 mg/dL  GLUCOSE, CAPILLARY     Status: Abnormal   Collection Time    07/17/14 11:43 AM      Result Value Ref Range   Glucose-Capillary 115 (*) 70 - 99 mg/dL  GLUCOSE, CAPILLARY     Status: Abnormal   Collection Time    07/17/14  4:40 PM      Result Value Ref Range   Glucose-Capillary 123 (*)  70 - 99 mg/dL  GLUCOSE, CAPILLARY     Status: Abnormal   Collection Time    07/17/14 10:19 PM      Result Value Ref Range   Glucose-Capillary 172 (*) 70 - 99 mg/dL  CBC     Status: Abnormal   Collection Time    07/18/14  4:37 AM      Result Value Ref Range   WBC 17.0 (*) 4.0 - 10.5 K/uL   RBC 4.43  3.87 - 5.11 MIL/uL   Hemoglobin 13.1  12.0 - 15.0 g/dL   HCT 40.0  36.0 - 46.0 %   MCV 90.3  78.0 - 100.0 fL   MCH 29.6  26.0 - 34.0 pg   MCHC 32.8  30.0 - 36.0 g/dL   RDW 14.6  11.5 - 15.5 %   Platelets 452 (*) 150 - 400 K/uL  BASIC METABOLIC PANEL     Status: Abnormal   Collection Time    07/18/14  4:37 AM      Result Value Ref Range   Sodium 139  137 - 147 mEq/L   Potassium 5.1  3.7 - 5.3 mEq/L   Chloride 102  96 - 112 mEq/L   CO2 23  19 - 32 mEq/L   Glucose, Bld 123 (*) 70 - 99 mg/dL   BUN 22  6 - 23 mg/dL   Creatinine, Ser 1.28 (*) 0.50 - 1.10 mg/dL   Calcium 9.2  8.4 - 10.5 mg/dL   GFR calc non Af Amer 35 (*) >90 mL/min   GFR calc Af Amer 41 (*) >90 mL/min   Comment: (NOTE)     The eGFR has been calculated using the CKD EPI equation.     This calculation has not been validated in all clinical situations.     eGFR's persistently <90 mL/min signify possible Chronic Kidney     Disease.   Anion gap 14  5 - 15  GLUCOSE, CAPILLARY     Status: Abnormal   Collection Time    07/18/14 11:08 AM       Result Value Ref Range   Glucose-Capillary 157 (*) 70 - 99 mg/dL   Dg Chest 2 View  07/16/2014   CLINICAL DATA:  [place] this morning.  EXAM: CHEST  2 VIEW  COMPARISON:  07/22/2005.  FINDINGS: Normal sized heart. Clear lungs. Thoracic spine degenerative changes. 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 No fracture or pneumothorax.  IMPRESSION: No acute abnormality.   Electronically Signed   By: Enrique Sack M.D.   On: 07/16/2014 23:34   Dg Pelvis 1-2 Views  07/16/2014   CLINICAL DATA:  Fall  EXAM: PELVIS - 1-2 VIEW  COMPARISON:  Prior radiograph from 02/01/2011  FINDINGS: Right total hip arthroplasty in place. The acetabular and femoral components articulate normally with 1 another. No acute fracture or dislocation. Bony pelvis is intact. SI joints are approximated. No pubic dye stasis.  Prominent degenerative changes noted within the lower lumbar spine.  No soft tissue abnormality.  Osteopenia noted. Vascular calcifications present within the upper thighs.  IMPRESSION: 1. No acute fracture or dislocation. 2. Right hip arthroplasty in place without evidence of hardware complication.   Electronically Signed   By: Jeannine Boga M.D.   On: 07/16/2014 23:34   Dg Femur Left  07/16/2014   CLINICAL DATA:  [place] at 6 a.m. this morning.  Left lower leg pain.  EXAM: LEFT FEMUR - 2 VIEW  COMPARISON:  None.  FINDINGS: There is no evidence of fracture or other focal bone lesions. Soft tissues are unremarkable. Prominent degenerative changes in the left knee. Vascular calcifications.  IMPRESSION: No acute bony abnormalities.   Electronically Signed   By: Lucienne Capers M.D.   On: 07/16/2014 23:31   Dg Tibia/fibula Left  07/16/2014   CLINICAL DATA:  Left lower leg pain after a fall at 6 a.m. this morning.  EXAM: LEFT TIBIA AND FIBULA - 2 VIEW  COMPARISON:  12/31/2013  FINDINGS: Acute mostly transverse fracture of the proximal/ mid shaft of the left tibia without  significant displacement. Marked degenerative changes in the left knee. Left fibula appears intact.  IMPRESSION: Fracture of the proximal/ mid shaft of the left tibia without significant displacement.   Electronically Signed   By: Lucienne Capers M.D.   On: 07/16/2014 23:32   Ct Angio Chest Pe W/cm &/or Wo Cm  07/18/2014   CLINICAL DATA:  Recent left lower extremity surgery. Tachycardia. Immobilization. Evaluate for pulmonary embolism.  EXAM: CT ANGIOGRAPHY CHEST WITH CONTRAST  TECHNIQUE: Multidetector CT imaging of the chest was performed using the standard protocol during bolus administration of intravenous contrast. Multiplanar CT image reconstructions and MIPs were obtained to evaluate the vascular anatomy.  CONTRAST:  81m OMNIPAQUE IOHEXOL 350 MG/ML SOLN  COMPARISON:  Chest radiograph 07/18/2014 ; chest CT 07/22/2005  FINDINGS: Motion artifact limits evaluation for the assessment of pulmonary embolism. No evidence for central, main, lobar or segmental pulmonary embolism. Evaluation of the distal segmental and subsegmental pulmonary arteries limited due to motion artifact.  No enlarged axillary, mediastinal or hilar lymphadenopathy. Sub cm right peritracheal lymph node, stable dating back to 2006 heart is enlarged. No pericardial effusion. Coronary arterial calcifications. Aorta and main pulmonary artery are normal in caliber.  Central airways are patent. Subpleural ground-glass and consolidative opacities within the bilateral lower lobes. Additionally there are scattered patchy ground-glass opacities. There is a 1.5 cm focal area ground-glass within the right upper lobe (image 71; series 47). 5 mm subpleural ground-glass opacity within the left lower lobe (image 41; series 406). Probable trace bilateral pleural effusions.  Limited visualization of the upper abdomen demonstrates free intraperitoneal air. Atrophy of the pancreas. Small hiatal hernia.  Multilevel degenerative change of the thoracic spine.   Review of the MIP images confirms the above findings.  IMPRESSION: 1. Free intraperitoneal air is demonstrated within the upper abdomen. Recommend correlation with abdominal pelvic CT as this is concerning for perforation. 2. Evaluation for pulmonary embolism is limited secondary to motion artifact. No evidence for central, main, lobar or proximal segmental pulmonary emboli. Evaluation of the distal segmental and subsegmental pulmonary arteries are limited due to motion artifact. 3. Bilateral lower lobe ground-glass and subpleural consolidative opacities favored represents atelectasis. Additionally scattered ground-glass pulmonary opacities 4. More focal 1.5 cm area of ground-glass attenuation within the right upper lobe. Follow-up chest CT in 3 months is recommended to assess for persistence. Critical Value/emergent results were called by telephone at the time of interpretation on 07/18/2014 at 11:27 am to Dr. HDoran Durand who verbally acknowledged these results.   Electronically Signed   By: DLovey NewcomerM.D.   On: 07/18/2014 11:27   Dg Chest Port 1 View  07/18/2014   CLINICAL DATA:  Fever.  Abnormal labs.  EXAM: PORTABLE CHEST - 1 VIEW  COMPARISON:  07/16/2014  FINDINGS: Shallow inspiration with atelectasis in the lung bases. Heart size and pulmonary vascularity appear normal. No focal consolidation in the lungs. No blunting of  costophrenic angles. No pneumothorax. Calcification of the aorta. Degenerative changes in the spine and shoulders.  IMPRESSION: Shallow inspiration with atelectasis in the lung bases appear   Electronically Signed   By: Lucienne Capers M.D.   On: 07/18/2014 06:45   Dg Abd Portable 1v  07/17/2014   CLINICAL DATA:  Abdominal pain and vomiting.  EXAM: PORTABLE ABDOMEN - 1 VIEW  COMPARISON:  02/01/2011 and pelvis radiograph dated 07/16/2014.  FINDINGS: Normal bowel gas pattern. Stable 2.2 cm oval calcification in the region of the lower pole of the right kidney. Stable right hip prosthesis.  Thoracolumbar spine degenerative changes and scoliosis.  IMPRESSION: No acute abnormality.   Electronically Signed   By: Enrique Sack M.D.   On: 07/17/2014 23:04       Assessment/Plan 1. Pneumoperitoneum, unknown etiology 2. AMS 3. Left tibia fracture 4. DM 5. HTN  Plan: 1. We are currently waiting on the daughter to arrive so we can discuss all of these findings with her.  If she would like to proceed with surgical intervention, we will proceed this afternoon with laparotomy, possible bowel resection, possible ostomy.  If the daughter does not want to pursue an operation then we will continue the patient on zosyn and try to treat conservatively.  This will all depend on how aggressive the daughter would like to be with her mom's care.  Given the patient's age and current state, she is at higher risk for complications and other problems.  We will decide on surgery after speaking with the daughter.  Addylynn Balin E 07/18/2014, 12:13 PM Pager: [phone]

## 2014-07-18 NOTE — Progress Notes (Addendum)
Subjective: Kelli Pena is difficult to arouse this morning. Is arousable and A and O to person and time, but is not oriented to place or reason for being in the hospital. Per overnight RN, she had episodes of bilious emesis last night and decreased mental status. Rapid response as initiated, but change in mentation was thought to be due to narcotic medications. PA on-call for Dr. Victorino Dike d/c oxy and changed her to Tylenol and tramadol.  Stat KUB without significant findings. CBC demonstrated increased WBC from 9 (8/27) to 17 (8/28). CXR had no signs of PNA or infection, just mild atelectasis in the lung bases. RN states that she was not complaining of any abdominal pain or other symptoms overnight. Her nausea and vomiting d/c with phenergan, last dose at 11pm 8/27.   Objective: Vital signs in last 24 hours: Temp:  [98.3 F (36.8 C)-100.8 F (38.2 C)] 100.8 F (38.2 C) (08/28 0430) Pulse Rate:  [88-107] 107 (08/28 0430) Resp:  [16] 16 (08/28 0430) BP: (93-142)/(56-78) 93/65 mmHg (08/28 0430) SpO2:  [95 %-98 %] 95 % (08/28 0430)  Intake/Output from previous day: 08/27 0701 - 08/28 0700 In: 720 [P.O.:720] Out: -  Intake/Output this shift:     Recent Labs  07/17/14 0012 07/18/14 0437  HGB 11.5* 13.1    Recent Labs  07/17/14 0012 07/18/14 0437  WBC 9.4 17.0*  RBC 3.83* 4.43  HCT 34.5* 40.0  PLT 387 452*    Recent Labs  07/17/14 0012 07/18/14 0437  NA 137 139  K 4.8 5.1  CL 99 102  CO2 26 23  BUN 17 22  CREATININE 1.21* 1.28*  GLUCOSE 135* 123*  CALCIUM 9.5 9.2   No results found for this basename: LABPT, INR,  in the last 72 hours  WD/WN elderly female, laying in bed. Opens eyes to painful stimulation only. A and O to person and time, not to place and recent events. Mood and affect unable to assess. Pt. lethargic. She follows commands briefly. GCS 12 with disorientation and arousal to pain only. LLE in long-leg cast. NV intact with brisk capillary refill and  appropriate motor function.No abdominal pain or masses noted. (+) BS in all 4 quadrants. Heart sounds tachycardic with normal rhythm, normal S1 and S2, no murmurs. Chest CTAB.  Assessment/Plan: -L tibia fracture: in long-leg cast. Will continue with NWB on LLE. Up with PT when mental status improves. Continue with Tylenol and tramadol for pain only  -Altered mental status -Leukocytosis: Consult to hospitalist. Awaiting UA/cultures. No blood cultures to date -Tachycardia: given her age, recent fracture and immobilization, will place an order for stat CTA to r/o PE as a possible explanation for her change in mental status and VS. PERC (+) for age >50, HR >100, and recent trauma/immobilization.   Gerard Bonus HOWELLS 07/18/2014, 7:28 AM    -Spoke with Hospitalist about Kelli Pena around 7:45. She is to see her today and work her up for any source of infection.  -Discussed pt with Dr. Victorino Dike. He advised for a consult to the hospitalist service and a stat CTA.

## 2014-07-18 NOTE — Progress Notes (Signed)
OT Cancellation Note  Patient Details Name: Kelli Pena MRN: [id number] DOB: 29-Oct-1920   Cancelled Treatment:    Reason Eval/Treat Not Completed: Other (comment) Pt is Medicare and current D/C plan is SNF. No apparent immediate acute care OT needs, therefore will defer OT to SNF. If OT eval is needed please call Acute Rehab Dept. at [phone] or text page OT at [phone].    Evette Georges [phone] 07/18/2014, 11:52 AM

## 2014-07-18 NOTE — Clinical Social Work Placement (Signed)
Clinical Social Work Department CLINICAL SOCIAL WORK PLACEMENT NOTE 07/18/2014  Patient:  Kelli Pena, Kelli Pena  Account Number:  [id number] Admit date:  07/16/2014  Clinical Social Worker:  Merlyn Lot, CLINICAL SOCIAL WORKER  Date/time:  07/18/2014 10:38 AM  Clinical Social Work is seeking post-discharge placement for this patient at the following level of care:   SKILLED NURSING   (*CSW will update this form in Epic as items are completed)   07/18/2014  Patient/family provided with [facility] Department of Clinical Social Work's list of facilities offering this level of care within the geographic area requested by the patient (or if unable, by the patient's family).  07/18/2014  Patient/family informed of their freedom to choose among providers that offer the needed level of care, that participate in Medicare, Medicaid or managed care program needed by the patient, have an available bed and are willing to accept the patient.  07/18/2014  Patient/family informed of [facility]' ownership interest in [facility], as well as of the fact that they are under no obligation to receive care at this facility.  PASARR submitted to EDS on 07/18/2014 PASARR number received on 07/18/2014  FL2 transmitted to all facilities in geographic area requested by pt/family on  07/18/2014 FL2 transmitted to all facilities within larger geographic area on   Patient informed that his/her managed care company has contracts with or will negotiate with  certain facilities, including the following:     Patient/family informed of bed offers received:   Patient chooses bed at  Physician recommends and patient chooses bed at    Patient to be transferred to  on   Patient to be transferred to facility by  Patient and family notified of transfer on  Name of family member notified:    The following physician request were entered in Epic:   Additional Comments:   Merlyn Lot, [facility] Clinical Social  Worker [phone]

## 2014-07-18 NOTE — Consult Note (Signed)
Agree with note above.  NWB in [facility].  We'll put the cast on later Thursday.

## 2014-07-18 NOTE — Consult Note (Addendum)
Medical Consultation   Kelli Pena  ZOX:[id number]  DOB: 1920/01/05  [name]: 07/16/2014  PCP: [name], MD  Requesting physician: Ms. Christiana Fuchs, PA  Reason for consultation: Altered mental status   History of Present Illness: 80 year old female with past medical history of hypertension, diabetes mellitus, arthritis, who presented after falling and experiencing a left tibial fracture. Patient was seen by orthopedics, and placed in a long-leg cast. She was admitted for observation. TRH was called tilted for altered mental status. Patient currently does not recall why she is in the hospital and is not oriented to place or time. At this time she is arousable however very lethargic. Patient cannot answer all questions and can only follow some commands.  Allergies:   Allergies  Allergen Reactions  . Tetanus Toxoids Swelling      Past Medical History  Diagnosis Date  . Hypertension   . Diabetes mellitus without complication   . Arthritis   . Hypothyroid   . Fibromyalgia   . Complication of anesthesia     trouble waking up, very cold, hypotension    Past Surgical History  Procedure Laterality Date  . Hip surgery    . Cholecystectomy    . Eye surgery      Social History:  reports that she quit smoking about 48 years ago. Her smoking use included Cigarettes. She has a 30 pack-year smoking history. She has never used smokeless tobacco. She reports that she drinks alcohol. She reports that she does not use illicit drugs.  Family History  Problem Relation Age of Onset  . Stroke Father   . Arthritis Sister   . Arthritis Daughter     Review of Systems:  Unable to obtain due to patient's current state  Physical Exam: Blood pressure 93/65, pulse 107, temperature 100.8 F (38.2 C), temperature source Oral, resp. rate 16, height  (1.499 m), weight 67.586 kg (149 lb), SpO2 95.00%.   General: Well developed, well nourished, NAD, appears stated age  HEENT:  NCAT, PERRLA, EOMI, Anicteic Sclera, mucous membranes moist.   Neck: Supple, no JVD, no masses  Cardiovascular: S1 S2 auscultated, no rubs, murmurs or gallops. Regular rate and rhythm.  Respiratory: Clear to auscultation bilaterally with equal chest rise  Abdomen: Soft, nontender, nondistended, + bowel sounds  Extremities: warm dry without cyanosis clubbing or edema, left lower extremity in long leg cast  Neuro: Awake and arousable, oriented to self, not place. Follows the commands, patient very lethargic at this time.  Skin: Without rashes exudates or nodules  Psych: Unable to fully assess at this time.  Labs on Admission:  Basic Metabolic Panel:  Recent Labs Lab 07/17/14 0012 07/18/14 0437  NA 137 139  K 4.8 5.1  CL 99 102  CO2 26 23  GLUCOSE 135* 123*  BUN 17 22  CREATININE 1.21* 1.28*  CALCIUM 9.5 9.2   Liver Function Tests:  Recent Labs Lab 07/17/14 0012  AST 20  ALT 14  ALKPHOS 92  BILITOT 0.4  PROT 6.2  ALBUMIN 3.2*   No results found for this basename: LIPASE, AMYLASE,  in the last 168 hours No results found for this basename: AMMONIA,  in the last 168 hours CBC:  Recent Labs Lab 07/17/14 0012 07/18/14 0437  WBC 9.4 17.0*  NEUTROABS 7.6  --   HGB 11.5* 13.1  HCT 34.5* 40.0  MCV 90.1 90.3  PLT 387 452*   Cardiac Enzymes: No results found for this basename: CKTOTAL, CKMB, CKMBINDEX,  TROPONINI,  in the last 168 hours BNP: No components found with this basename: POCBNP,  CBG:  Recent Labs Lab 07/17/14 0645 07/17/14 1143 07/17/14 1640 07/17/14 2219 07/18/14 1108  GLUCAP 133* 115* 123* 172* 157*    Inpatient Medications:   Scheduled Meds: . aspirin  325 mg Oral Q breakfast  . DULoxetine  60 mg Oral QHS  . enoxaparin (LOVENOX) injection  30 mg Subcutaneous Q24H  . furosemide  40 mg Oral Daily  . gabapentin  600 mg Oral QHS  . irbesartan  150 mg Oral Daily  . levothyroxine  75 mcg Oral QAC breakfast  . pantoprazole (PROTONIX) IV   40 mg Intravenous Q12H  . piroxicam  20 mg Oral Daily  . pneumococcal 23 valent vaccine  0.5 mL Intramuscular Tomorrow-1000  . potassium chloride  10 mEq Oral BID  . rOPINIRole  4 mg Oral QHS  . sulfamethoxazole-trimethoprim  1 tablet Oral Daily  . [START ON 07/20/2014] Vitamin D (Ergocalciferol)  50,000 Units Oral Q7 days   Continuous Infusions:    Radiological Exams on Admission: Dg Chest 2 View  07/16/2014   CLINICAL DATA:  Larey Seat this morning.  EXAM: CHEST  2 VIEW  COMPARISON:  07/22/2005.  FINDINGS: Normal sized heart. Clear lungs. Thoracic spine degenerative changes. 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 No fracture or pneumothorax.  IMPRESSION: No acute abnormality.   Electronically Signed   By: Gordan Payment M.D.   On: 07/16/2014 23:34   Dg Pelvis 1-2 Views  07/16/2014   CLINICAL DATA:  Fall  EXAM: PELVIS - 1-2 VIEW  COMPARISON:  Prior radiograph from 02/01/2011  FINDINGS: Right total hip arthroplasty in place. The acetabular and femoral components articulate normally with 1 another. No acute fracture or dislocation. Bony pelvis is intact. SI joints are approximated. No pubic dye stasis.  Prominent degenerative changes noted within the lower lumbar spine.  No soft tissue abnormality.  Osteopenia noted. Vascular calcifications present within the upper thighs.  IMPRESSION: 1. No acute fracture or dislocation. 2. Right hip arthroplasty in place without evidence of hardware complication.   Electronically Signed   By: Rise Mu M.D.   On: 07/16/2014 23:34   Dg Femur Left  07/16/2014   CLINICAL DATA:  Larey Seat at 6 a.m. this morning.  Left lower leg pain.  EXAM: LEFT FEMUR - 2 VIEW  COMPARISON:  None.  FINDINGS: There is no evidence of fracture or other focal bone lesions. Soft tissues are unremarkable. Prominent degenerative changes in the left knee. Vascular calcifications.  IMPRESSION: No acute bony abnormalities.   Electronically Signed   By:  Burman Nieves M.D.   On: 07/16/2014 23:31   Dg Tibia/fibula Left  07/16/2014   CLINICAL DATA:  Left lower leg pain after a fall at 6 a.m. this morning.  EXAM: LEFT TIBIA AND FIBULA - 2 VIEW  COMPARISON:  12/31/2013  FINDINGS: Acute mostly transverse fracture of the proximal/ mid shaft of the left tibia without significant displacement. Marked degenerative changes in the left knee. Left fibula appears intact.  IMPRESSION: Fracture of the proximal/ mid shaft of the left tibia without significant displacement.   Electronically Signed   By: Burman Nieves M.D.   On: 07/16/2014 23:32   Ct Angio Chest Pe W/cm &/or Wo Cm  07/18/2014   CLINICAL DATA:  Recent left lower extremity surgery. Tachycardia. Immobilization. Evaluate for pulmonary embolism.  EXAM: CT ANGIOGRAPHY CHEST WITH CONTRAST  TECHNIQUE: Multidetector CT imaging of the  chest was performed using the standard protocol during bolus administration of intravenous contrast. Multiplanar CT image reconstructions and MIPs were obtained to evaluate the vascular anatomy.  CONTRAST:  70mL OMNIPAQUE IOHEXOL 350 MG/ML SOLN  COMPARISON:  Chest radiograph 07/18/2014 ; chest CT 07/22/2005  FINDINGS: Motion artifact limits evaluation for the assessment of pulmonary embolism. No evidence for central, main, lobar or segmental pulmonary embolism. Evaluation of the distal segmental and subsegmental pulmonary arteries limited due to motion artifact.  No enlarged axillary, mediastinal or hilar lymphadenopathy. Sub cm right peritracheal lymph node, stable dating back to 2006 heart is enlarged. No pericardial effusion. Coronary arterial calcifications. Aorta and main pulmonary artery are normal in caliber.  Central airways are patent. Subpleural ground-glass and consolidative opacities within the bilateral lower lobes. Additionally there are scattered patchy ground-glass opacities. There is a 1.5 cm focal area ground-glass within the right upper lobe (image 71; series 47). 5  mm subpleural ground-glass opacity within the left lower lobe (image 41; series 406). Probable trace bilateral pleural effusions.  Limited visualization of the upper abdomen demonstrates free intraperitoneal air. Atrophy of the pancreas. Small hiatal hernia.  Multilevel degenerative change of the thoracic spine.  Review of the MIP images confirms the above findings.  IMPRESSION: 1. Free intraperitoneal air is demonstrated within the upper abdomen. Recommend correlation with abdominal pelvic CT as this is concerning for perforation. 2. Evaluation for pulmonary embolism is limited secondary to motion artifact. No evidence for central, main, lobar or proximal segmental pulmonary emboli. Evaluation of the distal segmental and subsegmental pulmonary arteries are limited due to motion artifact. 3. Bilateral lower lobe ground-glass and subpleural consolidative opacities favored represents atelectasis. Additionally scattered ground-glass pulmonary opacities 4. More focal 1.5 cm area of ground-glass attenuation within the right upper lobe. Follow-up chest CT in 3 months is recommended to assess for persistence. Critical Value/emergent results were called by telephone at the time of interpretation on 07/18/2014 at 11:27 am to Dr. Victorino Dike, who verbally acknowledged these results.   Electronically Signed   By: Annia Belt M.D.   On: 07/18/2014 11:27   Dg Chest Port 1 View  07/18/2014   CLINICAL DATA:  Fever.  Abnormal labs.  EXAM: PORTABLE CHEST - 1 VIEW  COMPARISON:  07/16/2014  FINDINGS: Shallow inspiration with atelectasis in the lung bases. Heart size and pulmonary vascularity appear normal. No focal consolidation in the lungs. No blunting of costophrenic angles. No pneumothorax. Calcification of the aorta. Degenerative changes in the spine and shoulders.  IMPRESSION: Shallow inspiration with atelectasis in the lung bases appear   Electronically Signed   By: Burman Nieves M.D.   On: 07/18/2014 06:45   Dg Abd Portable  1v  07/17/2014   CLINICAL DATA:  Abdominal pain and vomiting.  EXAM: PORTABLE ABDOMEN - 1 VIEW  COMPARISON:  02/01/2011 and pelvis radiograph dated 07/16/2014.  FINDINGS: Normal bowel gas pattern. Stable 2.2 cm oval calcification in the region of the lower pole of the right kidney. Stable right hip prosthesis. Thoracolumbar spine degenerative changes and scoliosis.  IMPRESSION: No acute abnormality.   Electronically Signed   By: Gordan Payment M.D.   On: 07/17/2014 23:04    Impression/Recommendations Active Problems:   Tibia fracture  SIRS secondary to unknown the source -Patient leukocytosis and is currently febrile and tachycardic. -Chest x-ray shows atelectasis in the lung bases, no infiltration -Abdominal x-ray shows no acute abnormality -UA from 07/17/2014 negative for infection, pending repeat UA today -Will obtain blood cultures -Will place patient  on broad-spectrum antibiotics -Will continue to monitor CBC  Altered mental status -Secondary to unknown cause at this time, possibly secondary to narcotic -Will obtain ammonia level, TSH, B12, folate levels -Will continue to monitor  Left tibia fracture -Followed by orthopedics, currently in cast  I will followup again in the morning. Please contact me if I can be of assistance in the meanwhile. Thank you for this consultation.  Time Spent: 60 minutes  Alfonza Toft D.O. [facility] 07/18/2014, 12:26 PM   Addendum:  CT Chest showed free intraperitoneal air.  General surgery was consulted and is waiting for the patient's daughter to arrive to discuss these findings with her.

## 2014-07-18 NOTE — Progress Notes (Signed)
OT Cancellation Note  Patient Details Name: CABELLA KIMM MRN: [id number] DOB: 29-Apr-1920   Cancelled Treatment:    Reason Eval/Treat Not Completed: Patient at procedure or test/ unavailable. In radiology to r/o PE.  Evette Georges [phone] 07/18/2014, 10:58 AM

## 2014-07-18 NOTE — Progress Notes (Signed)
Patient's daughter [name] stated that her mother's code status is a full code.

## 2014-07-19 DIAGNOSIS — G2581 Restless legs syndrome: Secondary | ICD-10-CM

## 2014-07-19 DIAGNOSIS — E039 Hypothyroidism, unspecified: Secondary | ICD-10-CM

## 2014-07-19 DIAGNOSIS — G589 Mononeuropathy, unspecified: Secondary | ICD-10-CM

## 2014-07-19 DIAGNOSIS — E119 Type 2 diabetes mellitus without complications: Secondary | ICD-10-CM

## 2014-07-19 DIAGNOSIS — F329 Major depressive disorder, single episode, unspecified: Secondary | ICD-10-CM

## 2014-07-19 DIAGNOSIS — E038 Other specified hypothyroidism: Secondary | ICD-10-CM

## 2014-07-19 DIAGNOSIS — F3289 Other specified depressive episodes: Secondary | ICD-10-CM

## 2014-07-19 DIAGNOSIS — K668 Other specified disorders of peritoneum: Secondary | ICD-10-CM

## 2014-07-19 LAB — COMPREHENSIVE METABOLIC PANEL
ALT: 13 U/L (ref 0–35)
ANION GAP: 18 — AB (ref 5–15)
AST: 17 U/L (ref 0–37)
Albumin: 2.7 g/dL — ABNORMAL LOW (ref 3.5–5.2)
Alkaline Phosphatase: 84 U/L (ref 39–117)
BILIRUBIN TOTAL: 0.6 mg/dL (ref 0.3–1.2)
BUN: 29 mg/dL — ABNORMAL HIGH (ref 6–23)
CALCIUM: 9.1 mg/dL (ref 8.4–10.5)
CHLORIDE: 103 meq/L (ref 96–112)
CO2: 20 meq/L (ref 19–32)
CREATININE: 1.38 mg/dL — AB (ref 0.50–1.10)
GFR calc Af Amer: 37 mL/min — ABNORMAL LOW (ref >90)
GFR, EST NON AFRICAN AMERICAN: 32 mL/min — AB (ref >90)
Glucose, Bld: 121 mg/dL — ABNORMAL HIGH (ref 70–99)
Potassium: 4.6 mEq/L (ref 3.7–5.3)
Sodium: 141 mEq/L (ref 137–147)
Total Protein: 6.3 g/dL (ref 6.0–8.3)

## 2014-07-19 LAB — CBC
HCT: 39.6 % (ref 36.0–46.0)
Hemoglobin: 13.1 g/dL (ref 12.0–15.0)
MCH: 29.6 pg (ref 26.0–34.0)
MCHC: 33.1 g/dL (ref 30.0–36.0)
MCV: 89.6 fL (ref 78.0–100.0)
Platelets: 484 10*3/uL — ABNORMAL HIGH (ref 150–400)
RBC: 4.42 MIL/uL (ref 3.87–5.11)
RDW: 14.7 % (ref 11.5–15.5)
WBC: 15.2 10*3/uL — ABNORMAL HIGH (ref 4.0–10.5)

## 2014-07-19 LAB — GLUCOSE, CAPILLARY: GLUCOSE-CAPILLARY: 119 mg/dL — AB (ref 70–99)

## 2014-07-19 LAB — FOLATE RBC: RBC Folate: 970 ng/mL — ABNORMAL HIGH (ref >280)

## 2014-07-19 NOTE — Progress Notes (Signed)
Subjective: L tibial stress fx being tx'd non-operatively, s/p long leg cast application 8/27 by Dr. Rowland Lathe pt with altered MS, tachycardia, fever, CTA positive for free air in the abdomen, neg for PE. General surgery consulted, felt free air likely from perforated colon, started on zosyn yesterday, discussed options with daughter and they mutually decided against surgery. She has been transferred to the medical service given the events yesterday Per daughter fever broke overnight and she is much improved today. Her MS has returned to normal. She is still having some discomfort with deep inspirations but otherwise is comfortable.  She is asking if she can eat this AM.  Objective: Vital signs in last 24 hours: Temp:  [98.2 F (36.8 C)-99.8 F (37.7 C)] 98.2 F (36.8 C) (08/29 0541) Pulse Rate:  [96-129] 129 (08/29 0541) Resp:  [16] 16 (08/29 0541) BP: (100-120)/(48-75) 100/48 mmHg (08/29 0541) SpO2:  [98 %-99 %] 98 % (08/29 0541)  Intake/Output from previous day:   Intake/Output this shift:     Recent Labs  07/17/14 0012 07/18/14 0437  HGB 11.5* 13.1    Recent Labs  07/17/14 0012 07/18/14 0437  WBC 9.4 17.0*  RBC 3.83* 4.43  HCT 34.5* 40.0  PLT 387 452*    Recent Labs  07/17/14 0012 07/18/14 0437  NA 137 139  K 4.8 5.1  CL 99 102  CO2 26 23  BUN 17 22  CREATININE 1.21* 1.28*  GLUCOSE 135* 123*  CALCIUM 9.5 9.2    Recent Labs  07/18/14 1620  INR 1.11    Neurologically intact Neurovascular intact Sensation intact distally Intact pulses distally Dorsiflexion/Plantar flexion intact No cellulitis present Compartment soft left leg long leg cast intact, no skin maceration or breakdown. able to flex/extend does. sensation intact.  Assessment/Plan: L tibial stress fx- non-operative tx, currently in long leg cast Recommend continued ice, elevation L leg to reduce swelling NWB LLE Free peritoneal air- likely perforated colon, tx nonoperatively  with zosyn, fever improved, pending WBC this AM, MS returned to normal, still tachycardic Pending AM labwork plan to resume normal diet and continue her lovenox for DVT ppx Plan eventual SNF placement when medically stable per surgery and medicine  BISSELL, JACLYN M. 07/19/2014, 8:17 AM

## 2014-07-19 NOTE — Progress Notes (Signed)
PT Cancellation Note  Patient Details Name: Kelli Pena MRN: [id number] DOB: [birth year]   Cancelled Treatment:    Reason Eval/Treat Not Completed: Medical issues which prohibited therapy  Pt with suspected perforated colon.  Pt is currently NPO and daughter is requesting bedrest to allow for further healing.  PT to re-assess Monday.   Ilda Foil 07/19/2014, 1:47 PM  Aida Raider, PT  Office # [phone] Pager [phone]

## 2014-07-19 NOTE — Progress Notes (Addendum)
[facility]                                                                              Patient Demographics  Kelli Pena, is a 80 y.o. female, DOB - 1920-06-07, ZOX:[id number]  Admit date - 07/16/2014   Admitting Physician Toni Arthurs, MD  Outpatient Primary MD for the patient is Kelli Jubilee, MD  LOS - 3   Chief Complaint  Patient presents with  . Fall  . Leg Pain      HPI/Consult  80 year old female with past medical history of hypertension, diabetes mellitus, arthritis, who presented after falling and experiencing a left tibial fracture. Patient was seen by orthopedics, and placed in a long-leg cast. She was admitted for observation. TRH was called tilted for altered mental status. Patient currently does not recall why she is in the hospital and is not oriented to place or time. At the time of evaluation, she was arousable however very lethargic. Patient could not answer all questions and could only follow some commands.   Assessment & Plan  SIRS secondary to unknown the source  -Leukocytosis improving, patient afebrile -Chest x-ray shows atelectasis in the lung bases, no infiltration  -Abdominal x-ray shows no acute abnormality  -UA from 07/17/2014 and 8/29 negative for infection -Pending blood culture -Possibly secondary to perforated colon  Pneumoperitoneum -Unknown etiology -CTA of the chest showed free intraperitoneal air -General surgery consulted and appreciated -Patient's daughter wished to try conservative management with antibiotics and pain medications as needed -Continue Zosyn and IVF, NPO status  Altered mental status  -Resolved -Secondary to unknown cause at this time, possibly secondary to narcotics vs pneumoperitoneum  -Vitamin B12 317, ammonia 19, TSH 1.2 (all within normal limits)  Left tibia fracture  -Followed by orthopedics, currently in cast   Hypothyroidism -Continue Synthroid 37.5 mcg IV daily   Restless leg syndrome -Requip  held due to n.p.o. status  Depression -Cymbalta held due to to n.p.o. status  Hypertension -Losartan held due to n.p.o. Status -Continue Lasix, 20 mg IV daily -Will add on medications if needed  History of Diabetes mellitus, type II -Currently on no home medications, possibly diet controlled -Will add on medications if needed  Possibly Acute kidney injury, on chronic kidney disease -2014, patient's creatinine on 0.6 however in May of 2015 and creatinine is 1.4 -Currently 1.38 -Unsure whether this is her new baseline -Will continue to monitor  Code Status: Full  Family Communication: Daughter at bedside  Disposition Plan: Admitted, pending further recommendations from surgery  Time Spent in minutes   30 minutes  Procedures  None  Consults   General surgery Orthopedic surgery  DVT Prophylaxis  Lovenox  Lab Results  Component Value Date   PLT 484* 07/19/2014    Medications  Scheduled Meds: . aspirin  300 mg Rectal Daily  . enoxaparin (LOVENOX) injection  30 mg Subcutaneous Q24H  . furosemide  20 mg Intravenous Daily  . levothyroxine  37.5 mcg Intravenous Daily  . pantoprazole (PROTONIX) IV  40 mg Intravenous Q12H  . piperacillin-tazobactam (ZOSYN)  IV  3.375 g Intravenous 3 times per day  . pneumococcal 23 valent vaccine  0.5 mL Intramuscular Tomorrow-1000  Continuous Infusions: . sodium chloride 75 mL/hr at 07/18/14 1529   PRN Meds:.acetaminophen, fentaNYL, promethazine  Antibiotics    Anti-infectives   Start     Dose/Rate Route Frequency Ordered Stop   07/18/14 1400  piperacillin-tazobactam (ZOSYN) IVPB 3.375 g     3.375 g 12.5 mL/hr over 240 Minutes Intravenous 3 times per day 07/18/14 1228     07/17/14 1000  sulfamethoxazole-trimethoprim (BACTRIM DS) 800-160 MG per tablet 1 tablet  Status:  Discontinued     1 tablet Oral Daily 07/17/14 0819 07/18/14 1228        Subjective:   [name] seen and examined today.  Patient has no complaints  this morning. States she feels well. She does not number of events that occurred yesterday. Patient currently denies any abdominal pain, nausea, vomiting, chest pain, shortness of breath.   Objective:   Filed Vitals:   07/18/14 0430 07/18/14 1439 07/18/14 2145 07/19/14 0541  BP: 93/65 120/75 108/53 100/48  Pulse: 107 96 100 129  Temp: 100.8 F (38.2 C) 99.8 F (37.7 C) 98.6 F (37 C) 98.2 F (36.8 C)  TempSrc:  Oral Oral Oral  Resp: Height:      Weight:      SpO2: 95% 98% 99% 98%    Wt Readings from Last 3 Encounters:  07/16/14 67.586 kg (149 lb)  07/17/13 67.586 kg (149 lb)  03/21/13 68.312 kg (150 lb 9.6 oz)    No intake or output data in the 24 hours ending 07/19/14 0952  Exam General: Well developed, well nourished, NAD, appears stated age  HEENT: NCAT, mucous membranes moist.  Cardiovascular: S1 S2 auscultated, no rubs, murmurs or gallops. Regular rate and rhythm.  Respiratory: Clear to auscultation bilaterally with equal chest rise  Abdomen: Soft, nontender, nondistended, + bowel sounds  Extremities: warm dry without cyanosis clubbing or edema, left lower extremity in long leg cast  Neuro: AAO x3, no focal deficits Skin: Without rashes exudates or nodules  Psych: Appropriate mood and affect  Data Review   Micro Results No results found for this or any previous visit (from the past 240 hour(s)).  Radiology Reports Dg Chest 2 View  07/16/2014   CLINICAL DATA:  Larey Seat this morning.  EXAM: CHEST  2 VIEW  COMPARISON:  07/22/2005.  FINDINGS: Normal sized heart. Clear lungs. Thoracic spine degenerative changes. 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 No fracture or pneumothorax.  IMPRESSION: No acute abnormality.   Electronically Signed   By: Gordan Payment M.D.   On: 07/16/2014 23:34   Dg Pelvis 1-2 Views  07/16/2014   CLINICAL DATA:  Fall  EXAM: PELVIS - 1-2 VIEW  COMPARISON:  Prior radiograph from 02/01/2011   FINDINGS: Right total hip arthroplasty in place. The acetabular and femoral components articulate normally with 1 another. No acute fracture or dislocation. Bony pelvis is intact. SI joints are approximated. No pubic dye stasis.  Prominent degenerative changes noted within the lower lumbar spine.  No soft tissue abnormality.  Osteopenia noted. Vascular calcifications present within the upper thighs.  IMPRESSION: 1. No acute fracture or dislocation. 2. Right hip arthroplasty in place without evidence of hardware complication.   Electronically Signed   By: Rise Mu M.D.   On: 07/16/2014 23:34   Dg Femur Left  07/16/2014   CLINICAL DATA:  Larey Seat at 6 a.m. this morning.  Left lower leg pain.  EXAM: LEFT FEMUR - 2 VIEW  COMPARISON:  None.  FINDINGS: There is no evidence of fracture or other focal bone lesions. Soft tissues are unremarkable. Prominent degenerative changes in the left knee. Vascular calcifications.  IMPRESSION: No acute bony abnormalities.   Electronically Signed   By: Burman Nieves M.D.   On: 07/16/2014 23:31   Dg Tibia/fibula Left  07/16/2014   CLINICAL DATA:  Left lower leg pain after a fall at 6 a.m. this morning.  EXAM: LEFT TIBIA AND FIBULA - 2 VIEW  COMPARISON:  12/31/2013  FINDINGS: Acute mostly transverse fracture of the proximal/ mid shaft of the left tibia without significant displacement. Marked degenerative changes in the left knee. Left fibula appears intact.  IMPRESSION: Fracture of the proximal/ mid shaft of the left tibia without significant displacement.   Electronically Signed   By: Burman Nieves M.D.   On: 07/16/2014 23:32   Ct Angio Chest Pe W/cm &/or Wo Cm  07/18/2014   CLINICAL DATA:  Recent left lower extremity surgery. Tachycardia. Immobilization. Evaluate for pulmonary embolism.  EXAM: CT ANGIOGRAPHY CHEST WITH CONTRAST  TECHNIQUE: Multidetector CT imaging of the chest was performed using the standard protocol during bolus administration of intravenous  contrast. Multiplanar CT image reconstructions and MIPs were obtained to evaluate the vascular anatomy.  CONTRAST:  70mL OMNIPAQUE IOHEXOL 350 MG/ML SOLN  COMPARISON:  Chest radiograph 07/18/2014 ; chest CT 07/22/2005  FINDINGS: Motion artifact limits evaluation for the assessment of pulmonary embolism. No evidence for central, main, lobar or segmental pulmonary embolism. Evaluation of the distal segmental and subsegmental pulmonary arteries limited due to motion artifact.  No enlarged axillary, mediastinal or hilar lymphadenopathy. Sub cm right peritracheal lymph node, stable dating back to 2006 heart is enlarged. No pericardial effusion. Coronary arterial calcifications. Aorta and main pulmonary artery are normal in caliber.  Central airways are patent. Subpleural ground-glass and consolidative opacities within the bilateral lower lobes. Additionally there are scattered patchy ground-glass opacities. There is a 1.5 cm focal area ground-glass within the right upper lobe (image 71; series 47). 5 mm subpleural ground-glass opacity within the left lower lobe (image 41; series 406). Probable trace bilateral pleural effusions.  Limited visualization of the upper abdomen demonstrates free intraperitoneal air. Atrophy of the pancreas. Small hiatal hernia.  Multilevel degenerative change of the thoracic spine.  Review of the MIP images confirms the above findings.  IMPRESSION: 1. Free intraperitoneal air is demonstrated within the upper abdomen. Recommend correlation with abdominal pelvic CT as this is concerning for perforation. 2. Evaluation for pulmonary embolism is limited secondary to motion artifact. No evidence for central, main, lobar or proximal segmental pulmonary emboli. Evaluation of the distal segmental and subsegmental pulmonary arteries are limited due to motion artifact. 3. Bilateral lower lobe ground-glass and subpleural consolidative opacities favored represents atelectasis. Additionally scattered  ground-glass pulmonary opacities 4. More focal 1.5 cm area of ground-glass attenuation within the right upper lobe. Follow-up chest CT in 3 months is recommended to assess for persistence. Critical Value/emergent results were called by telephone at the time of interpretation on 07/18/2014 at 11:27 am to Dr. Victorino Dike, who verbally acknowledged these results.   Electronically Signed   By: Annia Belt M.D.   On: 07/18/2014 11:27   Dg Chest Port 1 View  07/18/2014   CLINICAL DATA:  Fever.  Abnormal labs.  EXAM: PORTABLE CHEST - 1 VIEW  COMPARISON:  07/16/2014  FINDINGS: Shallow inspiration with atelectasis in the lung bases. Heart size and pulmonary vascularity appear normal. No focal consolidation in the lungs. No blunting of  costophrenic angles. No pneumothorax. Calcification of the aorta. Degenerative changes in the spine and shoulders.  IMPRESSION: Shallow inspiration with atelectasis in the lung bases appear   Electronically Signed   By: Burman Nieves M.D.   On: 07/18/2014 06:45   Dg Abd Portable 1v  07/17/2014   CLINICAL DATA:  Abdominal pain and vomiting.  EXAM: PORTABLE ABDOMEN - 1 VIEW  COMPARISON:  02/01/2011 and pelvis radiograph dated 07/16/2014.  FINDINGS: Normal bowel gas pattern. Stable 2.2 cm oval calcification in the region of the lower pole of the right kidney. Stable right hip prosthesis. Thoracolumbar spine degenerative changes and scoliosis.  IMPRESSION: No acute abnormality.   Electronically Signed   By: Gordan Payment M.D.   On: 07/17/2014 23:04    CBC  Recent Labs Lab 07/17/14 0012 07/18/14 0437 07/19/14 0850  WBC 9.4 17.0* 15.2*  HGB 11.5* 13.1 13.1  HCT 34.5* 40.0 39.6  PLT 387 452* 484*  MCV 90.1 90.3 89.6  MCH 30.0 29.6 29.6  MCHC 33.3 32.8 33.1  RDW 14.5 14.6 14.7  LYMPHSABS 1.0  --   --   MONOABS 0.7  --   --   EOSABS 0.0  --   --   BASOSABS 0.0  --   --     Chemistries   Recent Labs Lab 07/17/14 0012 07/18/14 0437 07/19/14 0850  NA 137 139 141  K 4.8 5.1  4.6  CL 99 102 103  CO2 GLUCOSE 135* 123* 121*  BUN 17 22 29*  CREATININE 1.21* 1.28* 1.38*  CALCIUM 9.5 9.2 9.1  AST 20  --  17  ALT 14  --  13  ALKPHOS 92  --  84  BILITOT 0.4  --  0.6   ------------------------------------------------------------------------------------------------------------------ estimated creatinine clearance is 21.8 ml/min (by C-G formula based on Cr of 1.38). ------------------------------------------------------------------------------------------------------------------ No results found for this basename: HGBA1C,  in the last 72 hours ------------------------------------------------------------------------------------------------------------------ No results found for this basename: CHOL, HDL, LDLCALC, TRIG, CHOLHDL, LDLDIRECT,  in the last 72 hours ------------------------------------------------------------------------------------------------------------------  Recent Labs  07/18/14 1620  TSH 1.210   ------------------------------------------------------------------------------------------------------------------  Recent Labs  07/18/14 1250  VITAMINB12 317    Coagulation profile  Recent Labs Lab 07/18/14 1620  INR 1.11    No results found for this basename: DDIMER,  in the last 72 hours  Cardiac Enzymes No results found for this basename: CK, CKMB, TROPONINI, MYOGLOBIN,  in the last 168 hours ------------------------------------------------------------------------------------------------------------------ No components found with this basename: POCBNP,     Harshil Cavallaro D.O. on 07/19/2014 at 9:52 AM  Between 7am to 7pm - Pager - [phone]  After 7pm go to [email or web address] - password [email or web address]  And look for the night coverage person covering for me after hours  [facility] Office  [phone]

## 2014-07-19 NOTE — Progress Notes (Signed)
Subjective: C/O abdominal pain  Objective: Vital signs in last 24 hours: Temp:  [98.2 F (36.8 C)-99.8 F (37.7 C)] 98.2 F (36.8 C) (08/29 0541) Pulse Rate:  [96-129] 129 (08/29 0541) Resp:  [16] 16 (08/29 0541) BP: (100-120)/(48-75) 100/48 mmHg (08/29 0541) SpO2:  [98 %-99 %] 98 % (08/29 0541) Last BM Date: 07/15/14  Intake/Output from previous day:   Intake/Output this shift:    General appearance: cooperative Resp: clear to auscultation bilaterally GI: soft but tender mis abdomen with guarding  Lab Results:   Recent Labs  07/18/14 0437 07/19/14 0850  WBC 17.0* 15.2*  HGB 13.1 13.1  HCT 40.0 39.6  PLT 452* 484*   BMET  Recent Labs  07/18/14 0437 07/19/14 0850  NA 139 141  K 5.1 4.6  CL 102 103  CO2 23 20  GLUCOSE 123* 121*  BUN 22 29*  CREATININE 1.28* 1.38*  CALCIUM 9.2 9.1   PT/INR  Recent Labs  07/18/14 1620  LABPROT 14.3  INR 1.11   ABG No results found for this basename: PHART, PCO2, PO2, HCO3,  in the last 72 hours  Studies/Results: Ct Angio Chest Pe W/cm &/or Wo Cm  07/18/2014   CLINICAL DATA:  Recent left lower extremity surgery. Tachycardia. Immobilization. Evaluate for pulmonary embolism.  EXAM: CT ANGIOGRAPHY CHEST WITH CONTRAST  TECHNIQUE: Multidetector CT imaging of the chest was performed using the standard protocol during bolus administration of intravenous contrast. Multiplanar CT image reconstructions and MIPs were obtained to evaluate the vascular anatomy.  CONTRAST:  70mL OMNIPAQUE IOHEXOL 350 MG/ML SOLN  COMPARISON:  Chest radiograph 07/18/2014 ; chest CT 07/22/2005  FINDINGS: Motion artifact limits evaluation for the assessment of pulmonary embolism. No evidence for central, main, lobar or segmental pulmonary embolism. Evaluation of the distal segmental and subsegmental pulmonary arteries limited due to motion artifact.  No enlarged axillary, mediastinal or hilar lymphadenopathy. Sub cm right peritracheal lymph node, stable  dating back to 2006 heart is enlarged. No pericardial effusion. Coronary arterial calcifications. Aorta and main pulmonary artery are normal in caliber.  Central airways are patent. Subpleural ground-glass and consolidative opacities within the bilateral lower lobes. Additionally there are scattered patchy ground-glass opacities. There is a 1.5 cm focal area ground-glass within the right upper lobe (image 71; series 47). 5 mm subpleural ground-glass opacity within the left lower lobe (image 41; series 406). Probable trace bilateral pleural effusions.  Limited visualization of the upper abdomen demonstrates free intraperitoneal air. Atrophy of the pancreas. Small hiatal hernia.  Multilevel degenerative change of the thoracic spine.  Review of the MIP images confirms the above findings.  IMPRESSION: 1. Free intraperitoneal air is demonstrated within the upper abdomen. Recommend correlation with abdominal pelvic CT as this is concerning for perforation. 2. Evaluation for pulmonary embolism is limited secondary to motion artifact. No evidence for central, main, lobar or proximal segmental pulmonary emboli. Evaluation of the distal segmental and subsegmental pulmonary arteries are limited due to motion artifact. 3. Bilateral lower lobe ground-glass and subpleural consolidative opacities favored represents atelectasis. Additionally scattered ground-glass pulmonary opacities 4. More focal 1.5 cm area of ground-glass attenuation within the right upper lobe. Follow-up chest CT in 3 months is recommended to assess for persistence. Critical Value/emergent results were called by telephone at the time of interpretation on 07/18/2014 at 11:27 am to Dr. Victorino Dike, who verbally acknowledged these results.   Electronically Signed   By: Annia Belt M.D.   On: 07/18/2014 11:27   Dg Chest [facility] 1 [address]  07/18/2014   CLINICAL DATA:  Fever.  Abnormal labs.  EXAM: PORTABLE CHEST - 1 VIEW  COMPARISON:  07/16/2014  FINDINGS: Shallow  inspiration with atelectasis in the lung bases. Heart size and pulmonary vascularity appear normal. No focal consolidation in the lungs. No blunting of costophrenic angles. No pneumothorax. Calcification of the aorta. Degenerative changes in the spine and shoulders.  IMPRESSION: Shallow inspiration with atelectasis in the lung bases appear   Electronically Signed   By: Burman Nieves M.D.   On: 07/18/2014 06:45   Dg Abd Portable 1v  07/17/2014   CLINICAL DATA:  Abdominal pain and vomiting.  EXAM: PORTABLE ABDOMEN - 1 VIEW  COMPARISON:  02/01/2011 and pelvis radiograph dated 07/16/2014.  FINDINGS: Normal bowel gas pattern. Stable 2.2 cm oval calcification in the region of the lower pole of the right kidney. Stable right hip prosthesis. Thoracolumbar spine degenerative changes and scoliosis.  IMPRESSION: No acute abnormality.   Electronically Signed   By: Gordan Payment M.D.   On: 07/17/2014 23:04    Anti-infectives: Anti-infectives   Start     Dose/Rate Route Frequency Ordered Stop   07/18/14 1400  piperacillin-tazobactam (ZOSYN) IVPB 3.375 g     3.375 g 12.5 mL/hr over 240 Minutes Intravenous 3 times per day 07/18/14 1228     07/17/14 1000  sulfamethoxazole-trimethoprim (BACTRIM DS) 800-160 MG per tablet 1 tablet  Status:  Discontinued     1 tablet Oral Daily 07/17/14 0819 07/18/14 1228      Assessment/Plan: Pneumoperitoneum - likely due to perforated colon. No surgical intervention per family. I spoke to her daughter again this AM and she reiterates this. Zosyn and NPO.  LOS: 3 days    Kelli Pena E 07/19/2014

## 2014-07-20 ENCOUNTER — Encounter (HOSPITAL_COMMUNITY): Payer: Self-pay | Admitting: Radiology

## 2014-07-20 ENCOUNTER — Inpatient Hospital Stay (HOSPITAL_COMMUNITY): Payer: Medicare Other

## 2014-07-20 LAB — BASIC METABOLIC PANEL
ANION GAP: 19 — AB (ref 5–15)
BUN: 37 mg/dL — ABNORMAL HIGH (ref 6–23)
CHLORIDE: 104 meq/L (ref 96–112)
CO2: 19 mEq/L (ref 19–32)
Calcium: 9.4 mg/dL (ref 8.4–10.5)
Creatinine, Ser: 1.23 mg/dL — ABNORMAL HIGH (ref 0.50–1.10)
GFR, EST AFRICAN AMERICAN: 43 mL/min — AB (ref >90)
GFR, EST NON AFRICAN AMERICAN: 37 mL/min — AB (ref >90)
Glucose, Bld: 140 mg/dL — ABNORMAL HIGH (ref 70–99)
Potassium: 4 mEq/L (ref 3.7–5.3)
SODIUM: 142 meq/L (ref 137–147)

## 2014-07-20 LAB — CBC
HCT: 37.8 % (ref 36.0–46.0)
Hemoglobin: 12.3 g/dL (ref 12.0–15.0)
MCH: 30 pg (ref 26.0–34.0)
MCHC: 32.5 g/dL (ref 30.0–36.0)
MCV: 92.2 fL (ref 78.0–100.0)
PLATELETS: 504 10*3/uL — AB (ref 150–400)
RBC: 4.1 MIL/uL (ref 3.87–5.11)
RDW: 14.9 % (ref 11.5–15.5)
WBC: 12.1 10*3/uL — ABNORMAL HIGH (ref 4.0–10.5)

## 2014-07-20 LAB — URINE CULTURE: Colony Count: 3000

## 2014-07-20 LAB — GLUCOSE, CAPILLARY
GLUCOSE-CAPILLARY: 130 mg/dL — AB (ref 70–99)
GLUCOSE-CAPILLARY: 132 mg/dL — AB (ref 70–99)
Glucose-Capillary: 174 mg/dL — ABNORMAL HIGH (ref 70–99)

## 2014-07-20 MED ORDER — DEXTROSE-NACL 5-0.45 % IV SOLN
INTRAVENOUS | Status: DC
  Administered 2014-07-20 – 2014-07-24 (×5): via INTRAVENOUS

## 2014-07-20 MED ORDER — IOHEXOL 300 MG/ML  SOLN
100.0000 mL | Freq: Once | INTRAMUSCULAR | Status: AC | PRN
  Administered 2014-07-20: 100 mL via INTRAVENOUS

## 2014-07-20 NOTE — Progress Notes (Signed)
Subjective:     Patient reports pain as 2 on 0-10 scale. Doing well. Family states she had a small perforation in her bowel and is being treated by observation. WBC is decreasing and she is afebrile.   Objective: Vital signs in last 24 hours: Temp:  [98 F (36.7 C)-99.2 F (37.3 C)] 98 F (36.7 C) (08/30 7829) Pulse Rate:  [109-120] 120 (08/30 0613) Resp:  [16-18] 18 (08/30 0613) BP: (123-155)/(56-112) 155/86 mmHg (08/30 0654) SpO2:  [94 %-98 %] 94 % (08/30 0613)  Intake/Output from previous day: 08/29 0701 - 08/30 0700 In: 0  Out: 400 [Urine:400] Intake/Output this shift:     Recent Labs  07/18/14 0437 07/19/14 0850 07/20/14 0530  HGB 13.1 13.1 12.3    Recent Labs  07/19/14 0850 07/20/14 0530  WBC 15.2* 12.1*  RBC 4.42 4.10  HCT 39.6 37.8  PLT 484* 504*    Recent Labs  07/19/14 0850 07/20/14 0530  NA 141 142  K 4.6 4.0  CL 103 104  CO2 20 19  BUN 29* 37*  CREATININE 1.38* 1.23*  GLUCOSE 121* 140*  CALCIUM 9.1 9.4    Recent Labs  07/18/14 1620  INR 1.11    Neurologically intact  Assessment/Plan:     Up with therapy  Rylea Selway A 07/20/2014, 8:34 AM

## 2014-07-20 NOTE — Progress Notes (Signed)
Subjective: Pt off the floor for CT scan.  Per daughter no complaints this AM  Objective: Vital signs in last 24 hours: Temp:  [98 F (36.7 C)-99.2 F (37.3 C)] 98 F (36.7 C) (08/30 1610) Pulse Rate:  [109-120] 120 (08/30 0613) Resp:  [16-18] 18 (08/30 0613) BP: (123-155)/(56-112) 155/86 mmHg (08/30 0654) SpO2:  [94 %-98 %] 94 % (08/30 0613) Last BM Date: 07/19/14  Intake/Output from previous day: 08/29 0701 - 08/30 0700 In: 0  Out: 400 [Urine:400] Intake/Output this shift:     Lab Results:   Recent Labs  07/19/14 0850 07/20/14 0530  WBC 15.2* 12.1*  HGB 13.1 12.3  HCT 39.6 37.8  PLT 484* 504*   BMET  Recent Labs  07/19/14 0850 07/20/14 0530  NA 141 142  K 4.6 4.0  CL 103 104  CO2 20 19  GLUCOSE 121* 140*  BUN 29* 37*  CREATININE 1.38* 1.23*  CALCIUM 9.1 9.4   PT/INR  Recent Labs  07/18/14 1620  LABPROT 14.3  INR 1.11   ABG No results found for this basename: PHART, PCO2, PO2, HCO3,  in the last 72 hours  Studies/Results: Ct Angio Chest Pe W/cm &/or Wo Cm  07/18/2014   CLINICAL DATA:  Recent left lower extremity surgery. Tachycardia. Immobilization. Evaluate for pulmonary embolism.  EXAM: CT ANGIOGRAPHY CHEST WITH CONTRAST  TECHNIQUE: Multidetector CT imaging of the chest was performed using the standard protocol during bolus administration of intravenous contrast. Multiplanar CT image reconstructions and MIPs were obtained to evaluate the vascular anatomy.  CONTRAST:  70mL OMNIPAQUE IOHEXOL 350 MG/ML SOLN  COMPARISON:  Chest radiograph 07/18/2014 ; chest CT 07/22/2005  FINDINGS: Motion artifact limits evaluation for the assessment of pulmonary embolism. No evidence for central, main, lobar or segmental pulmonary embolism. Evaluation of the distal segmental and subsegmental pulmonary arteries limited due to motion artifact.  No enlarged axillary, mediastinal or hilar lymphadenopathy. Sub cm right peritracheal lymph node, stable dating back to 2006  heart is enlarged. No pericardial effusion. Coronary arterial calcifications. Aorta and main pulmonary artery are normal in caliber.  Central airways are patent. Subpleural ground-glass and consolidative opacities within the bilateral lower lobes. Additionally there are scattered patchy ground-glass opacities. There is a 1.5 cm focal area ground-glass within the right upper lobe (image 71; series 47). 5 mm subpleural ground-glass opacity within the left lower lobe (image 41; series 406). Probable trace bilateral pleural effusions.  Limited visualization of the upper abdomen demonstrates free intraperitoneal air. Atrophy of the pancreas. Small hiatal hernia.  Multilevel degenerative change of the thoracic spine.  Review of the MIP images confirms the above findings.  IMPRESSION: 1. Free intraperitoneal air is demonstrated within the upper abdomen. Recommend correlation with abdominal pelvic CT as this is concerning for perforation. 2. Evaluation for pulmonary embolism is limited secondary to motion artifact. No evidence for central, main, lobar or proximal segmental pulmonary emboli. Evaluation of the distal segmental and subsegmental pulmonary arteries are limited due to motion artifact. 3. Bilateral lower lobe ground-glass and subpleural consolidative opacities favored represents atelectasis. Additionally scattered ground-glass pulmonary opacities 4. More focal 1.5 cm area of ground-glass attenuation within the right upper lobe. Follow-up chest CT in 3 months is recommended to assess for persistence. Critical Value/emergent results were called by telephone at the time of interpretation on 07/18/2014 at 11:27 am to Dr. Victorino Dike, who verbally acknowledged these results.   Electronically Signed   By: Annia Belt M.D.   On: 07/18/2014 11:27  Anti-infectives: Anti-infectives   Start     Dose/Rate Route Frequency Ordered Stop   07/18/14 1400  piperacillin-tazobactam (ZOSYN) IVPB 3.375 g     3.375 g 12.5 mL/hr over  240 Minutes Intravenous 3 times per day 07/18/14 1228     07/17/14 1000  sulfamethoxazole-trimethoprim (BACTRIM DS) 800-160 MG per tablet 1 tablet  Status:  Discontinued     1 tablet Oral Daily 07/17/14 0819 07/18/14 1228      Assessment/Plan: Pneumoperitoneum, perforated viscus  1. CT in progress. Will review 2. According to daughter the patient does not want surgery.    LOS: 4 days    Marigene Ehlers., [facility] 07/20/2014

## 2014-07-20 NOTE — Progress Notes (Addendum)
[facility]                                                                              Patient Demographics  Kelli Pena, is a 80 y.o. female, DOB - 08-22-1920, WJX:[id number]  Admit date - 07/16/2014   Admitting Physician Toni Arthurs, MD  Outpatient Primary MD for the patient is [name], MD  LOS - 4   Chief Complaint  Patient presents with  . Fall  . Leg Pain      HPI/Consult  80 year old female with past medical history of hypertension, diabetes mellitus, arthritis, who presented after falling and experiencing a left tibial fracture. Patient was seen by orthopedics, and placed in a long-leg cast. She was admitted for observation. TRH was called tilted for altered mental status. Patient currently does not recall why she is in the hospital and is not oriented to place or time. At the time of evaluation, she was arousable however very lethargic. Patient could not answer all questions and could only follow some commands.   Assessment & Plan  SIRS secondary to unknown the source -Leukocytosis improving, patient afebrile -Chest x-ray shows atelectasis in the lung bases, no infiltration  -Abdominal x-ray shows no acute abnormality  -UA from 07/17/2014 and 8/29 negative for infection -Blood cultures show no growth to date -Possibly secondary to perforated colon  Pneumoperitoneum -Unknown etiology -CTA of the chest showed free intraperitoneal air -General surgery consulted and appreciated -Patient's daughter wished to try conservative management with antibiotics and pain medications as needed -Continue Zosyn and IVF, NPO status -Will obtain CT abd/pelvis with IV and water soluble contrast -Spoke with general surgery, Dr. Derrell Lolling, if patient's CT shows no perf, may start patient on clear liquids  Altered mental status  -Resolved -Secondary to unknown cause at this time, possibly secondary to narcotics vs pneumoperitoneum  -Vitamin B12 317, ammonia 19, TSH 1.2 (all  within normal limits)  Left tibia fracture  -Followed by orthopedics, currently in cast   Hypothyroidism -Continue Synthroid 37.5 mcg IV daily   Restless leg syndrome -Requip held due to n.p.o. status  Depression -Cymbalta held due to to n.p.o. status  Hypertension -Losartan held due to n.p.o. Status -Continue Lasix, 20 mg IV daily -Will add on medications if needed  History of Diabetes mellitus, type II -Currently on no home medications, possibly diet controlled -Will add on medications if needed  Possibly Acute kidney injury, on chronic kidney disease -2014, patient's creatinine on 0.6 however in May of 2015 and creatinine is 1.4 -Currently 1.38 -Unsure whether this is her new baseline -Will continue to monitor  Code Status: Full  Family Communication: Daughter at bedside  Disposition Plan: Admitted, pending further recommendations from surgery  Time Spent in minutes   30 minutes  Procedures  None  Consults   General surgery Orthopedic surgery  DVT Prophylaxis  Lovenox  Lab Results  Component Value Date   PLT 504* 07/20/2014    Medications  Scheduled Meds: . aspirin  300 mg Rectal Daily  . enoxaparin (LOVENOX) injection  30 mg Subcutaneous Q24H  . furosemide  20 mg Intravenous Daily  . levothyroxine  37.5 mcg Intravenous Daily  . pantoprazole (PROTONIX) IV  40 mg Intravenous Q12H  . piperacillin-tazobactam (ZOSYN)  IV  3.375 g Intravenous 3 times per day  . pneumococcal 23 valent vaccine  0.5 mL Intramuscular Tomorrow-1000   Continuous Infusions: . sodium chloride 75 mL/hr at 07/20/14 0334   PRN Meds:.acetaminophen, fentaNYL, promethazine  Antibiotics    Anti-infectives   Start     Dose/Rate Route Frequency Ordered Stop   07/18/14 1400  piperacillin-tazobactam (ZOSYN) IVPB 3.375 g     3.375 g 12.5 mL/hr over 240 Minutes Intravenous 3 times per day 07/18/14 1228     07/17/14 1000  sulfamethoxazole-trimethoprim (BACTRIM DS) 800-160 MG per  tablet 1 tablet  Status:  Discontinued     1 tablet Oral Daily 07/17/14 0819 07/18/14 1228        Subjective:   [name] seen and examined today. Patient stated she did not sleep well last night due to her restless leg syndrome.  Patient would like a glass of tea and ice chips.  She denies any further abdominal pain, nausea, vomiting.  She has no other complaints and denies chest pain, shortness of breath, dizziness, headache.  Objective:   Filed Vitals:   07/19/14 1454 07/19/14 2017 07/20/14 0613 07/20/14 0654  BP: 144/56 123/86 131/112 155/86  Pulse: 117 109 120   Temp: 99.2 F (37.3 C) 98.9 F (37.2 C) 98 F (36.7 C)   TempSrc: Oral Oral Oral   Resp: Height:      Weight:      SpO2: 98% 97% 94%     Wt Readings from Last 3 Encounters:  07/16/14 67.586 kg (149 lb)  07/17/13 67.586 kg (149 lb)  03/21/13 68.312 kg (150 lb 9.6 oz)     Intake/Output Summary (Last 24 hours) at 07/20/14 0947 Last data filed at 07/19/14 1455  Gross per 24 hour  Intake      0 ml  Output    400 ml  Net   -400 ml    Exam General: Well developed, well nourished, NAD, appears stated age  HEENT: NCAT, mucous membranes moist.  Cardiovascular: S1 S2 auscultated, tachycardic Respiratory: Clear to auscultation bilaterally with equal chest rise  Abdomen: Soft, nontender, nondistended, + bowel sounds  Extremities: warm dry without cyanosis clubbing or edema, left lower extremity in long leg cast  Neuro: AAO x3, no focal deficits Psych: Pleasant mood and affect.   Data Review   Micro Results Recent Results (from the past 240 hour(s))  CULTURE, BLOOD (ROUTINE X 2)     Status: None   Collection Time    07/18/14  9:07 AM      Result Value Ref Range Status   Specimen Description BLOOD RIGHT HAND   Final   Special Requests BOTTLES DRAWN AEROBIC AND ANAEROBIC 10CC   Final   Culture  Setup Time     Final   Value: 07/18/2014 12:45     Performed at [facility]   Culture      Final   Value:        BLOOD CULTURE RECEIVED NO GROWTH TO DATE CULTURE WILL BE HELD FOR 5 DAYS BEFORE ISSUING A FINAL NEGATIVE REPORT     Performed at [facility]   Report Status PENDING   Incomplete  CULTURE, BLOOD (ROUTINE X 2)     Status: None   Collection Time    07/18/14  9:20 AM      Result Value Ref Range Status   Specimen Description BLOOD LEFT  HAND   Final   Special Requests     Final   Value: BOTTLES DRAWN AEROBIC AND ANAEROBIC 10CC BLUE, 5CC RED   Culture  Setup Time     Final   Value: 07/18/2014 12:45     Performed at [facility]   Culture     Final   Value:        BLOOD CULTURE RECEIVED NO GROWTH TO DATE CULTURE WILL BE HELD FOR 5 DAYS BEFORE ISSUING A FINAL NEGATIVE REPORT     Performed at [facility]   Report Status PENDING   Incomplete  URINE CULTURE     Status: None   Collection Time    07/18/14  8:30 PM      Result Value Ref Range Status   Specimen Description URINE, CLEAN CATCH   Final   Special Requests NONE   Final   Culture  Setup Time     Final   Value: 07/18/2014 00:38     Performed at [facility] Count     Final   Value: 3,000 COLONIES/ML     Performed at [facility]   Culture     Final   Value: INSIGNIFICANT GROWTH     Performed at [facility]   Report Status 07/20/2014 FINAL   Final    Radiology Reports Dg Chest 2 View  07/16/2014   CLINICAL DATA:  Larey Seat this morning.  EXAM: CHEST  2 VIEW  COMPARISON:  07/22/2005.  FINDINGS: Normal sized heart. Clear lungs. Thoracic spine degenerative changes. 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 No fracture or pneumothorax.  IMPRESSION: No acute abnormality.   Electronically Signed   By: Gordan Payment M.D.   On: 07/16/2014 23:34   Dg Pelvis 1-2 Views  07/16/2014   CLINICAL DATA:  Fall  EXAM: PELVIS - 1-2 VIEW  COMPARISON:  Prior radiograph from 02/01/2011  FINDINGS: Right total hip arthroplasty in place.  The acetabular and femoral components articulate normally with 1 another. No acute fracture or dislocation. Bony pelvis is intact. SI joints are approximated. No pubic dye stasis.  Prominent degenerative changes noted within the lower lumbar spine.  No soft tissue abnormality.  Osteopenia noted. Vascular calcifications present within the upper thighs.  IMPRESSION: 1. No acute fracture or dislocation. 2. Right hip arthroplasty in place without evidence of hardware complication.   Electronically Signed   By: Rise Mu M.D.   On: 07/16/2014 23:34   Dg Femur Left  07/16/2014   CLINICAL DATA:  Larey Seat at 6 a.m. this morning.  Left lower leg pain.  EXAM: LEFT FEMUR - 2 VIEW  COMPARISON:  None.  FINDINGS: There is no evidence of fracture or other focal bone lesions. Soft tissues are unremarkable. Prominent degenerative changes in the left knee. Vascular calcifications.  IMPRESSION: No acute bony abnormalities.   Electronically Signed   By: Burman Nieves M.D.   On: 07/16/2014 23:31   Dg Tibia/fibula Left  07/16/2014   CLINICAL DATA:  Left lower leg pain after a fall at 6 a.m. this morning.  EXAM: LEFT TIBIA AND FIBULA - 2 VIEW  COMPARISON:  12/31/2013  FINDINGS: Acute mostly transverse fracture of the proximal/ mid shaft of the left tibia without significant displacement. Marked degenerative changes in the left knee. Left fibula appears intact.  IMPRESSION: Fracture of the proximal/ mid shaft of the left tibia without significant displacement.   Electronically Signed   By:  Burman Nieves M.D.   On: 07/16/2014 23:32   Ct Angio Chest Pe W/cm &/or Wo Cm  07/18/2014   CLINICAL DATA:  Recent left lower extremity surgery. Tachycardia. Immobilization. Evaluate for pulmonary embolism.  EXAM: CT ANGIOGRAPHY CHEST WITH CONTRAST  TECHNIQUE: Multidetector CT imaging of the chest was performed using the standard protocol during bolus administration of intravenous contrast. Multiplanar CT image reconstructions and  MIPs were obtained to evaluate the vascular anatomy.  CONTRAST:  70mL OMNIPAQUE IOHEXOL 350 MG/ML SOLN  COMPARISON:  Chest radiograph 07/18/2014 ; chest CT 07/22/2005  FINDINGS: Motion artifact limits evaluation for the assessment of pulmonary embolism. No evidence for central, main, lobar or segmental pulmonary embolism. Evaluation of the distal segmental and subsegmental pulmonary arteries limited due to motion artifact.  No enlarged axillary, mediastinal or hilar lymphadenopathy. Sub cm right peritracheal lymph node, stable dating back to 2006 heart is enlarged. No pericardial effusion. Coronary arterial calcifications. Aorta and main pulmonary artery are normal in caliber.  Central airways are patent. Subpleural ground-glass and consolidative opacities within the bilateral lower lobes. Additionally there are scattered patchy ground-glass opacities. There is a 1.5 cm focal area ground-glass within the right upper lobe (image 71; series 47). 5 mm subpleural ground-glass opacity within the left lower lobe (image 41; series 406). Probable trace bilateral pleural effusions.  Limited visualization of the upper abdomen demonstrates free intraperitoneal air. Atrophy of the pancreas. Small hiatal hernia.  Multilevel degenerative change of the thoracic spine.  Review of the MIP images confirms the above findings.  IMPRESSION: 1. Free intraperitoneal air is demonstrated within the upper abdomen. Recommend correlation with abdominal pelvic CT as this is concerning for perforation. 2. Evaluation for pulmonary embolism is limited secondary to motion artifact. No evidence for central, main, lobar or proximal segmental pulmonary emboli. Evaluation of the distal segmental and subsegmental pulmonary arteries are limited due to motion artifact. 3. Bilateral lower lobe ground-glass and subpleural consolidative opacities favored represents atelectasis. Additionally scattered ground-glass pulmonary opacities 4. More focal 1.5 cm area  of ground-glass attenuation within the right upper lobe. Follow-up chest CT in 3 months is recommended to assess for persistence. Critical Value/emergent results were called by telephone at the time of interpretation on 07/18/2014 at 11:27 am to Dr. Victorino Dike, who verbally acknowledged these results.   Electronically Signed   By: Annia Belt M.D.   On: 07/18/2014 11:27   Dg Chest Port 1 View  07/18/2014   CLINICAL DATA:  Fever.  Abnormal labs.  EXAM: PORTABLE CHEST - 1 VIEW  COMPARISON:  07/16/2014  FINDINGS: Shallow inspiration with atelectasis in the lung bases. Heart size and pulmonary vascularity appear normal. No focal consolidation in the lungs. No blunting of costophrenic angles. No pneumothorax. Calcification of the aorta. Degenerative changes in the spine and shoulders.  IMPRESSION: Shallow inspiration with atelectasis in the lung bases appear   Electronically Signed   By: Burman Nieves M.D.   On: 07/18/2014 06:45   Dg Abd Portable 1v  07/17/2014   CLINICAL DATA:  Abdominal pain and vomiting.  EXAM: PORTABLE ABDOMEN - 1 VIEW  COMPARISON:  02/01/2011 and pelvis radiograph dated 07/16/2014.  FINDINGS: Normal bowel gas pattern. Stable 2.2 cm oval calcification in the region of the lower pole of the right kidney. Stable right hip prosthesis. Thoracolumbar spine degenerative changes and scoliosis.  IMPRESSION: No acute abnormality.   Electronically Signed   By: Gordan Payment M.D.   On: 07/17/2014 23:04    CBC  Recent Labs  Lab 07/17/14 0012 07/18/14 0437 07/19/14 0850 07/20/14 0530  WBC 9.4 17.0* 15.2* 12.1*  HGB 11.5* 13.1 13.1 12.3  HCT 34.5* 40.0 39.6 37.8  PLT 387 452* 484* 504*  MCV 90.1 90.3 89.6 92.2  MCH 30.0 29.6 29.6 30.0  MCHC 33.3 32.8 33.1 32.5  RDW 14.5 14.6 14.7 14.9  LYMPHSABS 1.0  --   --   --   MONOABS 0.7  --   --   --   EOSABS 0.0  --   --   --   BASOSABS 0.0  --   --   --     Chemistries   Recent Labs Lab 07/17/14 0012 07/18/14 0437 07/19/14 0850  07/20/14 0530  NA 137 139 141 142  K 4.8 5.1 4.6 4.0  CL 99 102 103 104  CO2 GLUCOSE 135* 123* 121* 140*  BUN 17 22 29* 37*  CREATININE 1.21* 1.28* 1.38* 1.23*  CALCIUM 9.5 9.2 9.1 9.4  AST 20  --  17  --   ALT 14  --  13  --   ALKPHOS 92  --  84  --   BILITOT 0.4  --  0.6  --    ------------------------------------------------------------------------------------------------------------------ estimated creatinine clearance is 24.4 ml/min (by C-G formula based on Cr of 1.23). ------------------------------------------------------------------------------------------------------------------ No results found for this basename: HGBA1C,  in the last 72 hours ------------------------------------------------------------------------------------------------------------------ No results found for this basename: CHOL, HDL, LDLCALC, TRIG, CHOLHDL, LDLDIRECT,  in the last 72 hours ------------------------------------------------------------------------------------------------------------------  Recent Labs  07/18/14 1620  TSH 1.210   ------------------------------------------------------------------------------------------------------------------  Recent Labs  07/18/14 1250  VITAMINB12 317    Coagulation profile  Recent Labs Lab 07/18/14 1620  INR 1.11    No results found for this basename: DDIMER,  in the last 72 hours  Cardiac Enzymes No results found for this basename: CK, CKMB, TROPONINI, MYOGLOBIN,  in the last 168 hours ------------------------------------------------------------------------------------------------------------------ No components found with this basename: POCBNP,     Isaiah Cianci D.O. on 07/20/2014 at 9:47 AM  Between 7am to 7pm - Pager - [phone]  After 7pm go to [email or web address] - password [email or web address]  And look for the night coverage person covering for me after hours  [facility] Office  [phone]

## 2014-07-21 ENCOUNTER — Encounter (HOSPITAL_COMMUNITY): Payer: Self-pay | Admitting: Physician Assistant

## 2014-07-21 DIAGNOSIS — F411 Generalized anxiety disorder: Secondary | ICD-10-CM

## 2014-07-21 DIAGNOSIS — G894 Chronic pain syndrome: Secondary | ICD-10-CM

## 2014-07-21 DIAGNOSIS — I4891 Unspecified atrial fibrillation: Secondary | ICD-10-CM

## 2014-07-21 LAB — BASIC METABOLIC PANEL
Anion gap: 17 — ABNORMAL HIGH (ref 5–15)
BUN: 35 mg/dL — AB (ref 6–23)
CO2: 22 mEq/L (ref 19–32)
Calcium: 9.3 mg/dL (ref 8.4–10.5)
Chloride: 109 mEq/L (ref 96–112)
Creatinine, Ser: 0.98 mg/dL (ref 0.50–1.10)
GFR, EST AFRICAN AMERICAN: 56 mL/min — AB (ref >90)
GFR, EST NON AFRICAN AMERICAN: 49 mL/min — AB (ref >90)
Glucose, Bld: 155 mg/dL — ABNORMAL HIGH (ref 70–99)
POTASSIUM: 3.9 meq/L (ref 3.7–5.3)
SODIUM: 148 meq/L — AB (ref 137–147)

## 2014-07-21 LAB — GLUCOSE, CAPILLARY: Glucose-Capillary: 141 mg/dL — ABNORMAL HIGH (ref 70–99)

## 2014-07-21 LAB — CBC
HEMATOCRIT: 35.7 % — AB (ref 36.0–46.0)
HEMOGLOBIN: 11.7 g/dL — AB (ref 12.0–15.0)
MCH: 29.5 pg (ref 26.0–34.0)
MCHC: 32.8 g/dL (ref 30.0–36.0)
MCV: 89.9 fL (ref 78.0–100.0)
Platelets: 603 10*3/uL — ABNORMAL HIGH (ref 150–400)
RBC: 3.97 MIL/uL (ref 3.87–5.11)
RDW: 14.7 % (ref 11.5–15.5)
WBC: 9.7 10*3/uL (ref 4.0–10.5)

## 2014-07-21 LAB — PHOSPHORUS: Phosphorus: 3.2 mg/dL (ref 2.3–4.6)

## 2014-07-21 LAB — MAGNESIUM: Magnesium: 2.4 mg/dL (ref 1.5–2.5)

## 2014-07-21 MED ORDER — DILTIAZEM HCL 100 MG IV SOLR
5.0000 mg/h | INTRAVENOUS | Status: DC
  Administered 2014-07-21 – 2014-07-24 (×7): 10 mg/h via INTRAVENOUS
  Filled 2014-07-21 (×9): qty 100

## 2014-07-21 MED ORDER — METOPROLOL TARTRATE 1 MG/ML IV SOLN: 5.0000 mg | Freq: Three times a day (TID) | INTRAVENOUS | Status: DC

## 2014-07-21 MED ORDER — DILTIAZEM LOAD VIA INFUSION
10.0000 mg | Freq: Once | INTRAVENOUS | Status: AC
  Administered 2014-07-21: 10 mg via INTRAVENOUS
  Filled 2014-07-21: qty 10

## 2014-07-21 NOTE — Progress Notes (Signed)
Physical Therapy Treatment Patient Details Name: Kelli Pena MRN: [id number] DOB: 07/08/1920 Today's Date: 07/21/2014    History of Present Illness 80 y.o. female presents with closed, nondisplaced fracture of L proximal tibia shaft .    PT Comments    Patient very happy to be out of bed and very sweet. Will continue with transfer mobility. Continue to recommend SNF for ongoing Physical Therapy.     Follow Up Recommendations  SNF     Equipment Recommendations  None recommended by PT    Recommendations for Other Services       Precautions / Restrictions Precautions Precautions: Fall Restrictions Weight Bearing Restrictions: Yes LLE Weight Bearing: Non weight bearing    Mobility  Bed Mobility Overal bed mobility: Needs Assistance Bed Mobility: Supine to Sit     Supine to sit: Min assist;HOB elevated     General bed mobility comments: Min assist for LLE support off of bed and to scoot forward. VC for technique and heavy use of rail.  Transfers Overall transfer level: Needs assistance Equipment used: Rolling walker (2 wheeled)   Sit to Stand: Mod assist Stand pivot transfers: Mod assist;+2 physical assistance;+2 safety/equipment       General transfer comment: Mod assist for boost and balance to stand.  verbal and tactile cues for hand placement. Mod A assist for pivot transfer as patient NWB on her L. Did not attempt hopping at this time.   Ambulation/Gait                 Stairs            Wheelchair Mobility    Modified Rankin (Stroke Patients Only)       Balance     Sitting balance-Leahy Scale: Fair       Standing balance-Leahy Scale: Zero                      Cognition Arousal/Alertness: Awake/alert Behavior During Therapy: WFL for tasks assessed/performed Overall Cognitive Status: Within Functional Limits for tasks assessed                      Exercises      General Comments        Pertinent  Vitals/Pain Pain Assessment: No/denies pain Pain Location: did not state any pain with movement    Home Living                      Prior Function            PT Goals (current goals can now be found in the care plan section) Progress towards PT goals: Progressing toward goals    Frequency  Min 3X/week    PT Plan Current plan remains appropriate    Co-evaluation             End of Session Equipment Utilized During Treatment: Gait belt Activity Tolerance: Patient tolerated treatment well Patient left: in chair;with call bell/phone within reach     Time: 1033-1056 PT Time Calculation (min): 23 min  Charges:  $Therapeutic Activity: 23-37 mins                    G Codes:      Fredrich Birks 07/21/2014, 12:21 PM 07/21/2014 Fredrich Birks PTA [phone] pager [phone] office

## 2014-07-21 NOTE — Consult Note (Signed)
CARDIOLOGY CONSULT NOTE   Patient ID: Kelli Pena MRN: [id number] DOB/AGE: Aug 20, 1920 80 y.o.  Admit date: 07/16/2014  Primary Physician   [name], MD Primary Cardiologist   New Reason for Consultation   New onset afib with RVR   HPI: Kelli Pena is a 80 y.o. female with a history of hypertension, diabetes mellitus and arthritis who presented to [facility] on 07/17/14 after falling and experiencing a left tibial fracture. Her hospital course has been complicated by SIRS, AMS, a pneumoperitoneum due to a perforated ulcer and new onset afib with RVR. Cardiology was consulted for help with management of atrial fibrillation with RVR.  She is a very lively 80 year old with a remarkably short past medical history. She has no past cardiac problems. She lives alone and walks with a walker. Patient presented with tibial fracture from a mechanical fall trying to transfer from her bed to a bedside commode. She denies lightheadedness or dizziness. She was seen by orthopedics, and placed in a long-leg cast. She was admitted for observation. TRH was consulted for altered mental status found to have SIRS. Her hospital course was further complicated by a pneumoperitoneum due to a perforated ulcer and she is currently NPO. Last night she was noted to go into atrial fibrillation with rates in the 150s. The patient does admit to some SOB; however, she now feels completely back to normal. Her rates are still not well controlled despite IV metoprolol 5mg  TID. She denies a history of chest pain, lightheadedness, dizziness, palpitations or syncope. She has had a couple falls over the past year but feels she is pretty stable. No history of TIA or stroke. No blood in stool or urine.    Past Medical History  Diagnosis Date  . Hypertension   . Diabetes mellitus without complication   . Arthritis   . Hypothyroid   . Fibromyalgia   . Complication of anesthesia     trouble waking up, very cold, hypotension      Past Surgical History  Procedure Laterality Date  . Hip surgery    . Cholecystectomy    . Eye surgery      Allergies  Allergen Reactions  . Tetanus Toxoids Swelling    I have reviewed the patient's current medications . aspirin  300 mg Rectal Daily  . enoxaparin (LOVENOX) injection  30 mg Subcutaneous Q24H  . furosemide  20 mg Intravenous Daily  . levothyroxine  37.5 mcg Intravenous Daily  . metoprolol  5 mg Intravenous 3 times per day  . pantoprazole (PROTONIX) IV  40 mg Intravenous Q12H  . piperacillin-tazobactam (ZOSYN)  IV  3.375 g Intravenous 3 times per day  . pneumococcal 23 valent vaccine  0.5 mL Intramuscular Tomorrow-1000   . dextrose 5 % and 0.45% NaCl 75 mL/hr at 07/20/14 1741   acetaminophen, fentaNYL, promethazine  Prior to Admission medications   Medication Sig Start Date End Date Taking? Authorizing Provider  aspirin 325 MG tablet Take 325 mg by mouth daily with breakfast.   Yes Historical Provider, MD  diclofenac sodium (VOLTAREN) 1 % GEL Apply 4 g topically 4 (four) times daily as needed (for pain).    Yes Historical Provider, MD  DULoxetine (CYMBALTA) 60 MG capsule Take 60 mg by mouth every morning.  12/12/13 12/12/14 Yes Gillian Scarce, MD  furosemide (LASIX) 40 MG tablet Take 40 mg by mouth daily.   Yes Historical Provider, MD  gabapentin (NEURONTIN) 600 MG tablet Take 600 mg  by mouth at bedtime as needed (for pain).    Yes Historical Provider, MD  HYDROcodone-acetaminophen (NORCO/VICODIN) 5-325 MG per tablet Take 1 tablet by mouth every 6 (six) hours as needed for moderate pain.  02/13/13  Yes Mahima Pandey, MD  irbesartan (AVAPRO) 150 MG tablet Take 150 mg by mouth daily.   Yes Historical Provider, MD  levothyroxine (SYNTHROID, LEVOTHROID) 75 MCG tablet Take 75 mcg by mouth daily before breakfast.   Yes Historical Provider, MD  lidocaine (LIDODERM) 5 % Place 3 patches onto the skin daily as needed (for pain). Apply to both knees and right shoulder.  Remove & Discard patch within 12 hours or as directed by MD   Yes Historical Provider, MD  Multiple Vitamin (MULTIVITAMIN) tablet Take 1 tablet by mouth daily.   Yes Historical Provider, MD  piroxicam (FELDENE) 20 MG capsule Take 20 mg by mouth daily. With food   Yes Historical Provider, MD  polyethylene glycol (MIRALAX / GLYCOLAX) packet Take 4-17 g by mouth daily as needed (constipation). 12/28/12  Yes Joseph Art, DO  POTASSIUM PO Take 1 tablet by mouth daily.    Yes Historical Provider, MD  rOPINIRole (REQUIP) 4 MG tablet Take 4 mg by mouth at bedtime.   Yes Historical Provider, MD  sulfamethoxazole-trimethoprim (BACTRIM DS) 800-160 MG per tablet Take 1 tablet by mouth 2 (two) times daily. For 10 days. Started 07/11/14. Last dose should be 07/20/14. 07/11/14  Yes Historical Provider, MD  traMADol (ULTRAM) 50 MG tablet Take 50 mg by mouth every 6 (six) hours as needed for moderate pain.    Yes Historical Provider, MD  Vitamin D, Ergocalciferol, (DRISDOL) 50000 UNITS CAPS capsule Take 50,000 Units by mouth every 7 (seven) days. Saturday. 12/12/13 12/12/14  Gillian Scarce, MD     History   Social History  . Marital Status: Widowed    Spouse Name: N/A    Number of Children: N/A  . Years of Education: 12   Occupational History  . RETIRED     Social History Main Topics  . Smoking status: Former Smoker -- 1.50 packs/day for 20 years    Types: Cigarettes    Quit date: 11/21/1965  . Smokeless tobacco: Never Used  . Alcohol Use: Yes     Comment: Rarely   . Drug Use: No  . Sexual Activity: No   Other Topics Concern  . Not on file   Social History Narrative   Marital Status: Widowed    Children: Daughter Oceanographer)   Pets:  Dog (1)    Living Situation: Lives alone.   Occupation: Retired Training and development officer)    Education: Engineer, agricultural    Tobacco Use/Exposure:  She smoked 1 - 1 1/2 ppd for 20 years. She quit in 1967.    Alcohol Use:  Rarely   Drug Use:  None   Diet:  Regular   Exercise:   None   Hobbies: Playing with Dog/ Taking Picture/ Reading             Family Status  Relation Status Death Age  . Father Deceased   . Mother Deceased   . Daughter Alive    Family History  Problem Relation Age of Onset  . Stroke Father   . Arthritis Sister   . Arthritis Daughter      ROS:  Full 14 point review of systems complete and found to be negative unless listed above.  Physical Exam: Blood pressure 150/65, pulse 136, temperature 98.2 F (36.8  C), temperature source Oral, resp. rate 18, height  (1.499 m), weight 149 lb (67.586 kg), SpO2 97.00%.  General: Well developed, well nourished, NAD, appears stated age  HEENT: NCAT, mucous membranes moist.  Cardiovascular: S1 S2 auscultated, tachycardic, Irregular  Respiratory: Clear to auscultation bilaterally with equal chest rise  Abdomen: Soft, nontender, nondistended, + bowel sounds  Extremities: warm dry without cyanosis clubbing or edema, left lower extremity in long leg cast  Neuro: AAO x3, no focal deficits  Psych: Pleasant mood and affect.  Labs:   Lab Results  Component Value Date   WBC 9.7 07/21/2014   HGB 11.7* 07/21/2014   HCT 35.7* 07/21/2014   MCV 89.9 07/21/2014   PLT 603* 07/21/2014    Recent Labs  07/18/14 1620  INR 1.11    Recent Labs Lab 07/19/14 0850  07/21/14 0620  NA 141  < > 148*  K 4.6  < > 3.9  CL 103  < > 109  CO2 20  < > 22  BUN 29*  < > 35*  CREATININE 1.38*  < > 0.98  CALCIUM 9.1  < > 9.3  PROT 6.3  --   --   BILITOT 0.6  --   --   ALKPHOS 84  --   --   ALT 13  --   --   AST 17  --   --   GLUCOSE 121*  < > 155*  ALBUMIN 2.7*  --   --   < > = values in this interval not displayed. Magnesium  Date Value Ref Range Status  07/21/2014 2.4  1.5 - 2.5 mg/dL Final   TSH  Date/Time Value Ref Range Status  07/18/2014  4:20 PM 1.210  0.350 - 4.500 uIU/mL Final   Vitamin B-12  Date/Time Value Ref Range Status  07/18/2014 12:50 PM 317  211 - 911 pg/mL Final     Performed at  [facility]    Echo: pending  ECG:  afib with RVR HR 124. Non specific ST abnormalities.  Radiology:  Ct Abdomen Pelvis W Contrast  07/20/2014   ADDENDUM REPORT: 07/20/2014 11:50  ADDENDUM: Study discussed by telephone with Dr. Nita Sells MIKHAIL on 07/20/2014 at 1140 hrs.  The patient and her daughter are declining surgical intervention at this time, with improving clinical symptoms since initial presentation. At this time conservative treatment is planned. The patient remains in PO. If the patient remains stable, a contrast swallow (recommend water-soluble contrast) will be obtained in the coming days.   Electronically Signed   By: Augusto Gamble M.D.   On: 07/20/2014 11:50   07/20/2014   CLINICAL DATA:  80 year old female with perforated bowel. Initial encounter.  EXAM: CT ABDOMEN AND PELVIS WITH CONTRAST  TECHNIQUE: Multidetector CT imaging of the abdomen and pelvis was performed using the standard protocol following bolus administration of intravenous contrast.  CONTRAST:  OMNIPAQUE IOHEXOL 300 MG/ML  SOLN  COMPARISON:  Chest CTA 07/18/2014.  FINDINGS: No pericardial or pleural effusion. Improved ventilation at the lung bases with decreased atelectasis.  Osteopenia. Advanced degenerative changes in the spine. Multifactorial lumbar spinal stenosis. Right hip arthroplasty with streak artifact. No acute osseous abnormality identified.  No pelvic free fluid. Diminutive bladder. Grossly negative for age uterus and adnexa.  Negative rectum. Diverticulosis of the sigmoid colon, with no definite active inflammation. Diverticulosis continues in to the left colon and is extensive. Oral contrast appears to of reached the rectum without evidence of obstruction. No definite active  inflammation in the left colon. Diverticulosis of the transverse colon, extensive in the proximal third. No active inflammation identified. Diverticulosis at the hepatic flexure is less pronounced. Negative right colon. No dilated  small bowel identified.  Extensive inflammation about the distal stomach and duodenum bulb (series 2, image 27). Decompressed distal stomach. Confluent pneumoperitoneum over the liver dome, with scattered small areas of pneumoperitoneum elsewhere, including in a small fact containing umbilical hernia. No retroperitoneal gas identified in the abdomen or pelvis. Hyper enhancement of the gastric antrum and duodenum bulb amidst the inflammation. Also, the CBD is enlarged up to 12 mm diameter. There is tapering of the distal CBD, with scattered small calcifications in the pancreatic head and uncinate process superimposed on pancreatic atrophy. No definite obstructing stone or lesion identified at the distal CBD which tapers. The mid and distal duodenum then have a more normal appearance.  There is also a gas in the gallbladder which is decompressed (series 2, image 22).  Liver enhancement within normal limits. Portal venous system within normal limits. Spleen, and adrenal glands are within normal limits. Negative left kidney. Negative right kidney except for a 4 cm upper pole simple cyst and a thickly rim calcified lower pole cystic lesion measuring 21 mm diameter. No renal obstruction. No abdominal free fluid identified. Major arterial structures in the abdomen and pelvis are patent with extensive Aortoiliac calcified atherosclerosis noted.  IMPRESSION: 1. Small volume pneumoperitoneum, most abundant in the right upper quadrant where there is confluent inflammation about the distal stomach and duodenum bulb. The constellation favors a perforated distal gastric or proximal duodenum all ulcer. There is no retroperitoneal gas. 2. Dilated CBD up to 12 mm, and gas within the gallbladder. Has this patient undergone previous sphincterotomy. There is chronic calcific pancreatitis, but no obstructing CBD etiology identified. 3. Extensive diverticulosis throughout the colon, but favor unrelated to the acute perforation.   Electronically Signed: By: Augusto Gamble M.D. On: 07/20/2014 11:14    ASSESSMENT AND PLAN:    Active Problems:   Fall   Unspecified hypothyroidism   Diabetes mellitus   Hypertension   Osteoarthritis   Restless leg   Tibia fracture   Atrial fibrillation   Kelli Pena is a 80 y.o. female with a history of hypertension, diabetes mellitus and arthritis who presented to [facility] on 07/17/14 after falling and experiencing a left tibial fracture. Her hospital course has been complicated by SIRS, AMS, a pneumoperitoneum due to a perforated ulcer and new onset afib with RVR. Cardiology was consulted for help with management of atrial fibrillation with RVR.  New onset atrial fibrillation with RVR  -Patient was noted to have atrial fibrillation last night and placed on IV lopressor  TID which has not helped control her rates. Will give her  IV dilt and then start a gtt at 5- can up titrate as needed. BP has room. -TSH is 1.20,  mag and phosphate level normal -Likely secondary to patient's pneumoperitoneum  - 2D ECHO pending  -Patient has a CHADsVasc 5, but spoke with the daughter regarding anticoagulation. Patient does not appear to be an anticoagulation candidate per IM MD. The patient is very lively and walks with a walker. She has had a couple falls over the past year making her higher risk for long term anticoagulation. For now it would be okay to start IV heparin if cleared by GI. Otherwise would manage conservatively until acute pneumoperitoneum is resolved.   SIRS secondary to unknown the source   -Possibly  secondary to perforated colon   Pneumoperitoneum, Perforated ulcer  -Unknown etiology  -CTA of the chest showed free intraperitoneal air  -Patient's daughter wished to try conservative management with antibiotics and pain medications as needed  -Continue Zosyn and IVF, NPO status  -CT abd/pelvis: Small volume pneumoperitoneum, most abundant in the right upper quadrant where there is  confluent inflammation about the distal stomach and duodenum bulb, likely perforated distal gastric or proximal duodenum all ulcer.  - General surgery following. No surgery as per family and patient wishes. May autograft?   Hypertension - slightly elevated at 150/65. Adding dilt load and infusion for afib with RVR -Losartan held due to n.p.o. Status  -Continue Lasix, 20 mg IV daily   Altered mental status  -Resolved  -Secondary to unknown cause at this time, possibly secondary to narcotics vs pneumoperitoneum  -Vitamin B12 317, ammonia 19, TSH 1.2 (all within normal limits)   Left tibia fracture  -Followed by orthopedics, currently in cast   Hypothyroidism  -Continue Synthroid 37.5 mcg IV daily   History of Diabetes mellitus, type II  -Currently on no home medications, possibly diet controlled   Possibly Acute kidney injury, on chronic kidney disease  - Resolved  - Creat 0.98 today. GFR 49.  SignedAllena Katz 07/21/2014 12:52 PM  Pager [phone]  Co-Sign MD  I have examined the patient and reviewed assessment and plan and discussed with patient.  Agree with above as stated.  She needs to be on a diltiazem gtt for rate control.  No signs of heart failure at this time but her uncontrolled heart rate could lead to acute diastolic heart failure.  I assume heparin would be contraindicated with a perforated ulcer.  She is not a good longterm candidate or anticoagulation given falls risk and ulcers.   Will follow.  Cindel Daugherty S.

## 2014-07-21 NOTE — Progress Notes (Addendum)
[facility]                                                                              Patient Demographics  Kelli Pena, is a 80 y.o. female, DOB - 08-30-1920, WUJ:[id number]  Admit date - 07/16/2014   Admitting Physician Toni Arthurs, MD  Outpatient Primary MD for the patient is [name], MD  LOS - 5   Chief Complaint  Patient presents with  . Fall  . Leg Pain      HPI/Consult  80 year old female with past medical history of hypertension, diabetes mellitus, arthritis, who presented after falling and experiencing a left tibial fracture. Patient was seen by orthopedics, and placed in a long-leg cast. She was admitted for observation. TRH was called tilted for altered mental status. Patient currently does not recall why she is in the hospital and is not oriented to place or time. At the time of evaluation, she was arousable however very lethargic. Patient could not answer all questions and could only follow some commands.   Assessment & Plan  SIRS secondary to unknown the source -Leukocytosis improving, patient afebrile -Chest x-ray shows atelectasis in the lung bases, no infiltration  -Abdominal x-ray shows no acute abnormality  -UA from 07/17/2014 and 8/29 negative for infection -Blood cultures show no growth to date -Possibly secondary to perforated colon  New onset atrial fibrillation with RVR -Patient was noted to have atrial fibrillation last night -TSH is 1.20 -Will continue to monitor telemetry -Will obtain magnesium and phosphate level -Likely secondary to patient's pneumoperitoneum -Will start patient on IV metoprolol -Will obtain echocardiogram and consult cardiology -Patient has a CHADsVasc 5, but spoke with the daughter regarding anticoagulation.  Patient does not appear to be an anticoagulation candidate.  Pneumoperitoneum, Perforated ulcer -Unknown etiology -CTA of the chest showed free intraperitoneal air -General surgery consulted and  appreciated -Patient's daughter wished to try conservative management with antibiotics and pain medications as needed -Continue Zosyn and IVF, NPO status -CT abd/pelvis: Small volume pneumoperitoneum, most abundant in the right upper quadrant where there is confluent inflammation about the distal stomach and duodenum bulb, likely perforated distal gastric or proximal duodenum all ulcer. -Spoke with general surgery, at this point, no surgery as per family and patient wishes.  May autograft?  May benefit from Upper GI swallow within a day or so.  Altered mental status  -Resolved -Secondary to unknown cause at this time, possibly secondary to narcotics vs pneumoperitoneum  -Vitamin B12 317, ammonia 19, TSH 1.2 (all within normal limits)  Left tibia fracture  -Followed by orthopedics, currently in cast   Hypothyroidism -Continue Synthroid 37.5 mcg IV daily   Restless leg syndrome -Requip held due to n.p.o. status  Depression -Cymbalta held due to to n.p.o. status  Hypertension -Losartan held due to n.p.o. Status -Continue Lasix, 20 mg IV daily -Will add on medications if needed  History of Diabetes mellitus, type II -Currently on no home medications, possibly diet controlled -Will add on medications if needed  Possibly Acute kidney injury, on chronic kidney disease -Resolved -Patient's baseline on 0.6 however in May of 2015 and creatinine is 1.4 -Currently 0.98 -Will continue to monitor  Code Status:  Full  Family Communication: None at bedside  Disposition Plan: Admitted  Time Spent in minutes   30 minutes  Procedures  None  Consults   General surgery Orthopedic surgery  DVT Prophylaxis  Lovenox  Lab Results  Component Value Date   PLT 603* 07/21/2014    Medications  Scheduled Meds: . aspirin  300 mg Rectal Daily  . enoxaparin (LOVENOX) injection  30 mg Subcutaneous Q24H  . furosemide  20 mg Intravenous Daily  . levothyroxine  37.5 mcg Intravenous Daily    . pantoprazole (PROTONIX) IV  40 mg Intravenous Q12H  . piperacillin-tazobactam (ZOSYN)  IV  3.375 g Intravenous 3 times per day  . pneumococcal 23 valent vaccine  0.5 mL Intramuscular Tomorrow-1000   Continuous Infusions: . dextrose 5 % and 0.45% NaCl 75 mL/hr at 07/20/14 1741   PRN Meds:.acetaminophen, fentaNYL, promethazine  Antibiotics    Anti-infectives   Start     Dose/Rate Route Frequency Ordered Stop   07/18/14 1400  piperacillin-tazobactam (ZOSYN) IVPB 3.375 g     3.375 g 12.5 mL/hr over 240 Minutes Intravenous 3 times per day 07/18/14 1228     07/17/14 1000  sulfamethoxazole-trimethoprim (BACTRIM DS) 800-160 MG per tablet 1 tablet  Status:  Discontinued     1 tablet Oral Daily 07/17/14 0819 07/18/14 1228        Subjective:   Dawanda Mapel seen and examined today. Patient stated she is no longer having abdominal pain.  She is glad that she is able to use a sponge in her month.  She has no other complaints and denies chest pain, shortness of breath, dizziness, headache.  Objective:   Filed Vitals:   07/20/14 1521 07/20/14 2100 07/21/14 0504 07/21/14 0539  BP: 151/92 136/101 157/102 150/65  Pulse: 116 112 136   Temp: 98.5 F (36.9 C) 98.5 F (36.9 C) 98.2 F (36.8 C)   TempSrc: Oral Oral Oral   Resp: 18 18 18    Height:      Weight:      SpO2: 99% 99% 97%     Wt Readings from Last 3 Encounters:  07/16/14 67.586 kg (149 lb)  07/17/13 67.586 kg (149 lb)  03/21/13 68.312 kg (150 lb 9.6 oz)     Intake/Output Summary (Last 24 hours) at 07/21/14 1019 Last data filed at 07/20/14 2158  Gross per 24 hour  Intake      0 ml  Output      0 ml  Net      0 ml    Exam General: Well developed, well nourished, NAD, appears stated age  HEENT: NCAT, mucous membranes moist.  Cardiovascular: S1 S2 auscultated, tachycardic, Irregular  Respiratory: Clear to auscultation bilaterally with equal chest rise  Abdomen: Soft, nontender, nondistended, + bowel sounds   Extremities: warm dry without cyanosis clubbing or edema, left lower extremity in long leg cast  Neuro: AAO x3, no focal deficits Psych: Pleasant mood and affect.   Data Review   Micro Results Recent Results (from the past 240 hour(s))  CULTURE, BLOOD (ROUTINE X 2)     Status: None   Collection Time    07/18/14  9:07 AM      Result Value Ref Range Status   Specimen Description BLOOD RIGHT HAND   Final   Special Requests BOTTLES DRAWN AEROBIC AND ANAEROBIC 10CC   Final   Culture  Setup Time     Final   Value: 07/18/2014 12:45     Performed at [facility]   Culture     Final   Value:        BLOOD CULTURE RECEIVED NO GROWTH TO DATE CULTURE WILL BE HELD FOR 5 DAYS BEFORE ISSUING A FINAL NEGATIVE REPORT     Performed at [facility]   Report Status PENDING   Incomplete  CULTURE, BLOOD (ROUTINE X 2)     Status: None   Collection Time    07/18/14  9:20 AM      Result Value Ref Range Status   Specimen Description BLOOD LEFT HAND   Final   Special Requests     Final   Value: BOTTLES DRAWN AEROBIC AND ANAEROBIC 10CC BLUE, 5CC RED   Culture  Setup Time     Final   Value: 07/18/2014 12:45     Performed at [facility]   Culture     Final   Value:        BLOOD CULTURE RECEIVED NO GROWTH TO DATE CULTURE WILL BE HELD FOR 5 DAYS BEFORE ISSUING A FINAL NEGATIVE REPORT     Performed at [facility]   Report Status PENDING   Incomplete  URINE CULTURE     Status: None   Collection Time    07/18/14  8:30 PM      Result Value Ref Range Status   Specimen Description URINE, CLEAN CATCH   Final   Special Requests NONE   Final   Culture  Setup Time     Final   Value: 07/18/2014 00:38     Performed at [facility] Count     Final   Value: 3,000 COLONIES/ML     Performed at [facility]   Culture     Final   Value: INSIGNIFICANT GROWTH     Performed at [facility]   Report Status 07/20/2014 FINAL   Final    Radiology  Reports Dg Chest 2 View  07/16/2014   CLINICAL DATA:  Larey Seat this morning.  EXAM: CHEST  2 VIEW  COMPARISON:  07/22/2005.  FINDINGS: Normal sized heart. Clear lungs. Thoracic spine degenerative changes. 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 No fracture or pneumothorax.  IMPRESSION: No acute abnormality.   Electronically Signed   By: Gordan Payment M.D.   On: 07/16/2014 23:34   Dg Pelvis 1-2 Views  07/16/2014   CLINICAL DATA:  Fall  EXAM: PELVIS - 1-2 VIEW  COMPARISON:  Prior radiograph from 02/01/2011  FINDINGS: Right total hip arthroplasty in place. The acetabular and femoral components articulate normally with 1 another. No acute fracture or dislocation. Bony pelvis is intact. SI joints are approximated. No pubic dye stasis.  Prominent degenerative changes noted within the lower lumbar spine.  No soft tissue abnormality.  Osteopenia noted. Vascular calcifications present within the upper thighs.  IMPRESSION: 1. No acute fracture or dislocation. 2. Right hip arthroplasty in place without evidence of hardware complication.   Electronically Signed   By: Rise Mu M.D.   On: 07/16/2014 23:34   Dg Femur Left  07/16/2014   CLINICAL DATA:  Larey Seat at 6 a.m. this morning.  Left lower leg pain.  EXAM: LEFT FEMUR - 2 VIEW  COMPARISON:  None.  FINDINGS: There is no evidence of fracture or other focal bone lesions. Soft tissues are unremarkable. Prominent degenerative changes in the left knee. Vascular calcifications.  IMPRESSION: No acute bony abnormalities.   Electronically Signed   By: Chrissie Noa  Andria Meuse M.D.   On: 07/16/2014 23:31   Dg Tibia/fibula Left  07/16/2014   CLINICAL DATA:  Left lower leg pain after a fall at 6 a.m. this morning.  EXAM: LEFT TIBIA AND FIBULA - 2 VIEW  COMPARISON:  12/31/2013  FINDINGS: Acute mostly transverse fracture of the proximal/ mid shaft of the left tibia without significant displacement. Marked degenerative changes in the left  knee. Left fibula appears intact.  IMPRESSION: Fracture of the proximal/ mid shaft of the left tibia without significant displacement.   Electronically Signed   By: Burman Nieves M.D.   On: 07/16/2014 23:32   Ct Angio Chest Pe W/cm &/or Wo Cm  07/18/2014   CLINICAL DATA:  Recent left lower extremity surgery. Tachycardia. Immobilization. Evaluate for pulmonary embolism.  EXAM: CT ANGIOGRAPHY CHEST WITH CONTRAST  TECHNIQUE: Multidetector CT imaging of the chest was performed using the standard protocol during bolus administration of intravenous contrast. Multiplanar CT image reconstructions and MIPs were obtained to evaluate the vascular anatomy.  CONTRAST:  70mL OMNIPAQUE IOHEXOL 350 MG/ML SOLN  COMPARISON:  Chest radiograph 07/18/2014 ; chest CT 07/22/2005  FINDINGS: Motion artifact limits evaluation for the assessment of pulmonary embolism. No evidence for central, main, lobar or segmental pulmonary embolism. Evaluation of the distal segmental and subsegmental pulmonary arteries limited due to motion artifact.  No enlarged axillary, mediastinal or hilar lymphadenopathy. Sub cm right peritracheal lymph node, stable dating back to 2006 heart is enlarged. No pericardial effusion. Coronary arterial calcifications. Aorta and main pulmonary artery are normal in caliber.  Central airways are patent. Subpleural ground-glass and consolidative opacities within the bilateral lower lobes. Additionally there are scattered patchy ground-glass opacities. There is a 1.5 cm focal area ground-glass within the right upper lobe (image 71; series 47). 5 mm subpleural ground-glass opacity within the left lower lobe (image 41; series 406). Probable trace bilateral pleural effusions.  Limited visualization of the upper abdomen demonstrates free intraperitoneal air. Atrophy of the pancreas. Small hiatal hernia.  Multilevel degenerative change of the thoracic spine.  Review of the MIP images confirms the above findings.  IMPRESSION:  1. Free intraperitoneal air is demonstrated within the upper abdomen. Recommend correlation with abdominal pelvic CT as this is concerning for perforation. 2. Evaluation for pulmonary embolism is limited secondary to motion artifact. No evidence for central, main, lobar or proximal segmental pulmonary emboli. Evaluation of the distal segmental and subsegmental pulmonary arteries are limited due to motion artifact. 3. Bilateral lower lobe ground-glass and subpleural consolidative opacities favored represents atelectasis. Additionally scattered ground-glass pulmonary opacities 4. More focal 1.5 cm area of ground-glass attenuation within the right upper lobe. Follow-up chest CT in 3 months is recommended to assess for persistence. Critical Value/emergent results were called by telephone at the time of interpretation on 07/18/2014 at 11:27 am to Dr. Victorino Dike, who verbally acknowledged these results.   Electronically Signed   By: Annia Belt M.D.   On: 07/18/2014 11:27   Dg Chest Port 1 View  07/18/2014   CLINICAL DATA:  Fever.  Abnormal labs.  EXAM: PORTABLE CHEST - 1 VIEW  COMPARISON:  07/16/2014  FINDINGS: Shallow inspiration with atelectasis in the lung bases. Heart size and pulmonary vascularity appear normal. No focal consolidation in the lungs. No blunting of costophrenic angles. No pneumothorax. Calcification of the aorta. Degenerative changes in the spine and shoulders.  IMPRESSION: Shallow inspiration with atelectasis in the lung bases appear   Electronically Signed   By: Burman Nieves M.D.   On:  07/18/2014 06:45   Dg Abd Portable 1v  07/17/2014   CLINICAL DATA:  Abdominal pain and vomiting.  EXAM: PORTABLE ABDOMEN - 1 VIEW  COMPARISON:  02/01/2011 and pelvis radiograph dated 07/16/2014.  FINDINGS: Normal bowel gas pattern. Stable 2.2 cm oval calcification in the region of the lower pole of the right kidney. Stable right hip prosthesis. Thoracolumbar spine degenerative changes and scoliosis.  IMPRESSION:  No acute abnormality.   Electronically Signed   By: Gordan Payment M.D.   On: 07/17/2014 23:04    CBC  Recent Labs Lab 07/17/14 0012 07/18/14 0437 07/19/14 0850 07/20/14 0530 07/21/14 0620  WBC 9.4 17.0* 15.2* 12.1* 9.7  HGB 11.5* 13.1 13.1 12.3 11.7*  HCT 34.5* 40.0 39.6 37.8 35.7*  PLT 387 452* 484* 504* 603*  MCV 90.1 90.3 89.6 92.2 89.9  MCH 30.0 29.6 29.6 30.0 29.5  MCHC 33.3 32.8 33.1 32.5 32.8  RDW 14.5 14.6 14.7 14.9 14.7  LYMPHSABS 1.0  --   --   --   --   MONOABS 0.7  --   --   --   --   EOSABS 0.0  --   --   --   --   BASOSABS 0.0  --   --   --   --     Chemistries   Recent Labs Lab 07/17/14 0012 07/18/14 0437 07/19/14 0850 07/20/14 0530 07/21/14 0620  NA 137 139 141 142 148*  K 4.8 5.1 4.6 4.0 3.9  CL 99 102 103 104 109  CO2 GLUCOSE 135* 123* 121* 140* 155*  BUN 17 22 29* 37* 35*  CREATININE 1.21* 1.28* 1.38* 1.23* 0.98  CALCIUM 9.5 9.2 9.1 9.4 9.3  MG  --   --   --   --  2.4  AST 20  --  17  --   --   ALT 14  --  13  --   --   ALKPHOS 92  --  84  --   --   BILITOT 0.4  --  0.6  --   --    ------------------------------------------------------------------------------------------------------------------ estimated creatinine clearance is 30.6 ml/min (by C-G formula based on Cr of 0.98). ------------------------------------------------------------------------------------------------------------------ No results found for this basename: HGBA1C,  in the last 72 hours ------------------------------------------------------------------------------------------------------------------ No results found for this basename: CHOL, HDL, LDLCALC, TRIG, CHOLHDL, LDLDIRECT,  in the last 72 hours ------------------------------------------------------------------------------------------------------------------  Recent Labs  07/18/14 1620  TSH 1.210    ------------------------------------------------------------------------------------------------------------------  Recent Labs  07/18/14 1250  VITAMINB12 317    Coagulation profile  Recent Labs Lab 07/18/14 1620  INR 1.11    No results found for this basename: DDIMER,  in the last 72 hours  Cardiac Enzymes No results found for this basename: CK, CKMB, TROPONINI, MYOGLOBIN,  in the last 168 hours ------------------------------------------------------------------------------------------------------------------ No components found with this basename: POCBNP,     Mehreen Azizi D.O. on 07/21/2014 at 10:19 AM  Between 7am to 7pm - Pager - [phone]  After 7pm go to [email or web address] - password [email or web address]  And look for the night coverage person covering for me after hours  [facility] Office  [phone]

## 2014-07-21 NOTE — Progress Notes (Signed)
Subjective: Kelli Pena reports feeling better today compared to this weekend. She does not have recollection of the events on Friday, 8/28. However, she states that she remembers orthopedics putting a cast on her leg last week before he altered mental status. Today she is able to explain where she is and what is happening. She states that she was able to sleep better last night and has no c/o abdominal pain this morning. She denies any HA, CP, SOB, N, V, fever or chills. She has been NPO, but has been wetting her mouth with sponges.   Objective: Vital signs in last 24 hours: Temp:  [98.2 F (36.8 C)-98.5 F (36.9 C)] 98.2 F (36.8 C) (08/31 0504) Pulse Rate:  [112-136] 136 (08/31 0504) Resp:  [18] 18 (08/31 0504) BP: (136-157)/(65-102) 150/65 mmHg (08/31 0539) SpO2:  [97 %-99 %] 97 % (08/31 0504)  Intake/Output from previous day:   Intake/Output this shift:     Recent Labs  07/19/14 0850 07/20/14 0530  HGB 13.1 12.3    Recent Labs  07/19/14 0850 07/20/14 0530  WBC 15.2* 12.1*  RBC 4.42 4.10  HCT 39.6 37.8  PLT 484* 504*    Recent Labs  07/19/14 0850 07/20/14 0530  NA 141 142  K 4.6 4.0  CL 103 104  CO2 20 19  BUN 29* 37*  CREATININE 1.38* 1.23*  GLUCOSE 121* 140*  CALCIUM 9.1 9.4    Recent Labs  07/18/14 1620  INR 1.11    WD/WN elderly female in nad. A and O x4. Mood and affect are appropriate. EOMI. Respiration normal and unlabored with [place]. LLE in long leg cast. NV intact bilaterally with brisk capillary refill. 5/5 strength of toe extensors and flexors bilaterally. No lower exremity swelling noted. No palpable cords. No lymphadenopathy.   Assessment/Plan: L proximal tibia shaft fracture: non-op treatment, long-leg cast on 8/27. NWB LLE, up with PT when appropriate given other medical concerns. Continue elevation of LLE. Plan for eventual SNF placement. On lovenox for DVT prophylaxis   Pneumoperitoneum: likely secondary to perforated colon. Had CT  abdomen-pelvis on 8/31. She and her daughter prefer non-op treatment at this time.On IV Zosyn. Afebrile with WBC trending down, still tachycardic. Treatment recs per IM and surgery teams.     Kelli Pena 07/21/2014, 7:28 AM

## 2014-07-22 ENCOUNTER — Inpatient Hospital Stay (HOSPITAL_COMMUNITY): Payer: Medicare Other

## 2014-07-22 ENCOUNTER — Ambulatory Visit: Payer: Medicare Other | Admitting: Physician Assistant

## 2014-07-22 DIAGNOSIS — K5900 Constipation, unspecified: Secondary | ICD-10-CM

## 2014-07-22 LAB — BASIC METABOLIC PANEL
ANION GAP: 15 (ref 5–15)
BUN: 32 mg/dL — AB (ref 6–23)
CALCIUM: 9.3 mg/dL (ref 8.4–10.5)
CO2: 25 meq/L (ref 19–32)
CREATININE: 0.96 mg/dL (ref 0.50–1.10)
Chloride: 106 mEq/L (ref 96–112)
GFR calc Af Amer: 58 mL/min — ABNORMAL LOW (ref >90)
GFR calc non Af Amer: 50 mL/min — ABNORMAL LOW (ref >90)
GLUCOSE: 156 mg/dL — AB (ref 70–99)
Potassium: 2.8 mEq/L — CL (ref 3.7–5.3)
Sodium: 146 mEq/L (ref 137–147)

## 2014-07-22 LAB — CBC
HCT: 37.4 % (ref 36.0–46.0)
HEMOGLOBIN: 12.3 g/dL (ref 12.0–15.0)
MCH: 29.4 pg (ref 26.0–34.0)
MCHC: 32.9 g/dL (ref 30.0–36.0)
MCV: 89.3 fL (ref 78.0–100.0)
Platelets: 594 10*3/uL — ABNORMAL HIGH (ref 150–400)
RBC: 4.19 MIL/uL (ref 3.87–5.11)
RDW: 14.5 % (ref 11.5–15.5)
WBC: 9 10*3/uL (ref 4.0–10.5)

## 2014-07-22 MED ORDER — SODIUM CHLORIDE 0.9 % IJ SOLN
10.0000 mL | INTRAMUSCULAR | Status: DC | PRN
  Administered 2014-07-23: 10 mL
  Administered 2014-07-25 – 2014-07-28 (×4): 20 mL

## 2014-07-22 MED ORDER — IOHEXOL 300 MG/ML  SOLN
150.0000 mL | Freq: Once | INTRAMUSCULAR | Status: AC | PRN
  Administered 2014-07-22: 75 mL via ORAL

## 2014-07-22 MED ORDER — POTASSIUM CHLORIDE 10 MEQ/100ML IV SOLN
10.0000 meq | INTRAVENOUS | Status: AC
  Administered 2014-07-22 – 2014-07-23 (×6): 10 meq via INTRAVENOUS
  Filled 2014-07-22 (×10): qty 100

## 2014-07-22 NOTE — Progress Notes (Signed)
[facility]                                                                              Patient Demographics  Kelli Pena, is a 80 y.o. female, DOB - 1919-12-06, WUJ:[id number]  Admit date - 07/16/2014   Admitting Physician Toni Arthurs, MD  Outpatient Primary MD for the patient is Kelli Jubilee, MD  LOS - 6   Chief Complaint  Patient presents with  . Fall  . Leg Pain      HPI/Consult  80 year old female with past medical history of hypertension, diabetes mellitus, arthritis, who presented after falling and experiencing a left tibial fracture. Patient was seen by orthopedics, and placed in a long-leg cast. She was admitted for observation. TRH was called tilted for altered mental status. Patient currently does not recall why she is in the hospital and is not oriented to place or time. At the time of evaluation, she was arousable however very lethargic. Patient could not answer all questions and could only follow some commands.  Interim history Patient developed perforation, either duodenal or gastric ulcer. Will order upper GI with water-soluble contrast to evaluate. Currently patient is n.p.o. Patient developed atrial fibrillation with RVR. And was started on a Cardizem drip by cardiology. Patient and daughter are in agreement with no invasive procedures including surgery. Patient started to not feel that she should be taking anticoagulation for atrial fibrillation.  Assessment & Plan  Sepsis secondary to perforated ulcer -Leukocytosis resolved, patient afebrile -Chest x-ray shows atelectasis in the lung bases, no infiltration  -Abdominal x-ray shows no acute abnormality  -UA from 07/17/2014 and 8/29 negative for infection -Blood cultures show no growth to date  New onset atrial fibrillation with RVR -Patient appears to have converted -TSH is 1.20 -Magnesium 2.4, potassium 2.8  -Likely secondary to patient's pneumoperitoneum -Echocardiogram pending -Cardiology  consulted and appreciated, started patient on Cardizem drip -Patient has a CHADsVasc 5, but spoke with the daughter regarding anticoagulation.  Patient does not appear to be an anticoagulation candidate due to recent falls.  Pneumoperitoneum, Perforated ulcer -Unknown etiology -CTA of the chest showed free intraperitoneal air -General surgery consulted and appreciated -Patient's daughter wished to try conservative management with antibiotics and pain medications as needed -Continue Zosyn and IVF, NPO status -CT abd/pelvis: Small volume pneumoperitoneum, most abundant in the right upper quadrant where there is confluent inflammation about the distal stomach and duodenum bulb, likely perforated distal gastric or proximal duodenum all ulcer. -Spoke with general surgery, at this point, no surgery as per family and patient wishes.  May autograft?  May benefit from Upper GI swallow  -Will obtain Upper GI with watersoluble contrast  Altered mental status  -Resolved -Secondary to unknown cause at this time, possibly secondary to narcotics vs pneumoperitoneum  -Vitamin B12 317, ammonia 19, TSH 1.2 (all within normal limits)  Left tibia fracture  -Followed by orthopedics, currently in cast   Hypothyroidism -Continue Synthroid 37.5 mcg IV daily   Restless leg syndrome -Requip held due to n.p.o. status  Depression -Cymbalta held due to to n.p.o. status  Hypertension -Losartan held due to n.p.o. Status -Continue Lasix, 20 mg IV daily -Will add on medications if  needed  History of Diabetes mellitus, type II -Currently on no home medications, possibly diet controlled -Will add on medications if needed  Possibly Acute kidney injury, on chronic kidney disease -Resolved -Patient's baseline on 0.6 however in May of 2015 and creatinine is 1.4 -Currently 0.98 -Will continue to monitor  Hypokalemia -Potassium 2.8, magnesium 2.4 -Will replace and continue to monitor BMP  Code Status:  Full  Family Communication: None at bedside, daughter via phone  Disposition Plan: Admitted  Time Spent in minutes   30 minutes  Procedures  Echocardiogram  Consults   General surgery Orthopedic surgery  DVT Prophylaxis  Lovenox  Lab Results  Component Value Date   PLT 594* 07/22/2014    Medications  Scheduled Meds: . aspirin  300 mg Rectal Daily  . enoxaparin (LOVENOX) injection  30 mg Subcutaneous Q24H  . furosemide  20 mg Intravenous Daily  . levothyroxine  37.5 mcg Intravenous Daily  . pantoprazole (PROTONIX) IV  40 mg Intravenous Q12H  . piperacillin-tazobactam (ZOSYN)  IV  3.375 g Intravenous 3 times per day  . pneumococcal 23 valent vaccine  0.5 mL Intramuscular Tomorrow-1000  . potassium chloride  10 mEq Intravenous Q1 Hr x 6   Continuous Infusions: . dextrose 5 % and 0.45% NaCl 75 mL/hr at 07/21/14 2236  . diltiazem (CARDIZEM) infusion 10 mg/hr (07/22/14 0007)   PRN Meds:.acetaminophen, fentaNYL, promethazine  Antibiotics    Anti-infectives   Start     Dose/Rate Route Frequency Ordered Stop   07/18/14 1400  piperacillin-tazobactam (ZOSYN) IVPB 3.375 g     3.375 g 12.5 mL/hr over 240 Minutes Intravenous 3 times per day 07/18/14 1228     07/17/14 1000  sulfamethoxazole-trimethoprim (BACTRIM DS) 800-160 MG per tablet 1 tablet  Status:  Discontinued     1 tablet Oral Daily 07/17/14 0819 07/18/14 1228        Subjective:   Raaga Maeder seen and examined today. Patient stated she is no longer having abdominal pain.  She is glad that she is able to use a sponge with small amounts of water in her month.  She has no other complaints and denies chest pain, shortness of breath, dizziness, headache.  Objective:   Filed Vitals:   07/21/14 1353 07/21/14 1600 07/21/14 2021 07/22/14 0422  BP: 151/69 133/63 139/67 141/67  Pulse: 123 132 108 106  Temp: 99 F (37.2 C) 98.5 F (36.9 C) 98.5 F (36.9 C) 98.3 F (36.8 C)  TempSrc: Oral Oral Oral Oral  Resp: Height:      Weight:      SpO2: 97% 99% 96% 98%    Wt Readings from Last 3 Encounters:  07/16/14 67.586 kg (149 lb)  07/17/13 67.586 kg (149 lb)  03/21/13 68.312 kg (150 lb 9.6 oz)     Intake/Output Summary (Last 24 hours) at 07/22/14 1013 Last data filed at 07/21/14 1700  Gross per 24 hour  Intake      0 ml  Output      0 ml  Net      0 ml    Exam General: Well developed, well nourished, NAD, appears stated age  HEENT: NCAT, mucous membranes moist.  Cardiovascular: S1 S2 auscultated, regular Respiratory: Clear to auscultation bilaterally with equal chest rise  Abdomen: Soft, nontender, nondistended, + bowel sounds  Extremities: warm dry without cyanosis clubbing or edema, left lower extremity in long leg cast  Neuro: AAO x3, no focal deficits Psych: Pleasant mood  and affect.   Data Review   Micro Results Recent Results (from the past 240 hour(s))  CULTURE, BLOOD (ROUTINE X 2)     Status: None   Collection Time    07/18/14  9:07 AM      Result Value Ref Range Status   Specimen Description BLOOD RIGHT HAND   Final   Special Requests BOTTLES DRAWN AEROBIC AND ANAEROBIC 10CC   Final   Culture  Setup Time     Final   Value: 07/18/2014 12:45     Performed at [facility]   Culture     Final   Value:        BLOOD CULTURE RECEIVED NO GROWTH TO DATE CULTURE WILL BE HELD FOR 5 DAYS BEFORE ISSUING A FINAL NEGATIVE REPORT     Performed at [facility]   Report Status PENDING   Incomplete  CULTURE, BLOOD (ROUTINE X 2)     Status: None   Collection Time    07/18/14  9:20 AM      Result Value Ref Range Status   Specimen Description BLOOD LEFT HAND   Final   Special Requests     Final   Value: BOTTLES DRAWN AEROBIC AND ANAEROBIC 10CC BLUE, 5CC RED   Culture  Setup Time     Final   Value: 07/18/2014 12:45     Performed at [facility]   Culture     Final   Value:        BLOOD CULTURE RECEIVED NO GROWTH TO DATE CULTURE WILL BE HELD FOR 5  DAYS BEFORE ISSUING A FINAL NEGATIVE REPORT     Performed at [facility]   Report Status PENDING   Incomplete  URINE CULTURE     Status: None   Collection Time    07/18/14  8:30 PM      Result Value Ref Range Status   Specimen Description URINE, CLEAN CATCH   Final   Special Requests NONE   Final   Culture  Setup Time     Final   Value: 07/18/2014 00:38     Performed at [facility] Count     Final   Value: 3,000 COLONIES/ML     Performed at [facility]   Culture     Final   Value: INSIGNIFICANT GROWTH     Performed at [facility]   Report Status 07/20/2014 FINAL   Final    Radiology Reports Dg Chest 2 View  07/16/2014   CLINICAL DATA:  Larey Seat this morning.  EXAM: CHEST  2 VIEW  COMPARISON:  07/22/2005.  FINDINGS: Normal sized heart. Clear lungs. Thoracic spine degenerative changes. 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 No fracture or pneumothorax.  IMPRESSION: No acute abnormality.   Electronically Signed   By: Gordan Payment M.D.   On: 07/16/2014 23:34   Dg Pelvis 1-2 Views  07/16/2014   CLINICAL DATA:  Fall  EXAM: PELVIS - 1-2 VIEW  COMPARISON:  Prior radiograph from 02/01/2011  FINDINGS: Right total hip arthroplasty in place. The acetabular and femoral components articulate normally with 1 another. No acute fracture or dislocation. Bony pelvis is intact. SI joints are approximated. No pubic dye stasis.  Prominent degenerative changes noted within the lower lumbar spine.  No soft tissue abnormality.  Osteopenia noted. Vascular calcifications present within the upper thighs.  IMPRESSION: 1. No acute fracture or dislocation. 2. Right hip arthroplasty in  place without evidence of hardware complication.   Electronically Signed   By: Rise Mu M.D.   On: 07/16/2014 23:34   Dg Femur Left  07/16/2014   CLINICAL DATA:  Larey Seat at 6 a.m. this morning.  Left lower leg pain.  EXAM: LEFT FEMUR - 2 VIEW   COMPARISON:  None.  FINDINGS: There is no evidence of fracture or other focal bone lesions. Soft tissues are unremarkable. Prominent degenerative changes in the left knee. Vascular calcifications.  IMPRESSION: No acute bony abnormalities.   Electronically Signed   By: Burman Nieves M.D.   On: 07/16/2014 23:31   Dg Tibia/fibula Left  07/16/2014   CLINICAL DATA:  Left lower leg pain after a fall at 6 a.m. this morning.  EXAM: LEFT TIBIA AND FIBULA - 2 VIEW  COMPARISON:  12/31/2013  FINDINGS: Acute mostly transverse fracture of the proximal/ mid shaft of the left tibia without significant displacement. Marked degenerative changes in the left knee. Left fibula appears intact.  IMPRESSION: Fracture of the proximal/ mid shaft of the left tibia without significant displacement.   Electronically Signed   By: Burman Nieves M.D.   On: 07/16/2014 23:32   Ct Angio Chest Pe W/cm &/or Wo Cm  07/18/2014   CLINICAL DATA:  Recent left lower extremity surgery. Tachycardia. Immobilization. Evaluate for pulmonary embolism.  EXAM: CT ANGIOGRAPHY CHEST WITH CONTRAST  TECHNIQUE: Multidetector CT imaging of the chest was performed using the standard protocol during bolus administration of intravenous contrast. Multiplanar CT image reconstructions and MIPs were obtained to evaluate the vascular anatomy.  CONTRAST:  70mL OMNIPAQUE IOHEXOL 350 MG/ML SOLN  COMPARISON:  Chest radiograph 07/18/2014 ; chest CT 07/22/2005  FINDINGS: Motion artifact limits evaluation for the assessment of pulmonary embolism. No evidence for central, main, lobar or segmental pulmonary embolism. Evaluation of the distal segmental and subsegmental pulmonary arteries limited due to motion artifact.  No enlarged axillary, mediastinal or hilar lymphadenopathy. Sub cm right peritracheal lymph node, stable dating back to 2006 heart is enlarged. No pericardial effusion. Coronary arterial calcifications. Aorta and main pulmonary artery are normal in caliber.   Central airways are patent. Subpleural ground-glass and consolidative opacities within the bilateral lower lobes. Additionally there are scattered patchy ground-glass opacities. There is a 1.5 cm focal area ground-glass within the right upper lobe (image 71; series 47). 5 mm subpleural ground-glass opacity within the left lower lobe (image 41; series 406). Probable trace bilateral pleural effusions.  Limited visualization of the upper abdomen demonstrates free intraperitoneal air. Atrophy of the pancreas. Small hiatal hernia.  Multilevel degenerative change of the thoracic spine.  Review of the MIP images confirms the above findings.  IMPRESSION: 1. Free intraperitoneal air is demonstrated within the upper abdomen. Recommend correlation with abdominal pelvic CT as this is concerning for perforation. 2. Evaluation for pulmonary embolism is limited secondary to motion artifact. No evidence for central, main, lobar or proximal segmental pulmonary emboli. Evaluation of the distal segmental and subsegmental pulmonary arteries are limited due to motion artifact. 3. Bilateral lower lobe ground-glass and subpleural consolidative opacities favored represents atelectasis. Additionally scattered ground-glass pulmonary opacities 4. More focal 1.5 cm area of ground-glass attenuation within the right upper lobe. Follow-up chest CT in 3 months is recommended to assess for persistence. Critical Value/emergent results were called by telephone at the time of interpretation on 07/18/2014 at 11:27 am to Dr. Victorino Dike, who verbally acknowledged these results.   Electronically Signed   By: Annia Belt M.D.   On:  07/18/2014 11:27   Dg Chest Port 1 View  07/18/2014   CLINICAL DATA:  Fever.  Abnormal labs.  EXAM: PORTABLE CHEST - 1 VIEW  COMPARISON:  07/16/2014  FINDINGS: Shallow inspiration with atelectasis in the lung bases. Heart size and pulmonary vascularity appear normal. No focal consolidation in the lungs. No blunting of costophrenic  angles. No pneumothorax. Calcification of the aorta. Degenerative changes in the spine and shoulders.  IMPRESSION: Shallow inspiration with atelectasis in the lung bases appear   Electronically Signed   By: Burman Nieves M.D.   On: 07/18/2014 06:45   Dg Abd Portable 1v  07/17/2014   CLINICAL DATA:  Abdominal pain and vomiting.  EXAM: PORTABLE ABDOMEN - 1 VIEW  COMPARISON:  02/01/2011 and pelvis radiograph dated 07/16/2014.  FINDINGS: Normal bowel gas pattern. Stable 2.2 cm oval calcification in the region of the lower pole of the right kidney. Stable right hip prosthesis. Thoracolumbar spine degenerative changes and scoliosis.  IMPRESSION: No acute abnormality.   Electronically Signed   By: Gordan Payment M.D.   On: 07/17/2014 23:04    CBC  Recent Labs Lab 07/17/14 0012 07/18/14 0437 07/19/14 0850 07/20/14 0530 07/21/14 0620 07/22/14 0352  WBC 9.4 17.0* 15.2* 12.1* 9.7 9.0  HGB 11.5* 13.1 13.1 12.3 11.7* 12.3  HCT 34.5* 40.0 39.6 37.8 35.7* 37.4  PLT 387 452* 484* 504* 603* 594*  MCV 90.1 90.3 89.6 92.2 89.9 89.3  MCH 30.0 29.6 29.6 30.0 29.5 29.4  MCHC 33.3 32.8 33.1 32.5 32.8 32.9  RDW 14.5 14.6 14.7 14.9 14.7 14.5  LYMPHSABS 1.0  --   --   --   --   --   MONOABS 0.7  --   --   --   --   --   EOSABS 0.0  --   --   --   --   --   BASOSABS 0.0  --   --   --   --   --     Chemistries   Recent Labs Lab 07/17/14 0012 07/18/14 0437 07/19/14 0850 07/20/14 0530 07/21/14 0620 07/22/14 0352  NA 137 139 141 142 148* 146  K 4.8 5.1 4.6 4.0 3.9 2.8*  CL 99 102 103 104 109 106  CO2 26 23 20 19 22 25   GLUCOSE 135* 123* 121* 140* 155* 156*  BUN 17 22 29* 37* 35* 32*  CREATININE 1.21* 1.28* 1.38* 1.23* 0.98 0.96  CALCIUM 9.5 9.2 9.1 9.4 9.3 9.3  MG  --   --   --   --  2.4  --   AST 20  --  17  --   --   --   ALT 14  --  13  --   --   --   ALKPHOS 92  --  84  --   --   --   BILITOT 0.4  --  0.6  --   --   --     ------------------------------------------------------------------------------------------------------------------ estimated creatinine clearance is 31.3 ml/min (by C-G formula based on Cr of 0.96). ------------------------------------------------------------------------------------------------------------------ No results found for this basename: HGBA1C,  in the last 72 hours ------------------------------------------------------------------------------------------------------------------ No results found for this basename: CHOL, HDL, LDLCALC, TRIG, CHOLHDL, LDLDIRECT,  in the last 72 hours ------------------------------------------------------------------------------------------------------------------ No results found for this basename: TSH, T4TOTAL, FREET3, T3FREE, THYROIDAB,  in the last 72 hours ------------------------------------------------------------------------------------------------------------------ No results found for this basename: VITAMINB12, FOLATE, FERRITIN, TIBC, IRON, RETICCTPCT,  in the last 72 hours  Coagulation profile  Recent Labs Lab 07/18/14 1620  INR 1.11    No results found for this basename: DDIMER,  in the last 72 hours  Cardiac Enzymes No results found for this basename: CK, CKMB, TROPONINI, MYOGLOBIN,  in the last 168 hours ------------------------------------------------------------------------------------------------------------------ No components found with this basename: POCBNP,     Fleta Borgeson D.O. on 07/22/2014 at 10:13 AM  Between 7am to 7pm - Pager - [phone]  After 7pm go to [email or web address] - password [email or web address]  And look for the night coverage person covering for me after hours  [facility] Office  [phone]

## 2014-07-22 NOTE — Progress Notes (Signed)
Peripherally Inserted Central Catheter/Midline Placement  The IV Nurse has discussed with the patient and/or persons authorized to consent for the patient, the purpose of this procedure and the potential benefits and risks involved with this procedure.  The benefits include less needle sticks, lab draws from the catheter and patient may be discharged home with the catheter.  Risks include, but not limited to, infection, bleeding, blood clot (thrombus formation), and puncture of an artery; nerve damage and irregular heat beat.  Alternatives to this procedure were also discussed.  PICC/Midline Placement Documentation  PICC / Midline Double Lumen 07/22/14 PICC Right Basilic 39 cm 0 cm (Active)  Indication for Insertion or Continuance of Line Poor Vasculature-patient has had multiple peripheral attempts or PIVs lasting less than 24 hours 07/22/2014  9:00 PM  Exposed Catheter (cm) 0 cm 07/22/2014  9:00 PM  Site Assessment Clean;Dry;Intact 07/22/2014  9:00 PM  Lumen #1 Status Flushed;Saline locked;Blood return noted 07/22/2014  9:00 PM  Lumen #2 Status Flushed;Saline locked;Blood return noted 07/22/2014  9:00 PM  Dressing Type Transparent 07/22/2014  9:00 PM  Dressing Status Clean;Dry;Intact;Antimicrobial disc in place 07/22/2014  9:00 PM  Dressing Change Due 07/29/14 07/22/2014  9:00 PM       Cameo Shewell, Lajean Manes 07/22/2014, 9:46 PM

## 2014-07-22 NOTE — Clinical Documentation Improvement (Signed)
  Chart states "altered mental status". In the Coding world this term is considered nonspecific and low weighted. If possible, please clarify this term to better illustrate this patient's severity of illness and risk of mortality. Thank you.  Possible Clinical Conditions?  - Encephalopathy (describe type if known)                       Septic                       Metabolic                       Toxic - Drug induced delirium  - Other Condition  Supporting Information: - Traumatic fracture tibia - Perforated ulcer gastric/duodeal with Sepsis  Thank You, Beverley Fiedler ,RN Clinical Documentation Specialist:  [phone]  [facility]- Health Information Management

## 2014-07-22 NOTE — Clinical Social Work Note (Signed)
Pt transferred from 5N to 2W.  5N CSW updated covering 2W CSW regarding transfer and disposition needs.  Pt agreeable to SNF and will be discharged to [facility], SNF once medically appropriate.  CSW updated [facility] SNF regarding pt transfer.  Eber Jones at [facility] aware and will complete admission's paperwork with pt/family prior to dc.  [name], LCSWA [phone]  Psychiatric & Orthopedics (5N 1-16) Clinical Social Worker

## 2014-07-22 NOTE — Care Management Note (Signed)
    Page 1 of 1   07/28/2014     10:28:59 AM CARE MANAGEMENT NOTE 07/28/2014  Patient:  Kelli Pena, Kelli Pena   Account Number:  [id number]  Date Initiated:  07/17/2014  Documentation initiated by:  Vance Peper  Subjective/Objective Assessment:   80 yr old female admitted with left tib/fib closed non displaced fracture s/p fall. non operative.     Action/Plan:   Case manager spoke with patient concerning need for home health. Choice offered. Referral called to Tamala Bari, [facility] liaison. CM will continue to monitor   Anticipated DC Date:     Anticipated DC Plan:  LONG TERM ACUTE CARE (LTAC)      DC Planning Services  CM consult      Choice offered to / List presented to:  C-1 Patient           Status of service:  In process, will continue to follow Medicare Important Message given?  YES (If response is "NO", the following Medicare IM given date fields will be blank) Date Medicare IM given:  07/24/2014 Medicare IM given by:  MAYO,HENRIETTA Date Additional Medicare IM given:  07/28/2014 Additional Medicare IM given by:  [facility] Kierria Feigenbaum  Discharge Disposition:    Per UR Regulation:  Reviewed for med. necessity/level of care/duration of stay  If discussed at Long Length of Stay Meetings, dates discussed:    Comments:  07-28-14 10:30am Avie Arenas, RNBSN [phone] Plan for discharge 9-8 to SNF.  07/24/14 0905 Henrietta Mayo RN MSN BSN CCM Transferred to 2C 2/2 low O2 sats, brief period of unresponsiveness.  O2 sats now 100% on 4L/min [place].  07/22/14 Sidney Ace, RN, BSN [phone] LTAC consult ordered by MD.  [facility] and [facility] have evaluations in process.  Spoke with pt's daughter, Francesca Jewett, regarding LTAC transfer, and she is quite favorable of this.  She would be interested in [facility], as it is in the hospital.  I will arrange a tour for her in am with [facility] liasion, should bed be offered.  Will follow up.  07/17/14  1501 Vance Peper, RN BSN Case Manager Patient  states she lives home alone, but has neighbors on either side and her daughter and son-in-law come to check on her. Case manager will speak with daughter concerning patient's overnight care.

## 2014-07-22 NOTE — Progress Notes (Signed)
Subjective: Ms. Artiaga reports that she feels good today. Her daughter is at the bedside. She reports no pain into her LLE. She has no new complaints at this time. She is thankful for the wet sponges. She denies any new HA, CP, SOB, N, V, fever or chills.  She and her daughter report that despite HR into the 150s overnight, she has not reported any palpitations or CP. She has been NWB on the LLE, but has been working with PT for transfers into a chair, etc. She is eager to go to a rehab facility when appropriate.   Objective: Vital signs in last 24 hours: Temp:  [98.3 F (36.8 C)-99.3 F (37.4 C)] 99.3 F (37.4 C) (09/01 1400) Pulse Rate:  [99-108] 99 (09/01 1400) Resp:  [18] 18 (09/01 1400) BP: (122-141)/(67-78) 122/78 mmHg (09/01 1400) SpO2:  [96 %-99 %] 99 % (09/01 1400)  Intake/Output from previous day:   Intake/Output this shift: Total I/O In: 1080 [I.V.:980; IV Piggyback:100] Out: -    Recent Labs  07/20/14 0530 07/21/14 0620 07/22/14 0352  HGB 12.3 11.7* 12.3    Recent Labs  07/21/14 0620 07/22/14 0352  WBC 9.7 9.0  RBC 3.97 4.19  HCT 35.7* 37.4  PLT 603* 594*    Recent Labs  07/21/14 0620 07/22/14 0352  NA 148* 146  K 3.9 2.8*  CL 109 106  CO2 22 25  BUN 35* 32*  CREATININE 0.98 0.96  GLUCOSE 155* 156*  CALCIUM 9.3 9.3   No results found for this basename: LABPT, INR,  in the last 72 hours  WD/WN elderly female in nad. A and O x4. Mood and affect are appropriate. EOMI. Respiration normal and unlabored with [place]. LLE in long leg cast. NV intact bilaterally with brisk capillary refill. 5/5 strength of toe extensors and flexors bilaterally. No lower exremity swelling noted. No palpable cords. No lymphadenopathy.   Assessment/Plan: L proximal tibia shaft fracture- non-op treatment, long-leg cast on 8/27. NWB LLE, up with PT when appropriate given other medical concerns. Continue elevation of LLE. Plan for eventual SNF placement at [facility] place. On lovenox  for DVT prophylaxis. D/C per IM team when medically stable.   New onset A fib with RVR- on IV metoprolol per cardiology. Hemodynamically stable with HR 90s and BP 120s/80s.Treatment per cardiology and IM team.   Pneumoperitoneum: likely secondary to perforated colon. Had CT abdomen and UGI W/ contrast 9/1, awaiting final reads. She and her daughter prefer non-op treatment at this time.On IV Zosyn. Afebrile with WBC trending down. Treatment recs per IM and surgery teams.     Jesusmanuel Erbes HOWELLS 07/22/2014, 5:04 PM

## 2014-07-22 NOTE — Progress Notes (Signed)
Patient Name: Kelli Pena Date of Encounter: 07/22/2014     Active Problems:   Fall   Unspecified hypothyroidism   Diabetes mellitus   Hypertension   Osteoarthritis   Restless leg   Tibia fracture   Atrial fibrillation    SUBJECTIVE  Feeling well .  No CP or SOB.   CURRENT MEDS . aspirin  300 mg Rectal Daily  . enoxaparin (LOVENOX) injection  30 mg Subcutaneous Q24H  . furosemide  20 mg Intravenous Daily  . levothyroxine  37.5 mcg Intravenous Daily  . pantoprazole (PROTONIX) IV  40 mg Intravenous Q12H  . piperacillin-tazobactam (ZOSYN)  IV  3.375 g Intravenous 3 times per day  . pneumococcal 23 valent vaccine  0.5 mL Intramuscular Tomorrow-1000  . potassium chloride  10 mEq Intravenous Q1 Hr x 6    OBJECTIVE  Filed Vitals:   07/21/14 1353 07/21/14 1600 07/21/14 2021 07/22/14 0422  BP: 151/69 133/63 139/67 141/67  Pulse: 123 132 108 106  Temp: 99 F (37.2 C) 98.5 F (36.9 C) 98.5 F (36.9 C) 98.3 F (36.8 C)  TempSrc: Oral Oral Oral Oral  Resp: Height:      Weight:      SpO2: 97% 99% 96% 98%    Intake/Output Summary (Last 24 hours) at 07/22/14 0906 Last data filed at 07/21/14 1700  Gross per 24 hour  Intake      0 ml  Output      0 ml  Net      0 ml   Filed Weights   07/16/14 2107  Weight: 149 lb (67.586 kg)    PHYSICAL EXAM  General: Pleasant, NAD. Appears younger than her stated age.  Neuro: Alert and oriented X 3. Moves all extremities spontaneously. Psych: Normal affect. HEENT:  Normal  Neck: Supple without bruits or JVD. Lungs:  Resp regular and unlabored, CTA. Heart: irreg irreg. no s3, s4, or murmurs. Abdomen: Soft, non-tender, non-distended, BS + x 4.  Extremities: No clubbing, cyanosis or edema. DP/PT/Radials 2+ and equal bilaterally.  Accessory Clinical Findings  CBC  Recent Labs  07/21/14 0620 07/22/14 0352  WBC 9.7 9.0  HGB 11.7* 12.3  HCT 35.7* 37.4  MCV 89.9 89.3  PLT 603* 594*   Basic Metabolic  Panel  Recent Labs  16/10/96 0530 07/21/14 0620 07/22/14 0352  NA 142 148* 146  K 4.0 3.9 2.8*  CL 104 109 106  CO2 GLUCOSE 140* 155* 156*  BUN 37* 35* 32*  CREATININE 1.23* 0.98 0.96  CALCIUM 9.4 9.3 9.3  MG  --  2.4  --   PHOS  --  3.2  --     TELE  afib with RVR rates in low 100-110s. Freq missed beats. Longest pause was 1.8 seconds  Radiology/Studies  Dg Chest 2 View  07/16/2014   CLINICAL DATA:  Larey Seat this morning.  EXAM: CHEST  2 VIEW  COMPARISON:  07/22/2005.  FINDINGS: Normal sized heart. Clear lungs. Thoracic spine degenerative changes. 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 No fracture or pneumothorax.  IMPRESSION: No acute abnormality.   Electronically Signed   By: Gordan Payment M.D.   On: 07/16/2014 23:34   Dg Pelvis 1-2 Views  07/16/2014   CLINICAL DATA:  Fall  EXAM: PELVIS - 1-2 VIEW  COMPARISON:  Prior radiograph from 02/01/2011  FINDINGS: Right total hip arthroplasty in place. The acetabular and femoral components articulate normally with  1 another. No acute fracture or dislocation. Bony pelvis is intact. SI joints are approximated. No pubic dye stasis.  Prominent degenerative changes noted within the lower lumbar spine.  No soft tissue abnormality.  Osteopenia noted. Vascular calcifications present within the upper thighs.  IMPRESSION: 1. No acute fracture or dislocation. 2. Right hip arthroplasty in place without evidence of hardware complication.   Electronically Signed   By: Rise Mu M.D.   On: 07/16/2014 23:34   Dg Femur Left  07/16/2014   CLINICAL DATA:  Larey Seat at 6 a.m. this morning.  Left lower leg pain.  EXAM: LEFT FEMUR - 2 VIEW  COMPARISON:  None.  FINDINGS: There is no evidence of fracture or other focal bone lesions. Soft tissues are unremarkable. Prominent degenerative changes in the left knee. Vascular calcifications.  IMPRESSION: No acute bony abnormalities.   Electronically Signed   By:  Burman Nieves M.D.   On: 07/16/2014 23:31   Dg Tibia/fibula Left  07/16/2014   CLINICAL DATA:  Left lower leg pain after a fall at 6 a.m. this morning.  EXAM: LEFT TIBIA AND FIBULA - 2 VIEW  COMPARISON:  12/31/2013  FINDINGS: Acute mostly transverse fracture of the proximal/ mid shaft of the left tibia without significant displacement. Marked degenerative changes in the left knee. Left fibula appears intact.  IMPRESSION: Fracture of the proximal/ mid shaft of the left tibia without significant displacement.   Electronically Signed   By: Burman Nieves M.D.   On: 07/16/2014 23:32   Ct Angio Chest Pe W/cm &/or Wo Cm  07/18/2014   CLINICAL DATA:  Recent left lower extremity surgery. Tachycardia. Immobilization. Evaluate for pulmonary embolism.  EXAM: CT ANGIOGRAPHY CHEST WITH CONTRAST  TECHNIQUE: Multidetector CT imaging of the chest was performed using the standard protocol during bolus administration of intravenous contrast. Multiplanar CT image reconstructions and MIPs were obtained to evaluate the vascular anatomy.  CONTRAST:  70mL OMNIPAQUE IOHEXOL 350 MG/ML SOLN  COMPARISON:  Chest radiograph 07/18/2014 ; chest CT 07/22/2005  FINDINGS: Motion artifact limits evaluation for the assessment of pulmonary embolism. No evidence for central, main, lobar or segmental pulmonary embolism. Evaluation of the distal segmental and subsegmental pulmonary arteries limited due to motion artifact.  No enlarged axillary, mediastinal or hilar lymphadenopathy. Sub cm right peritracheal lymph node, stable dating back to 2006 heart is enlarged. No pericardial effusion. Coronary arterial calcifications. Aorta and main pulmonary artery are normal in caliber.  Central airways are patent. Subpleural ground-glass and consolidative opacities within the bilateral lower lobes. Additionally there are scattered patchy ground-glass opacities. There is a 1.5 cm focal area ground-glass within the right upper lobe (image 71; series 47). 5  mm subpleural ground-glass opacity within the left lower lobe (image 41; series 406). Probable trace bilateral pleural effusions.  Limited visualization of the upper abdomen demonstrates free intraperitoneal air. Atrophy of the pancreas. Small hiatal hernia.  Multilevel degenerative change of the thoracic spine.  Review of the MIP images confirms the above findings.  IMPRESSION: 1. Free intraperitoneal air is demonstrated within the upper abdomen. Recommend correlation with abdominal pelvic CT as this is concerning for perforation. 2. Evaluation for pulmonary embolism is limited secondary to motion artifact. No evidence for central, main, lobar or proximal segmental pulmonary emboli. Evaluation of the distal segmental and subsegmental pulmonary arteries are limited due to motion artifact. 3. Bilateral lower lobe ground-glass and subpleural consolidative opacities favored represents atelectasis. Additionally scattered ground-glass pulmonary opacities 4. More focal 1.5 cm area of ground-glass  attenuation within the right upper lobe. Follow-up chest CT in 3 months is recommended to assess for persistence. Critical Value/emergent results were called by telephone at the time of interpretation on 07/18/2014 at 11:27 am to Dr. Victorino Dike, who verbally acknowledged these results.   Electronically Signed   By: Annia Belt M.D.   On: 07/18/2014 11:27   Ct Abdomen Pelvis W Contrast  07/20/2014   ADDENDUM REPORT: 07/20/2014 11:50  ADDENDUM: Study discussed by telephone with Dr. Nita Sells MIKHAIL on 07/20/2014 at 1140 hrs.  The patient and her daughter are declining surgical intervention at this time, with improving clinical symptoms since initial presentation. At this time conservative treatment is planned. The patient remains in PO. If the patient remains stable, a contrast swallow (recommend water-soluble contrast) will be obtained in the coming days.   Electronically Signed   By: Augusto Gamble M.D.   On: 07/20/2014 11:50    07/20/2014   CLINICAL DATA:  80 year old female with perforated bowel. Initial encounter.  EXAM: CT ABDOMEN AND PELVIS WITH CONTRAST  TECHNIQUE: Multidetector CT imaging of the abdomen and pelvis was performed using the standard protocol following bolus administration of intravenous contrast.  CONTRAST:  OMNIPAQUE IOHEXOL 300 MG/ML  SOLN  COMPARISON:  Chest CTA 07/18/2014.  FINDINGS: No pericardial or pleural effusion. Improved ventilation at the lung bases with decreased atelectasis.  Osteopenia. Advanced degenerative changes in the spine. Multifactorial lumbar spinal stenosis. Right hip arthroplasty with streak artifact. No acute osseous abnormality identified.  No pelvic free fluid. Diminutive bladder. Grossly negative for age uterus and adnexa.  Negative rectum. Diverticulosis of the sigmoid colon, with no definite active inflammation. Diverticulosis continues in to the left colon and is extensive. Oral contrast appears to of reached the rectum without evidence of obstruction. No definite active inflammation in the left colon. Diverticulosis of the transverse colon, extensive in the proximal third. No active inflammation identified. Diverticulosis at the hepatic flexure is less pronounced. Negative right colon. No dilated small bowel identified.  Extensive inflammation about the distal stomach and duodenum bulb (series 2, image 27). Decompressed distal stomach. Confluent pneumoperitoneum over the liver dome, with scattered small areas of pneumoperitoneum elsewhere, including in a small fact containing umbilical hernia. No retroperitoneal gas identified in the abdomen or pelvis. Hyper enhancement of the gastric antrum and duodenum bulb amidst the inflammation. Also, the CBD is enlarged up to 12 mm diameter. There is tapering of the distal CBD, with scattered small calcifications in the pancreatic head and uncinate process superimposed on pancreatic atrophy. No definite obstructing stone or lesion  identified at the distal CBD which tapers. The mid and distal duodenum then have a more normal appearance.  There is also a gas in the gallbladder which is decompressed (series 2, image 22).  Liver enhancement within normal limits. Portal venous system within normal limits. Spleen, and adrenal glands are within normal limits. Negative left kidney. Negative right kidney except for a 4 cm upper pole simple cyst and a thickly rim calcified lower pole cystic lesion measuring 21 mm diameter. No renal obstruction. No abdominal free fluid identified. Major arterial structures in the abdomen and pelvis are patent with extensive Aortoiliac calcified atherosclerosis noted.  IMPRESSION: 1. Small volume pneumoperitoneum, most abundant in the right upper quadrant where there is confluent inflammation about the distal stomach and duodenum bulb. The constellation favors a perforated distal gastric or proximal duodenum all ulcer. There is no retroperitoneal gas. 2. Dilated CBD up to 12 mm, and gas within the  gallbladder. Has this patient undergone previous sphincterotomy. There is chronic calcific pancreatitis, but no obstructing CBD etiology identified. 3. Extensive diverticulosis throughout the colon, but favor unrelated to the acute perforation.  Electronically Signed: By: Augusto Gamble M.D. On: 07/20/2014 11:14   Dg Chest Port 1 View  07/18/2014   CLINICAL DATA:  Fever.  Abnormal labs.  EXAM: PORTABLE CHEST - 1 VIEW  COMPARISON:  07/16/2014  FINDINGS: Shallow inspiration with atelectasis in the lung bases. Heart size and pulmonary vascularity appear normal. No focal consolidation in the lungs. No blunting of costophrenic angles. No pneumothorax. Calcification of the aorta. Degenerative changes in the spine and shoulders.  IMPRESSION: Shallow inspiration with atelectasis in the lung bases appear   Electronically Signed   By: Burman Nieves M.D.   On: 07/18/2014 06:45   Dg Abd Portable 1v  07/17/2014   CLINICAL DATA:   Abdominal pain and vomiting.  EXAM: PORTABLE ABDOMEN - 1 VIEW  COMPARISON:  02/01/2011 and pelvis radiograph dated 07/16/2014.  FINDINGS: Normal bowel gas pattern. Stable 2.2 cm oval calcification in the region of the lower pole of the right kidney. Stable right hip prosthesis. Thoracolumbar spine degenerative changes and scoliosis.  IMPRESSION: No acute abnormality.   Electronically Signed   By: Gordan Payment M.D.   On: 07/17/2014 23:04    ASSESSMENT AND PLAN  Kelli Pena is a 80 y.o. female with a history of hypertension, diabetes mellitus and arthritis who presented to [facility] on 07/17/14 after falling and experiencing a left tibial fracture. Her hospital course has been complicated by SIRS, AMS, a pneumoperitoneum due to a perforated ulcer and new onset afib with RVR. Cardiology was consulted for help with management of atrial fibrillation with RVR.  New onset atrial fibrillation with RVR  - Started on IV dilt load and infusion yesterday and transferred to telemetry for titration. Telemetry with afib with RVR rates in low 100-110s. Freq missed beats. Longest pause was 1.8 seconds. Asymptomatic.  -TSH is 1.20 - 2D ECHO pending  - Assume heparin would be contraindicated with a perforated ulcer. She is not a good longterm candidate or anticoagulation given falls risk and ulcers. Will follow.  SIRS secondary to unknown the source  -Possibly secondary to perforated colon   Pneumoperitoneum, Perforated ulcer  -Unknown etiology  -CTA of the chest showed free intraperitoneal air  -Patient's daughter wished to try conservative management with antibiotics and pain medications as needed  - NPO status   Hypertension  -Losartan held due to n.p.o. Status  -Continue Lasix, 20 mg IV daily   Left tibia fracture  -Followed by orthopedics, currently in cast   Hypothyroidism  -Continue Synthroid 37.5 mcg IV daily   History of Diabetes mellitus, type II  -Currently on no home medications, possibly diet  controlled   Hypokalemia- K 2.8. Supplementation per IM.    Billy Fischer PA-C  Pager [phone]   Patient seen and examined. Agree with assessment and plan. AF rate better controlled on iv diltiazem. No chest pain. No signs of CHF. Needs K supplement, Mg 2.4. Echo pending.   Lennette Bihari, MD, [facility] 07/22/2014 11:57 AM

## 2014-07-23 ENCOUNTER — Encounter (HOSPITAL_COMMUNITY): Payer: Self-pay | Admitting: Physician Assistant

## 2014-07-23 ENCOUNTER — Inpatient Hospital Stay (HOSPITAL_COMMUNITY): Payer: Medicare Other

## 2014-07-23 DIAGNOSIS — I059 Rheumatic mitral valve disease, unspecified: Secondary | ICD-10-CM

## 2014-07-23 DIAGNOSIS — K921 Melena: Secondary | ICD-10-CM

## 2014-07-23 LAB — BASIC METABOLIC PANEL
Anion gap: 9 (ref 5–15)
BUN: 29 mg/dL — AB (ref 6–23)
CO2: 27 mEq/L (ref 19–32)
Calcium: 8.4 mg/dL (ref 8.4–10.5)
Chloride: 109 mEq/L (ref 96–112)
Creatinine, Ser: 0.95 mg/dL (ref 0.50–1.10)
GFR calc Af Amer: 58 mL/min — ABNORMAL LOW (ref >90)
GFR calc non Af Amer: 50 mL/min — ABNORMAL LOW (ref >90)
GLUCOSE: 190 mg/dL — AB (ref 70–99)
POTASSIUM: 3 meq/L — AB (ref 3.7–5.3)
Sodium: 145 mEq/L (ref 137–147)

## 2014-07-23 LAB — CBC
HEMATOCRIT: 27.6 % — AB (ref 36.0–46.0)
Hemoglobin: 9.1 g/dL — ABNORMAL LOW (ref 12.0–15.0)
MCH: 30 pg (ref 26.0–34.0)
MCHC: 33 g/dL (ref 30.0–36.0)
MCV: 91.1 fL (ref 78.0–100.0)
Platelets: 514 10*3/uL — ABNORMAL HIGH (ref 150–400)
RBC: 3.03 MIL/uL — ABNORMAL LOW (ref 3.87–5.11)
RDW: 14.3 % (ref 11.5–15.5)
WBC: 8.6 10*3/uL (ref 4.0–10.5)

## 2014-07-23 LAB — OCCULT BLOOD X 1 CARD TO LAB, STOOL: FECAL OCCULT BLD: POSITIVE — AB

## 2014-07-23 LAB — HEMOGLOBIN AND HEMATOCRIT, BLOOD
HEMATOCRIT: 30.2 % — AB (ref 36.0–46.0)
HEMATOCRIT: 24.1 % — AB (ref 36.0–46.0)
HEMOGLOBIN: 7.9 g/dL — AB (ref 12.0–15.0)
Hemoglobin: 9.8 g/dL — ABNORMAL LOW (ref 12.0–15.0)

## 2014-07-23 LAB — GLUCOSE, CAPILLARY: Glucose-Capillary: 174 mg/dL — ABNORMAL HIGH (ref 70–99)

## 2014-07-23 LAB — MAGNESIUM: Magnesium: 2 mg/dL (ref 1.5–2.5)

## 2014-07-23 LAB — ABO/RH: ABO/RH(D): A POS

## 2014-07-23 LAB — CLOSTRIDIUM DIFFICILE BY PCR: Toxigenic C. Difficile by PCR: NEGATIVE

## 2014-07-23 MED ORDER — POTASSIUM CHLORIDE 10 MEQ/100ML IV SOLN
10.0000 meq | INTRAVENOUS | Status: AC
  Administered 2014-07-23 (×4): 10 meq via INTRAVENOUS
  Filled 2014-07-23 (×4): qty 100

## 2014-07-23 MED ORDER — CETYLPYRIDINIUM CHLORIDE 0.05 % MT LIQD
7.0000 mL | Freq: Two times a day (BID) | OROMUCOSAL | Status: DC
  Administered 2014-07-23 – 2014-07-28 (×11): 7 mL via OROMUCOSAL

## 2014-07-23 NOTE — Clinical Social Work Note (Signed)
Clinical Social Worker continuing to follow patient and family for support and discharge planning needs.  Per previous CSW documentation, patient has been offered a bed at [facility] and patient has accepted.  CSW spoke with CM who states that LTAC referral has been made due to continued medical complications.  CSW remains available for support and to assist with patient discharge needs once medically stable.  Macario Golds, [place] [phone]

## 2014-07-23 NOTE — Progress Notes (Signed)
[facility] PROGRESS NOTE  REIKO VINJE ZOX:[id number] DOB: 01/24/1920 [name]: 07/16/2014 PCP: [name], MD Interim summary: This is a 80 yo white female with a history of HTN and DM who has been followed by ortho for several weeks with a left tibia stress fracture. She fell in her bathroom 2 days ago and had severe left leg pain. She came to [facility] and was found to have a nondisplaced left tibia fracture. She had a long cast placed and was admitted for observation for altered mental status. She was found to have intraperitoneal free air, possibly secondary to perforated duodenum or gastric ulcer. Surgical intervention on hold in view of the patient's age and overall state, and she is being treated conservatively with zosyn and NPO. On the night of 9/2 patient developed bright red blood per rectum and her hemoglobin dropped to 9 from the baseline of 11.    Assessment/Plan: Sepsis secondary to perforated duodenal or gastric ulcer: on IV antibiotics. Leukocytosis resolved, patient afebrile .Chest x-ray shows atelectasis in the lung bases, no infiltration .Abdominal x-ray shows no acute abnormality. UA from 07/17/2014 and 8/29 negative for infection. Blood cultures show no growth to date.pt completed 5 days of zosyn, plan to stop the antibiotics in 1 to 2 days. Upper GI series show duodenal diverticulum.   New onset Afib with RVR: rate better controlled. On low dose of diltiazem gtt , plan to wean her off the gtt soon today. Aspirin and lovenox on hold for acute GI bleed.   Bright Red Blood per Rectum: new started overnight. Anticoagulation on hold. Aspirin and lovenox discontinued. IV PPI, NPO, GI consulted. H&H q6 hrs and transfuse to keep H&H >8.   Acute encephalopathy:  An episode of confusion and unresponsiveness earlier today. Resolved in a few minutes. Borderline bp measurements, neuro exam benign. No focal deficits, no facial droop, speech normal. CT head without contrast ordered.    Left tibia fracture: further management as per orthopedics.   Hypothyroidism resume synthroid Restless leg syndrome.  hypertension Diabetes Mellitus type 2 Acute on CKD Hypokalemia Anemia   Code Status: full code.  Family Communication: discussed with daughter over the phone Disposition Plan: pending further investigation.    Consultants:  Surgery  Orthopedics.   Cardiology  gastroenterology  Procedures:    Antibiotics:  zosyn  HPI/Subjective: Alert and talkative.   Objective: Filed Vitals:   07/23/14 0452  BP: 139/85  Pulse: 102  Temp: 98.5 F (36.9 C)  Resp: 18    Intake/Output Summary (Last 24 hours) at 07/23/14 0923 Last data filed at 07/23/14 0906  Gross per 24 hour  Intake   1080 ml  Output    150 ml  Net    930 ml   Filed Weights   07/16/14 2107  Weight: 67.586 kg (149 lb)    Exam:   General:  Alert afebrile comfortable  Cardiovascular: s1s2  Respiratory: ctab  Abdomen: soft NT nd bs+  Musculoskeletal: no pedal edema  Data Reviewed: Basic Metabolic Panel:  Recent Labs Lab 07/19/14 0850 07/20/14 0530 07/21/14 0620 07/22/14 0352 07/23/14 0515  NA 141 142 148* 146 145  K 4.6 4.0 3.9 2.8* 3.0*  CL 103 104 109 106 109  CO2 GLUCOSE 121* 140* 155* 156* 190*  BUN 29* 37* 35* 32* 29*  CREATININE 1.38* 1.23* 0.98 0.96 0.95  CALCIUM 9.1 9.4 9.3 9.3 8.4  MG  --   --  2.4  --   --  PHOS  --   --  3.2  --   --    Liver Function Tests:  Recent Labs Lab 07/17/14 0012 07/19/14 0850  AST 20 17  ALT 14 13  ALKPHOS 92 84  BILITOT 0.4 0.6  PROT 6.2 6.3  ALBUMIN 3.2* 2.7*   No results found for this basename: LIPASE, AMYLASE,  in the last 168 hours  Recent Labs Lab 07/18/14 1620  AMMONIA 19   CBC:  Recent Labs Lab 07/17/14 0012  07/19/14 0850 07/20/14 0530 07/21/14 0620 07/22/14 0352 07/23/14 0515  WBC 9.4  < > 15.2* 12.1* 9.7 9.0 8.6  NEUTROABS 7.6  --   --   --   --   --   --   HGB  11.5*  < > 13.1 12.3 11.7* 12.3 9.1*  HCT 34.5*  < > 39.6 37.8 35.7* 37.4 27.6*  MCV 90.1  < > 89.6 92.2 89.9 89.3 91.1  PLT 387  < > 484* 504* 603* 594* 514*  < > = values in this interval not displayed. Cardiac Enzymes: No results found for this basename: CKTOTAL, CKMB, CKMBINDEX, TROPONINI,  in the last 168 hours BNP (last 3 results) No results found for this basename: PROBNP,  in the last 8760 hours CBG:  Recent Labs Lab 07/19/14 0634 07/19/14 2102 07/20/14 0646 07/20/14 2149 07/21/14 0620  GLUCAP 119* 130* 132* 174* 141*    Recent Results (from the past 240 hour(s))  CULTURE, BLOOD (ROUTINE X 2)     Status: None   Collection Time    07/18/14  9:07 AM      Result Value Ref Range Status   Specimen Description BLOOD RIGHT HAND   Final   Special Requests BOTTLES DRAWN AEROBIC AND ANAEROBIC 10CC   Final   Culture  Setup Time     Final   Value: 07/18/2014 12:45     Performed at [facility]   Culture     Final   Value:        BLOOD CULTURE RECEIVED NO GROWTH TO DATE CULTURE WILL BE HELD FOR 5 DAYS BEFORE ISSUING A FINAL NEGATIVE REPORT     Performed at [facility]   Report Status PENDING   Incomplete  CULTURE, BLOOD (ROUTINE X 2)     Status: None   Collection Time    07/18/14  9:20 AM      Result Value Ref Range Status   Specimen Description BLOOD LEFT HAND   Final   Special Requests     Final   Value: BOTTLES DRAWN AEROBIC AND ANAEROBIC 10CC BLUE, 5CC RED   Culture  Setup Time     Final   Value: 07/18/2014 12:45     Performed at [facility]   Culture     Final   Value:        BLOOD CULTURE RECEIVED NO GROWTH TO DATE CULTURE WILL BE HELD FOR 5 DAYS BEFORE ISSUING A FINAL NEGATIVE REPORT     Performed at [facility]   Report Status PENDING   Incomplete  URINE CULTURE     Status: None   Collection Time    07/18/14  8:30 PM      Result Value Ref Range Status   Specimen Description URINE, CLEAN CATCH   Final   Special Requests NONE    Final   Culture  Setup Time     Final   Value: 07/18/2014 00:38     Performed  at [facility] Count     Final   Value: 3,000 COLONIES/ML     Performed at [facility]   Culture     Final   Value: INSIGNIFICANT GROWTH     Performed at [facility]   Report Status 07/20/2014 FINAL   Final     Studies: Ct Abdomen Wo Contrast  07/27/2014   CLINICAL DATA:  Patient with history of perforated bowel. Reassess status post upper GI.  EXAM: CT ABDOMEN WITHOUT CONTRAST  TECHNIQUE: Multidetector CT imaging of the abdomen was performed following the standard protocol without IV contrast.  COMPARISON:  07/20/2014; 07/18/2014.  FINDINGS: Normal heart size.  Coronary arterial calcifications.  Lack of intravenous contrast material limits evaluation of the solid organ parenchyma.  Noncontrast enhanced liver and spleen are unremarkable. Suggestion of gas within the decompressed gallbladder lumen. There is atrophy of the pancreas. Adrenal glands are grossly unremarkable. Cyst within the superior pole of the right kidney. Cystic lesion within the inferior pole of the right kidney with a peripheral thick rim of calcification. Normal caliber abdominal aorta with scattered calcified atherosclerotic plaque. 1.1 cm gastrohepatic lymph node. Additional prominent mesenteric lymph nodes are demonstrated.  A small amount of residual free intraperitoneal air is demonstrated. Oral contrast material is demonstrated throughout the visualized bowel. Adjacent to the second portion of the duodenum is a 2.5 cm collection of oral contrast without surrounding inflammatory change. There is residual inflammatory stranding about the gastric antrum and proximal duodenum.  Extensive degenerative changes of the visualized thoracolumbar spine.  IMPRESSION: Small amount of residual free intraperitoneal air compatible with history of perforated viscus. Oral contrast material is demonstrated throughout the visualized  stomach, small and large bowel. Adjacent to the medial aspect of the D2 segment of the duodenum there is a focal outpouching of contrast which is favored to represent contrast within a duodenum diverticulum given the stability in appearance over the prior 2 days and no significant surrounding inflammatory change.   Electronically Signed   By: Annia Belt M.D.   On: 07/27/2014 15:55   Dg Chest Port 1 View  27-Jul-2014   CLINICAL DATA:  Confirm line placement  EXAM: PORTABLE CHEST - 1 VIEW  COMPARISON:  07/18/2014 CT  FINDINGS: Right PICC, with catheter tip projecting over the proximal to mid SVC. Aortic atherosclerosis. Prominent mediastinal fat. Cardiomediastinal contours otherwise within normal range. Minimal lung base atelectasis bilaterally. Otherwise, no confluent airspace opacity, pleural effusion, or pneumothorax. Advanced bilateral shoulder degenerative changes.  IMPRESSION: Right PICC, with catheter tip projecting over the proximal to mid SVC.   Electronically Signed   By: Jearld Lesch M.D.   On: 07/27/14 22:16   Dg Ugi W/water Sol Cm  07/27/14   CLINICAL DATA:  Evaluate for perforation  EXAM: WATER SOLUBLE UPPER GI SERIES  TECHNIQUE: Single-column upper GI series was performed using water soluble contrast.  CONTRAST:  75mL OMNIPAQUE IOHEXOL 300 MG/ML  SOLN  COMPARISON:  07/20/2014  FLUOROSCOPY TIME:  1 min 22 seconds  FINDINGS: The patient ingested water-soluble contrast material. This passed through the distal esophagus and opacifies the stomach, duodenum and proximal small bowel loops. Small collection of contrast material along the D2 segment of the duodenum is favored to represent a large duodenal diverticulum. No extravasation of contrast material identified.  IMPRESSION: 1. No evidence for active extravasation of contrast material. 2. Contrast collection along the descending portion of the duodenum is favored to represent a duodenal diverticulum. This  is similar to 07/20/2014.    Electronically Signed   By: Signa Kell M.D.   On: 07/22/2014 17:13    Scheduled Meds: . aspirin  300 mg Rectal Daily  . enoxaparin (LOVENOX) injection  30 mg Subcutaneous Q24H  . furosemide  20 mg Intravenous Daily  . levothyroxine  37.5 mcg Intravenous Daily  . pantoprazole (PROTONIX) IV  40 mg Intravenous Q12H  . piperacillin-tazobactam (ZOSYN)  IV  3.375 g Intravenous 3 times per day  . pneumococcal 23 valent vaccine  0.5 mL Intramuscular Tomorrow-1000  . potassium chloride  10 mEq Intravenous Q1 Hr x 4   Continuous Infusions: . dextrose 5 % and 0.45% NaCl 75 mL/hr at 07/21/14 2236  . diltiazem (CARDIZEM) infusion 10 mg/hr (07/23/14 0855)    Active Problems:   Fall   Unspecified hypothyroidism   Diabetes mellitus   Hypertension   Osteoarthritis   Restless leg   Tibia fracture   Atrial fibrillation    Time spent: 35 min    Antonetta Clanton  [facility] Pager [phone]. If 7PM-7AM, please contact night-coverage at [email or web address], password [facility] 07/23/2014, 9:23 AM  LOS: 7 days

## 2014-07-23 NOTE — Progress Notes (Signed)
Subjective: Ms. Rinck reports feeling well this morning. She is in the process of having her TTE done. She has no new complaints at this time. She continues to deny any new HA, CP, palpitations, SOB, N, V, fever, chills, leg swelling or pain. She has minimal complaints about her leg. She only notes pain when she works with PT, but it is tolerable.   Objective: Vital signs in last 24 hours: Temp:  [98.2 F (36.8 C)-99.3 F (37.4 C)] 98.5 F (36.9 C) (09/02 0452) Pulse Rate:  [99-102] 102 (09/02 0452) Resp:  [18] 18 (09/02 0452) BP: (122-139)/(71-85) 139/85 mmHg (09/02 0452) SpO2:  [99 %] 99 % (09/02 0452)  Intake/Output from previous day: 09/01 0701 - 09/02 0700 In: 1180 [I.V.:980; IV Piggyback:200] Out: -  Intake/Output this shift:     Recent Labs  07/21/14 0620 07/22/14 0352 07/23/14 0515  HGB 11.7* 12.3 9.1*    Recent Labs  07/22/14 0352 07/23/14 0515  WBC 9.0 8.6  RBC 4.19 3.03*  HCT 37.4 27.6*  PLT 594* 514*    Recent Labs  07/22/14 0352 07/23/14 0515  NA 146 145  K 2.8* 3.0*  CL 106 109  CO2 25 27  BUN 32* 29*  CREATININE 0.96 0.95  GLUCOSE 156* 190*  CALCIUM 9.3 8.4   No results found for this basename: LABPT, INR,  in the last 72 hours  WD/WN elderly female in nad. A and O x4. Mood and affect are appropriate. EOMI. Respiration normal and unlabored with [place]. LLE in long leg cast. NV intact bilaterally with brisk capillary refill. 5/5 strength of toe extensors and flexors bilaterally. No lower exremity swelling noted. No palpable cords. No lymphadenopathy.   Assessment/Plan: L proximal tibia shaft fracture- non-op treatment, long-leg cast on 8/27. NWB LLE, up with PT when appropriate given other medical concerns. Continue elevation of LLE. Plan for eventual SNF placement at [facility] place. On lovenox for DVT prophylaxis. D/C per IM team when medically stable.   New onset A fib with RVR- on IV metoprolol per cardiology. TTE in process.Treatment per  cardiology and IM team.   Pneumoperitoneum: likely secondary to perforated colon. Had CT abdomen and UGI W/ contrast 9/1, no significant change since previous studies. She and her daughter prefer non-op treatment at this time.On IV Zosyn. Afebrile with WBC in normal range. Treatment recs per IM and surgery teams.    Trinadee Verhagen HOWELLS 07/23/2014, 8:10 AM

## 2014-07-23 NOTE — Progress Notes (Signed)
Pt with unresponsive episode during Cardiology PA assessment. Upon assessment of pt, pt unresponsive to sternal rub. O2 sats 78% on 2L, HR AFib in the 100s, BP 100-120s, CBG 174.  O2 increased to 6L. Rapid RN and Attending MD called to bedside.  Ammonia placed near pt's nose by Cardiology PA and pt woke up and was A&Ox4. Pt states she was able to hear staff calling name but unable to respond. Attending arrives at bedside and orders received to move pt to SDU. Will carry out orders and continue to monitor pt closely.

## 2014-07-23 NOTE — Progress Notes (Signed)
Called to patients bedside with episode of unresponsiveness.  RN Delice Bison reports several bloody stools this AM.  Patient was at baseline around 1145 - cardiology PA found patient unresponsive around 1240 - per report unresponsive to sternal rub - resps ok - skin cool and dry -- pale - she responded to ammonia capsule by Theodore Demark PA.  On my arrival she is awake and alert - cool and dry - pale - talking - neuro at her baseline per RN Delice Bison.  No focal neuro signs.  BP left arm manual 103/52 HR 103 afib RR 12 O2 sats 100% now on 6 liters O2 - was on 2 liters - O2 sats at that time 78% on 2 liters per [employer]. Bil BS = clear.   Some slight abdominal pain - no CP or SOB. No events noted on cardiac monitor.  Patient states she heard them calling her name but could not respond.  Dr. Tresa Endo present.  Dr. Blake Divine present.  Orders for SDU transfer - GI consult was made this AM by Dr. Blake Divine after bloody stools.  Daughter updated per staff.  Will watch.  Call as needed.  Now with SDU transfer orders. Delice Bison RN to call as needed.

## 2014-07-23 NOTE — Progress Notes (Signed)
PT Cancellation Note  Patient Details Name: Kelli Pena MRN: [id number] DOB: [birth year]   Cancelled Treatment:    Reason Eval/Treat Not Completed: Medical issues which prohibited therapy. Pt with episode of unresponsiveness today; new orders to transfer to SDU. Will hold on therapy today till pt is medically ready.    Donnamarie Poag [place], [place]  [phone] 07/23/2014, 2:37 PM

## 2014-07-23 NOTE — Consult Note (Signed)
[facility] Consult: 1:20 PM 07/23/2014  LOS: 7 days    Referring Provider: Dr Blake Divine Primary Care Physician:  [name], MD Primary Gastroenterologist:  unassigned     Reason for Consultation:  Rectal bleeding   HPI: Kelli Pena is a 80 y.o. female.  PMH hypertension, diabetes mellitus, restless leg syndrome, arthritis. Living alone independently at home.  S/p open cholecystectomy at least 30 years ago.   Admitted 8/27 with left tibial fracture after a fall at home.  There was no associated syncope. Leg was casted.  Found to be in Afib, rate 150s. Lovenox initiated 8/27, discontinued today.  Has not received Heparin drip.  Also ruled in for SIRS of unclear etiology.  Initial abdominal xray was benign.  Developed AMS changes with decreased LOC felt secondary to narcotics.   But CTA of chest  done 8/30 to rule out PE showed pneumoperitoneum.  Surgery initially felt she had perforated colon and pt/family decided against surgery.  However contrasted CT abdomen/pelvis 8/30 showed small volume pneumoperitoneum and inflammation in region duodenal bulb, all favoring dx of perforated distal gastric or bulbar ulcer.  Also noted: CBD 12 mm, gas within GB, calcification in pancreas,  extensive colonic diverticulosis.   UGI series 9/1:  No leak, likely duodenal diverticulum in region of descending duodenum.  Follow up non-contrast CT abdomen 07/22/14: smaller volume pneumoperitoneum, duodenal diverticulum.  She has been on IV BID Protonix since 8/27.   Pt with baseline constipation and had not had BM for 4 or 5 days PTA.  At home pt self administers enemas. Intermittent abdominal pain but no n/v/anorexia at home.  In last several days, despite being NPO, having frequent loose stool  These were brown until dark, maroon  stool noted beginning 9/1 PM and maroon stools this AM.  Pt denies significant abdominal pain. Had advanced from NPO to regular diet on 8/27 but made NPO.  There is no RN documentation of emesis.  Her Hgb was ranging 11.7 to 12.3 but dropped to 9.1, then 9.8 today. Only coags were normal on 8/28.   BUN as high as 37 on 8/30, 29 today.  LFTs normal.   This AM during cardiology rounds, became unresponsive, but quickly roused and became talkative, oriented x 2 after ammonia capsule.  Plan is to tranfer pt to SDU.       Past Medical History  Diagnosis Date  . Hypertension   . Diabetes mellitus without complication   . Arthritis   . Hypothyroid   . Fibromyalgia   . Complication of anesthesia     trouble waking up, very cold, hypotension  . Atrial fibrillation     Past Surgical History  Procedure Laterality Date  . Hip surgery    . Cholecystectomy    . Eye surgery     FAMILY HISTORY: Mother, Parkinson's and breast cancer. Father, CVA at  age 59. Daughter, healthy. Twin sister, arthritis and PVD.    Prior to Admission medications   Medication Sig Start Date End Date Taking? Authorizing Provider  aspirin 325 MG  tablet Take 325 mg by mouth daily with breakfast.   Yes Historical Provider, MD  diclofenac sodium (VOLTAREN) 1 % GEL Apply 4 g topically 4 (four) times daily as needed (for pain).    Yes Historical Provider, MD  DULoxetine (CYMBALTA) 60 MG capsule Take 60 mg by mouth every morning.  12/12/13 12/12/14 Yes Gillian Scarce, MD  furosemide (LASIX) 40 MG tablet Take 40 mg by mouth daily.   Yes Historical Provider, MD  gabapentin (NEURONTIN) 600 MG tablet Take 600 mg by mouth at bedtime as needed (for pain).    Yes Historical Provider, MD  HYDROcodone-acetaminophen (NORCO/VICODIN) 5-325 MG per tablet Take 1 tablet by mouth every 6 (six) hours as needed for moderate pain.  02/13/13  Yes Mahima Pandey, MD  irbesartan (AVAPRO) 150 MG tablet Take 150 mg by mouth daily.   Yes Historical  Provider, MD  levothyroxine (SYNTHROID, LEVOTHROID) 75 MCG tablet Take 75 mcg by mouth daily before breakfast.   Yes Historical Provider, MD  lidocaine (LIDODERM) 5 % Place 3 patches onto the skin daily as needed (for pain). Apply to both knees and right shoulder. Remove & Discard patch within 12 hours or as directed by MD   Yes Historical Provider, MD  Multiple Vitamin (MULTIVITAMIN) tablet Take 1 tablet by mouth daily.   Yes Historical Provider, MD  piroxicam (FELDENE) 20 MG capsule Take 20 mg by mouth daily. With food   Yes Historical Provider, MD  polyethylene glycol (MIRALAX / GLYCOLAX) packet Take 4-17 g by mouth daily as needed (constipation). 12/28/12  Yes Joseph Art, DO  POTASSIUM PO Take 1 tablet by mouth daily.    Yes Historical Provider, MD  rOPINIRole (REQUIP) 4 MG tablet Take 4 mg by mouth at bedtime.   Yes Historical Provider, MD  sulfamethoxazole-trimethoprim (BACTRIM DS) 800-160 MG per tablet Take 1 tablet by mouth 2 (two) times daily. For 10 days. Started 07/11/14. Last dose should be 07/20/14. 07/11/14  Yes Historical Provider, MD  traMADol (ULTRAM) 50 MG tablet Take 50 mg by mouth every 6 (six) hours as needed for moderate pain.    Yes Historical Provider, MD  Vitamin D, Ergocalciferol, (DRISDOL) 50000 UNITS CAPS capsule Take 50,000 Units by mouth every 7 (seven) days. Saturday. 12/12/13 12/12/14  Gillian Scarce, MD    Scheduled Meds: . furosemide  20 mg Intravenous Daily  . levothyroxine  37.5 mcg Intravenous Daily  . pantoprazole (PROTONIX) IV  40 mg Intravenous Q12H  . piperacillin-tazobactam (ZOSYN)  IV  3.375 g Intravenous 3 times per day  . pneumococcal 23 valent vaccine  0.5 mL Intramuscular Tomorrow-1000  . potassium chloride  10 mEq Intravenous Q1 Hr x 4   Infusions: . dextrose 5 % and 0.45% NaCl 75 mL/hr at 07/23/14 1128  . diltiazem (CARDIZEM) infusion 10 mg/hr (07/23/14 0855)   PRN Meds: acetaminophen, fentaNYL, promethazine, sodium chloride   Allergies as of  07/16/2014 - Review Complete 07/16/2014  Allergen Reaction Noted  . Tetanus toxoids Swelling 12/26/2012    Family History  Problem Relation Age of Onset  . Stroke Father   . Arthritis Sister   . Arthritis Daughter     History   Social History  . Marital Status: Widowed    Spouse Name: N/A    Number of Children: N/A  . Years of Education: 12   Occupational History  . RETIRED     Social History Main Topics  . Smoking status: Former Smoker -- 1.50 packs/day for 20 years  Types: Cigarettes    Quit date: 11/21/1965  . Smokeless tobacco: Never Used  . Alcohol Use: Yes     Comment: Rarely   . Drug Use: No  . Sexual Activity: No   Other Topics Concern  . Not on file   Social History Narrative   Marital Status: Widowed    Children: Daughter Oceanographer)   Pets:  Dog (1)    Living Situation: Lives alone.   Occupation: Retired Training and development officer)    Education: Engineer, agricultural    Tobacco Use/Exposure:  She smoked 1 - 1 1/2 ppd for 20 years. She quit in 1967.    Alcohol Use:  Rarely   Drug Use:  None   Diet:  Regular   Exercise:  None   Hobbies: Playing with Dog/ Taking Picture/ Reading             REVIEW OF SYSTEMS: Constitutional:  Stable weight ENT:  No nose bleeds Pulm:  No SOB or cough CV:  No palpitations, no LE edema.  GU:  No hematuria, no frequency GI:  Per HPI Heme:  Per HPI   Transfusions:  None in past Neuro:  No headaches, no peripheral tingling or numbness.  HOH.  Wears hearing aids bil. No seizures.  Derm:  No itching, no rash or sores.  Endocrine:  No sweats or chills.  No polyuria or dysuria Immunization: pneumovax given this admission.  Travel:  None beyond local counties in last few months.    PHYSICAL EXAM: Vital signs in last 24 hours: Filed Vitals:   07/23/14 1331  BP: 117/81  Pulse: 105  Temp: 98.2  Resp: 18  Sats 100%  Wt Readings from Last 3 Encounters:  07/16/14 67.586 kg (149 lb)  07/17/13 67.586 kg (149 lb)  03/21/13 68.312  kg (150 lb 9.6 oz)    General: looks well.  Alert and comfortable.  yooung appearing for age.  Head:  No swelling or asymmetry  Eyes:  No icterus or pallor Ears:  HOH  Nose:  No discharge Mouth:  Clear and moist MM Neck:  No JVD, no bruits.  No TMG or mass Lungs:  Clear bil.  Breathing is not labored Heart: RRR.  Slightly tachy.  No MRG Abdomen:  Soft, ND, NT.  No mass, no HSM.  No bruits.  Active BS.   Rectal: deep marroon color, no smell of melena.  No mass   Musc/Skeltl: cast on left leg Extremities:  Slight edema in toes on left leg  Neurologic:  Oriented to self and place.  Not to date.  Appropriate.  Skin:  No telangectasia, no sores, no rash Tattoos:  none Nodes:  No cervical adenopathy.    Psych:  Cooperative, animated.   Intake/Output from previous day: 09/01 0701 - 09/02 0700 In: 1180 [I.V.:980; IV Piggyback:200] Out: -  Intake/Output this shift: Total I/O In: 0  Out: 500 [Stool:500]  LAB RESULTS:  Recent Labs  07/21/14 0620 07/22/14 0352 07/23/14 0515 07/23/14 1045  WBC 9.7 9.0 8.6  --   HGB 11.7* 12.3 9.1* 9.8*  HCT 35.7* 37.4 27.6* 30.2*  PLT 603* 594* 514*  --    BMET Lab Results  Component Value Date   NA 145 07/23/2014   NA 146 07/22/2014   NA 148* 07/21/2014   K 3.0* 07/23/2014   K 2.8* 07/22/2014   K 3.9 07/21/2014   CL 109 07/23/2014   CL 106 07/22/2014   CL 109 07/21/2014   CO2 27 07/23/2014  CO2 25 07/22/2014   CO2 22 07/21/2014   GLUCOSE 190* 07/23/2014   GLUCOSE 156* 07/22/2014   GLUCOSE 155* 07/21/2014   BUN 29* 07/23/2014   BUN 32* 07/22/2014   BUN 35* 07/21/2014   CREATININE 0.95 07/23/2014   CREATININE 0.96 07/22/2014   CREATININE 0.98 07/21/2014   CALCIUM 8.4 07/23/2014   CALCIUM 9.3 07/22/2014   CALCIUM 9.3 07/21/2014   LFT No results found for this basename: PROT, ALBUMIN, AST, ALT, ALKPHOS, BILITOT, BILIDIR, IBILI,  in the last 72 hours PT/INR Lab Results  Component Value Date   INR 1.11 07/18/2014   INR 0.98 12/26/2012    C-Diff:  Not  assayed.   RADIOLOGY STUDIES: Ct Abdomen Wo Contrast 07/22/2014   CLINICAL DATA:  Patient with history of perforated bowel. Reassess status post upper GI.  EXAM: CT ABDOMEN WITHOUT CONTRAST  TECHNIQUE: Multidetector CT imaging of the abdomen was performed following the standard protocol without IV contrast.  COMPARISON:  07/20/2014; 07/18/2014.  FINDINGS: Normal heart size.  Coronary arterial calcifications.  Lack of intravenous contrast material limits evaluation of the solid organ parenchyma.  Noncontrast enhanced liver and spleen are unremarkable. Suggestion of gas within the decompressed gallbladder lumen. There is atrophy of the pancreas. Adrenal glands are grossly unremarkable. Cyst within the superior pole of the right kidney. Cystic lesion within the inferior pole of the right kidney with a peripheral thick rim of calcification. Normal caliber abdominal aorta with scattered calcified atherosclerotic plaque. 1.1 cm gastrohepatic lymph node. Additional prominent mesenteric lymph nodes are demonstrated.  A small amount of residual free intraperitoneal air is demonstrated. Oral contrast material is demonstrated throughout the visualized bowel. Adjacent to the second portion of the duodenum is a 2.5 cm collection of oral contrast without surrounding inflammatory change. There is residual inflammatory stranding about the gastric antrum and proximal duodenum.  Extensive degenerative changes of the visualized thoracolumbar spine.  IMPRESSION: Small amount of residual free intraperitoneal air compatible with history of perforated viscus. Oral contrast material is demonstrated throughout the visualized stomach, small and large bowel. Adjacent to the medial aspect of the D2 segment of the duodenum there is a focal outpouching of contrast which is favored to represent contrast within a duodenum diverticulum given the stability in appearance over the prior 2 days and no significant surrounding inflammatory change.    Electronically Signed   By: Annia Belt M.D.   On: 07/22/2014 15:55   Dg Chest Port 1 View 07/22/2014   CLINICAL DATA:  Confirm line placement  EXAM: PORTABLE CHEST - 1 VIEW  COMPARISON:  07/18/2014 CT  FINDINGS: Right PICC, with catheter tip projecting over the proximal to mid SVC. Aortic atherosclerosis. Prominent mediastinal fat. Cardiomediastinal contours otherwise within normal range. Minimal lung base atelectasis bilaterally. Otherwise, no confluent airspace opacity, pleural effusion, or pneumothorax. Advanced bilateral shoulder degenerative changes.  IMPRESSION: Right PICC, with catheter tip projecting over the proximal to mid SVC.   Electronically Signed   By: Jearld Lesch M.D.   On: 07/22/2014 22:16   Dg Ugi W/water Sol Cm 07/22/2014    COMPARISON:  07/20/2014  FLUOROSCOPY TIME:  1 min 22 seconds  FINDINGS: The patient ingested water-soluble contrast material. This passed through the distal esophagus and opacifies the stomach, duodenum and proximal small bowel loops. Small collection of contrast material along the D2 segment of the duodenum is favored to represent a large duodenal diverticulum. No extravasation of contrast material identified.  IMPRESSION: 1. No evidence for active extravasation of contrast  material. 2. Contrast collection along the descending portion of the duodenum is favored to represent a duodenal diverticulum. This is similar to 07/20/2014.   Electronically Signed   By: Signa Kell M.D.   On: 07/22/2014 17:13    ENDOSCOPIC STUDIES: None ever.   IMPRESSION:   *  hematochezia vs UGI bleeding.  BUN is elevated.  Not a candidate for EGD.  Colonic diverticulosis on imaging   *  ABL anemia  *  Apparent perforated distal gastric vs bulbar ulcer.  By follow up studies, this is healing.  Remarkably few GI sxs overall  *  New onset Afib.  Lovenox has been discontinued due to GIB.  EF  65 to 70%.   *  Fall and fracture left tibia, casted at admission.   *  Dilated CBD,  air in GB, pancreatic calcifications. Interesting that pt is s/p open cholecystectomy LFTs are normal. Pt  Has insignificant hx ETOH intake.   *  Colonic diverticulosis.     PLAN:     *  Will check stool for C diff, given all the unusual happenings of this pt and her recent hx of diarrhea want to make sure C diff not an issue though unlikely.   *  Per Dr Arlyce Dice.   *  Follow blood counts.  Continue the PPI.  Agree with holding Lovenox.       Jennye Moccasin  07/23/2014, 1:20 PM Pager: [phone]  GI Attending Note   Chart was reviewed and patient was examined. X-rays and lab were reviewed.   Patient is actively GI bleeding as evidenced by bright red blood per rectum.  She looks relatively well and I suspect that this is more likely a lower GI bleeding source.  We must consider, however, bleeding from the upper GI tract, in view of recently delineated pathology.  It would be unwise to do an upper endoscopy since this could disrupt a healing perforation.  I am not certain what to make possible gas in the gallbladder fossa.  Recommendations- bleeding scan.  Hold Lovenox in the face of active bleeding  Barbette Hair. Arlyce Dice, M.D., [facility] [phone]

## 2014-07-23 NOTE — Progress Notes (Signed)
Pt assisted to use bedpan and had bright red bloody stool approximately  . Vital signs stable. M.D notified. New orders received. Will carry out orders and continue to monitor patient closely.

## 2014-07-23 NOTE — Progress Notes (Signed)
  Echocardiogram 2D Echocardiogram has been performed.  Leta Jungling M 07/23/2014, 8:23 AM

## 2014-07-23 NOTE — Progress Notes (Signed)
Pt transferred to 2C16. Report given to RN and questions answered. Pt transferred with belongings. Daughter at bedside during transfer.

## 2014-07-23 NOTE — Progress Notes (Signed)
Patient Name: Kelli Pena Date of Encounter: 07/23/2014  Active Problems:   Fall   Unspecified hypothyroidism   Diabetes mellitus   Hypertension   Osteoarthritis   Restless leg   Tibia fracture   Atrial fibrillation    Patient Profile: Kelli Pena is a 80 y.o. f w/ hx HTN, DM, OA, who presented on 07/17/14 after falling w/ left tibial fracture. Her hospital course has been complicated by SIRS, AMS, a pneumoperitoneum due to a perforated ulcer and new onset afib with RVR. Cardiology was consulted for help with management of atrial fibrillation with RVR.   SUBJECTIVE: Pt initially very difficult to rouse, with sternal rub not successful, but roused with ammonia capsule. Now alert and talkative, oriented x 2.  OBJECTIVE Filed Vitals:   07/22/14 1400 07/22/14 2135 07/23/14 0452 07/23/14 0855  BP: 122/78 132/71 139/85 128/58  Pulse: 99 101 102 105  Temp: 99.3 F (37.4 C) 98.2 F (36.8 C) 98.5 F (36.9 C)   TempSrc: Oral Oral Oral   Resp: Height:      Weight:      SpO2: 99% 99% 99% 100%    Intake/Output Summary (Last 24 hours) at 07/23/14 1236 Last data filed at 07/23/14 1125  Gross per 24 hour  Intake    400 ml  Output    500 ml  Net   -100 ml   Filed Weights   07/16/14 2107  Weight: 149 lb (67.586 kg)    PHYSICAL EXAM General: Well developed, well nourished, female in no acute distress. Head: Normocephalic, atraumatic.  Neck: Supple without bruits, JVD not elevated. Lungs:  Resp regular and unlabored, upper airway congestion. Heart: Irreg Irreg, S1, S2, no S3, S4, or murmur; no rub. Abdomen: Soft, non-tender, non-distended, BS + x 4.  Extremities: No clubbing, cyanosis, no edema.  Neuro: Alert and oriented X 3. Moves all extremities spontaneously. Psych: Normal affect.  LABS: CBC: Recent Labs  07/22/14 0352 07/23/14 0515 07/23/14 1045  WBC 9.0 8.6  --   HGB 12.3 9.1* 9.8*  HCT 37.4 27.6* 30.2*  MCV 89.3 91.1  --   PLT 594* 514*   --    Basic Metabolic Panel: Recent Labs  07/21/14 0620 07/22/14 0352 07/23/14 0515 07/23/14 1045  NA 148* 146 145  --   K 3.9 2.8* 3.0*  --   CL 109 106 109  --   CO2 --   GLUCOSE 155* 156* 190*  --   BUN 35* 32* 29*  --   CREATININE 0.98 0.96 0.95  --   CALCIUM 9.3 9.3 8.4  --   MG 2.4  --   --  2.0  PHOS 3.2  --   --   --    TELE:   Atrial fib, RVR at times.     ECHO: 07/23/2014 Study Conclusions - Left ventricle: The cavity size was normal. Wall thickness was increased in a pattern of mild LVH. Systolic function was vigorous. The estimated ejection fraction was in the range of 65% to 70%. Wall motion was normal; there were no regional wall motion abnormalities. - Mitral valve: There was mild regurgitation.   Radiology/Studies: Ct Abdomen Wo Contrast 07/22/2014   CLINICAL DATA:  Patient with history of perforated bowel. Reassess status post upper GI.  EXAM: CT ABDOMEN WITHOUT CONTRAST  TECHNIQUE: Multidetector CT imaging of the abdomen was performed following the standard protocol without IV contrast.  COMPARISON:  07/20/2014; 07/18/2014.  FINDINGS: Normal heart size.  Coronary arterial calcifications.  Lack of intravenous contrast material limits evaluation of the solid organ parenchyma.  Noncontrast enhanced liver and spleen are unremarkable. Suggestion of gas within the decompressed gallbladder lumen. There is atrophy of the pancreas. Adrenal glands are grossly unremarkable. Cyst within the superior pole of the right kidney. Cystic lesion within the inferior pole of the right kidney with a peripheral thick rim of calcification. Normal caliber abdominal aorta with scattered calcified atherosclerotic plaque. 1.1 cm gastrohepatic lymph node. Additional prominent mesenteric lymph nodes are demonstrated.  A small amount of residual free intraperitoneal air is demonstrated. Oral contrast material is demonstrated throughout the visualized bowel. Adjacent to the second  portion of the duodenum is a 2.5 cm collection of oral contrast without surrounding inflammatory change. There is residual inflammatory stranding about the gastric antrum and proximal duodenum.  Extensive degenerative changes of the visualized thoracolumbar spine.  IMPRESSION: Small amount of residual free intraperitoneal air compatible with history of perforated viscus. Oral contrast material is demonstrated throughout the visualized stomach, small and large bowel. Adjacent to the medial aspect of the D2 segment of the duodenum there is a focal outpouching of contrast which is favored to represent contrast within a duodenum diverticulum given the stability in appearance over the prior 2 days and no significant surrounding inflammatory change.   Electronically Signed   By: Annia Belt M.D.   On: 07/22/2014 15:55   Dg Chest Port 1 View 07/22/2014   CLINICAL DATA:  Confirm line placement  EXAM: PORTABLE CHEST - 1 VIEW  COMPARISON:  07/18/2014 CT  FINDINGS: Right PICC, with catheter tip projecting over the proximal to mid SVC. Aortic atherosclerosis. Prominent mediastinal fat. Cardiomediastinal contours otherwise within normal range. Minimal lung base atelectasis bilaterally. Otherwise, no confluent airspace opacity, pleural effusion, or pneumothorax. Advanced bilateral shoulder degenerative changes.  IMPRESSION: Right PICC, with catheter tip projecting over the proximal to mid SVC.   Electronically Signed   By: Jearld Lesch M.D.   On: 07/22/2014 22:16   Dg Ugi W/water Sol Cm 07/22/2014   CLINICAL DATA:  Evaluate for perforation  EXAM: WATER SOLUBLE UPPER GI SERIES  TECHNIQUE: Single-column upper GI series was performed using water soluble contrast.  CONTRAST:  75mL OMNIPAQUE IOHEXOL 300 MG/ML  SOLN  COMPARISON:  07/20/2014  FLUOROSCOPY TIME:  1 min 22 seconds  FINDINGS: The patient ingested water-soluble contrast material. This passed through the distal esophagus and opacifies the stomach, duodenum and proximal  small bowel loops. Small collection of contrast material along the D2 segment of the duodenum is favored to represent a large duodenal diverticulum. No extravasation of contrast material identified.  IMPRESSION: 1. No evidence for active extravasation of contrast material. 2. Contrast collection along the descending portion of the duodenum is favored to represent a duodenal diverticulum. This is similar to 07/20/2014.   Electronically Signed   By: Signa Kell M.D.   On: 07/22/2014 17:13    Current Medications:  . aspirin  300 mg Rectal Daily  . enoxaparin (LOVENOX) injection  30 mg Subcutaneous Q24H  . furosemide  20 mg Intravenous Daily  . levothyroxine  37.5 mcg Intravenous Daily  . pantoprazole (PROTONIX) IV  40 mg Intravenous Q12H  . piperacillin-tazobactam (ZOSYN)  IV  3.375 g Intravenous 3 times per day  . pneumococcal 23 valent vaccine  0.5 mL Intramuscular Tomorrow-1000  . potassium chloride  10 mEq Intravenous Q1 Hr x 4   . dextrose 5 %  and 0.45% NaCl 75 mL/hr at 07/23/14 1128  . diltiazem (CARDIZEM) infusion 10 mg/hr (07/23/14 0855)    ASSESSMENT AND PLAN: Kelli Pena is a 80 y.o. female with a history of hypertension, diabetes mellitus and arthritis who presented to [facility] on 07/17/14 after falling and experiencing a left tibial fracture. Her hospital course has been complicated by SIRS, AMS, a pneumoperitoneum due to a perforated ulcer and new onset afib with RVR. Cardiology was consulted for help with management of atrial fibrillation with RVR.    Atrial fibrillation, RVR - HR approx 100 at rest, on Dilt gtt at 10 mg/hr. No change for now. EF preserved by echo 09/02, results above.     Grossly bloody stools - reason for not being on full anticoagulation. This and other issues, per IM.  Active Problems:   Fall   Unspecified hypothyroidism   Diabetes mellitus   Hypertension   Osteoarthritis   Restless leg   Tibia fracture    Signed, Theodore Demark , PA-C 12:36  PM 07/23/2014   Patient seen and examined. Agree with assessment and plan. AF rate in 80 - 90's presently.  NPO due to pnemoperitoneum, on iv cardizem. No chest pain. Not on anticoagulation with bloody stools. No chest pain or shortness of breath.   Lennette Bihari, MD, [facility] 07/23/2014 1:10 PM

## 2014-07-23 NOTE — Progress Notes (Signed)
Pt assisted to bedpan to have another bloody stool approximately 350 mL. MD notified and to see pt. Will continue to monitor pt closely.

## 2014-07-24 ENCOUNTER — Inpatient Hospital Stay (HOSPITAL_COMMUNITY): Payer: Medicare Other

## 2014-07-24 DIAGNOSIS — N289 Disorder of kidney and ureter, unspecified: Secondary | ICD-10-CM

## 2014-07-24 LAB — CULTURE, BLOOD (ROUTINE X 2)
Culture: NO GROWTH
Culture: NO GROWTH

## 2014-07-24 LAB — BASIC METABOLIC PANEL
ANION GAP: 13 (ref 5–15)
BUN: 37 mg/dL — ABNORMAL HIGH (ref 6–23)
CALCIUM: 8.2 mg/dL — AB (ref 8.4–10.5)
CO2: 24 meq/L (ref 19–32)
Chloride: 107 mEq/L (ref 96–112)
Creatinine, Ser: 1.43 mg/dL — ABNORMAL HIGH (ref 0.50–1.10)
GFR calc Af Amer: 36 mL/min — ABNORMAL LOW (ref >90)
GFR calc non Af Amer: 31 mL/min — ABNORMAL LOW (ref >90)
GLUCOSE: 193 mg/dL — AB (ref 70–99)
Potassium: 3.6 mEq/L — ABNORMAL LOW (ref 3.7–5.3)
SODIUM: 144 meq/L (ref 137–147)

## 2014-07-24 LAB — CBC
HCT: 22 % — ABNORMAL LOW (ref 36.0–46.0)
HEMOGLOBIN: 7.1 g/dL — AB (ref 12.0–15.0)
MCH: 29.1 pg (ref 26.0–34.0)
MCHC: 32.3 g/dL (ref 30.0–36.0)
MCV: 90.2 fL (ref 78.0–100.0)
Platelets: 480 10*3/uL — ABNORMAL HIGH (ref 150–400)
RBC: 2.44 MIL/uL — ABNORMAL LOW (ref 3.87–5.11)
RDW: 14.4 % (ref 11.5–15.5)
WBC: 11.6 10*3/uL — AB (ref 4.0–10.5)

## 2014-07-24 LAB — PREPARE RBC (CROSSMATCH)

## 2014-07-24 LAB — HEMOGLOBIN AND HEMATOCRIT, BLOOD
HCT: 29.4 % — ABNORMAL LOW (ref 36.0–46.0)
Hemoglobin: 10 g/dL — ABNORMAL LOW (ref 12.0–15.0)

## 2014-07-24 LAB — MAGNESIUM: MAGNESIUM: 1.8 mg/dL (ref 1.5–2.5)

## 2014-07-24 MED ORDER — SODIUM CHLORIDE 0.9 % IV SOLN
Freq: Once | INTRAVENOUS | Status: AC
  Administered 2014-07-24: 09:00:00 via INTRAVENOUS

## 2014-07-24 MED ORDER — TECHNETIUM TC 99M-LABELED RED BLOOD CELLS IV KIT
30.0000 | PACK | Freq: Once | INTRAVENOUS | Status: AC | PRN
  Administered 2014-07-24: 30 via INTRAVENOUS

## 2014-07-24 NOTE — Progress Notes (Signed)
[facility] PROGRESS NOTE  Kelli Pena ZOX:[id number] DOB: 26-Nov-1919 [name]: 07/16/2014 PCP: [name], MD Interim summary: This is a 80 yo white female with a history of HTN and DM who has been followed by ortho for several weeks with a left tibia stress fracture. She fell in her bathroom 2 days ago and had severe left leg pain. She came to [facility] and was found to have a nondisplaced left tibia fracture. She had a long cast placed and was admitted for observation for altered mental status. She was found to have intraperitoneal free air, possibly secondary to perforated duodenum or gastric ulcer. Surgical intervention on hold in view of the patient's age and overall state, and she is being treated conservatively with zosyn and NPO. On the night of 9/1 patient developed bright red blood per rectum and her hemoglobin dropped to 7 from the baseline of 11. 2 units of prbc ordered and pending to be given, repeat H&h ordered post transfusion.    Assessment/Plan: Sepsis secondary to perforated duodenal or gastric ulcer: on IV antibiotics. Leukocytosis resolved, patient afebrile .Chest x-ray shows atelectasis in the lung bases, no infiltration .Abdominal x-ray shows no acute abnormality. UA from 07/17/2014 and 8/29 negative for infection. Blood cultures show no growth to date.pt completed 5 days of zosyn, plan to stop the antibiotics in 1 to 2 days. Upper GI series show duodenal diverticulum.   New onset Afib with RVR: rate better controlled. On low dose of diltiazem gtt , plan to wean her off the gtt soon today. Aspirin and lovenox on hold for acute GI bleed.   Bright Red Blood per Rectum: new started overnight. Anticoagulation on hold. Aspirin and lovenox discontinued. IV PPI, NPO, GI consulted. H&H q6 hrs and transfuse to keep H&H >8.  Her hemoglobin dropped to 7, will order 2units of prbc and will get post transfusion H&H. Currently awaiting bleeding scan.   Acute encephalopathy:  An episode of  confusion and unresponsiveness earlier today. Resolved in a few minutes. Borderline bp measurements, neuro exam benign. No focal deficits, no facial droop, speech normal. CT head without contrast ordered and is negative. .   Left tibia fracture: further management as per orthopedics.   Hypothyroidism resume synthroid Restless leg syndrome.  hypertension Diabetes Mellitus type 2 Acute on CKD Hypokalemia Anemia   Code Status: full code.  Family Communication: discussed with daughter over the phone Disposition Plan: pending further investigation.    Consultants:  Surgery  Orthopedics.   Cardiology  gastroenterology  Procedures:    Antibiotics:  zosyn  HPI/Subjective: Alert and talkative.   Objective: Filed Vitals:   07/24/14 1321  BP: 141/54  Pulse: 75  Temp: 98.4 F (36.9 C)  Resp: 21    Intake/Output Summary (Last 24 hours) at 07/24/14 1419 Last data filed at 07/24/14 1120  Gross per 24 hour  Intake 3660.5 ml  Output    250 ml  Net 3410.5 ml   [facility] Weights   07/16/14 2107 07/23/14 1432  Weight: 67.586 kg (149 lb) 65.2 kg (143 lb 11.8 oz)    Exam:   General:  Alert afebrile comfortable  Cardiovascular: s1s2  Respiratory: ctab  Abdomen: soft NT nd bs+  Musculoskeletal: no pedal edema  Data Reviewed: Basic Metabolic Panel:  Recent Labs Lab 07/20/14 0530 07/21/14 0620 07/22/14 0352 07/23/14 0515 07/23/14 1045 07/24/14 0302  NA 142 148* 146 145  --  144  K 4.0 3.9 2.8* 3.0*  --  3.6*  CL 104 109  106 109  --  107  CO2 --  24  GLUCOSE 140* 155* 156* 190*  --  193*  BUN 37* 35* 32* 29*  --  37*  CREATININE 1.23* 0.98 0.96 0.95  --  1.43*  CALCIUM 9.4 9.3 9.3 8.4  --  8.2*  MG  --  2.4  --   --  2.0 1.8  PHOS  --  3.2  --   --   --   --    Liver Function Tests:  Recent Labs Lab 07/19/14 0850  AST 17  ALT 13  ALKPHOS 84  BILITOT 0.6  PROT 6.3  ALBUMIN 2.7*   No results found for this basename: LIPASE,  AMYLASE,  in the last 168 hours  Recent Labs Lab 07/18/14 1620  AMMONIA 19   CBC:  Recent Labs Lab 07/20/14 0530 07/21/14 0620 07/22/14 0352 07/23/14 0515 07/23/14 1045 07/23/14 1845 07/24/14 0302  WBC 12.1* 9.7 9.0 8.6  --   --  11.6*  HGB 12.3 11.7* 12.3 9.1* 9.8* 7.9* 7.1*  HCT 37.8 35.7* 37.4 27.6* 30.2* 24.1* 22.0*  MCV 92.2 89.9 89.3 91.1  --   --  90.2  PLT 504* 603* 594* 514*  --   --  480*   Cardiac Enzymes: No results found for this basename: CKTOTAL, CKMB, CKMBINDEX, TROPONINI,  in the last 168 hours BNP (last 3 results) No results found for this basename: PROBNP,  in the last 8760 hours CBG:  Recent Labs Lab 07/19/14 2102 07/20/14 0646 07/20/14 2149 07/21/14 0620 07/23/14 1249  GLUCAP 130* 132* 174* 141* 174*    Recent Results (from the past 240 hour(s))  CULTURE, BLOOD (ROUTINE X 2)     Status: None   Collection Time    07/18/14  9:07 AM      Result Value Ref Range Status   Specimen Description BLOOD RIGHT HAND   Final   Special Requests BOTTLES DRAWN AEROBIC AND ANAEROBIC 10CC   Final   Culture  Setup Time     Final   Value: 07/18/2014 12:45     Performed at [facility]   Culture     Final   Value: NO GROWTH 5 DAYS     Performed at [facility]   Report Status 07/24/2014 FINAL   Final  CULTURE, BLOOD (ROUTINE X 2)     Status: None   Collection Time    07/18/14  9:20 AM      Result Value Ref Range Status   Specimen Description BLOOD LEFT HAND   Final   Special Requests     Final   Value: BOTTLES DRAWN AEROBIC AND ANAEROBIC 10CC BLUE, 5CC RED   Culture  Setup Time     Final   Value: 07/18/2014 12:45     Performed at [facility]   Culture     Final   Value: NO GROWTH 5 DAYS     Performed at [facility]   Report Status 07/24/2014 FINAL   Final  URINE CULTURE     Status: None   Collection Time    07/18/14  8:30 PM      Result Value Ref Range Status   Specimen Description URINE, CLEAN CATCH   Final    Special Requests NONE   Final   Culture  Setup Time     Final   Value: 07/18/2014 00:38     Performed at [facility]  Colony Count     Final   Value: 3,000 COLONIES/ML     Performed at [facility]   Culture     Final   Value: INSIGNIFICANT GROWTH     Performed at [facility]   Report Status 07/20/2014 FINAL   Final  CLOSTRIDIUM DIFFICILE BY PCR     Status: None   Collection Time    07/23/14  7:20 PM      Result Value Ref Range Status   C difficile by pcr NEGATIVE  NEGATIVE Final     Studies: Ct Abdomen Wo Contrast  2014/08/17   CLINICAL DATA:  Patient with history of perforated bowel. Reassess status post upper GI.  EXAM: CT ABDOMEN WITHOUT CONTRAST  TECHNIQUE: Multidetector CT imaging of the abdomen was performed following the standard protocol without IV contrast.  COMPARISON:  07/20/2014; 07/18/2014.  FINDINGS: Normal heart size.  Coronary arterial calcifications.  Lack of intravenous contrast material limits evaluation of the solid organ parenchyma.  Noncontrast enhanced liver and spleen are unremarkable. Suggestion of gas within the decompressed gallbladder lumen. There is atrophy of the pancreas. Adrenal glands are grossly unremarkable. Cyst within the superior pole of the right kidney. Cystic lesion within the inferior pole of the right kidney with a peripheral thick rim of calcification. Normal caliber abdominal aorta with scattered calcified atherosclerotic plaque. 1.1 cm gastrohepatic lymph node. Additional prominent mesenteric lymph nodes are demonstrated.  A small amount of residual free intraperitoneal air is demonstrated. Oral contrast material is demonstrated throughout the visualized bowel. Adjacent to the second portion of the duodenum is a 2.5 cm collection of oral contrast without surrounding inflammatory change. There is residual inflammatory stranding about the gastric antrum and proximal duodenum.  Extensive degenerative changes of the visualized  thoracolumbar spine.  IMPRESSION: Small amount of residual free intraperitoneal air compatible with history of perforated viscus. Oral contrast material is demonstrated throughout the visualized stomach, small and large bowel. Adjacent to the medial aspect of the D2 segment of the duodenum there is a focal outpouching of contrast which is favored to represent contrast within a duodenum diverticulum given the stability in appearance over the prior 2 days and no significant surrounding inflammatory change.   Electronically Signed   By: Annia Belt M.D.   On: 08/17/14 15:55   Ct Head Wo Contrast  07/23/2014   CLINICAL DATA:  Possible TIA  EXAM: CT HEAD WITHOUT CONTRAST  TECHNIQUE: Contiguous axial images were obtained from the base of the skull through the vertex without intravenous contrast.  COMPARISON:  None.  FINDINGS: Bony calvarium is intact. No findings to suggest acute hemorrhage, acute infarction or space-occupying mass lesion are noted.  IMPRESSION: No acute intracranial abnormality seen.   Electronically Signed   By: Alcide Clever M.D.   On: 07/23/2014 17:41   Dg Chest Port 1 View  17-Aug-2014   CLINICAL DATA:  Confirm line placement  EXAM: PORTABLE CHEST - 1 VIEW  COMPARISON:  07/18/2014 CT  FINDINGS: Right PICC, with catheter tip projecting over the proximal to mid SVC. Aortic atherosclerosis. Prominent mediastinal fat. Cardiomediastinal contours otherwise within normal range. Minimal lung base atelectasis bilaterally. Otherwise, no confluent airspace opacity, pleural effusion, or pneumothorax. Advanced bilateral shoulder degenerative changes.  IMPRESSION: Right PICC, with catheter tip projecting over the proximal to mid SVC.   Electronically Signed   By: Jearld Lesch M.D.   On: 2014/08/17 22:16   Dg Ugi W/water Sol Cm  August 17, 2014   CLINICAL DATA:  Evaluate for perforation  EXAM: WATER SOLUBLE UPPER GI SERIES  TECHNIQUE: Single-column upper GI series was performed using water soluble contrast.   CONTRAST:  75mL OMNIPAQUE IOHEXOL 300 MG/ML  SOLN  COMPARISON:  07/20/2014  FLUOROSCOPY TIME:  1 min 22 seconds  FINDINGS: The patient ingested water-soluble contrast material. This passed through the distal esophagus and opacifies the stomach, duodenum and proximal small bowel loops. Small collection of contrast material along the D2 segment of the duodenum is favored to represent a large duodenal diverticulum. No extravasation of contrast material identified.  IMPRESSION: 1. No evidence for active extravasation of contrast material. 2. Contrast collection along the descending portion of the duodenum is favored to represent a duodenal diverticulum. This is similar to 07/20/2014.   Electronically Signed   By: Signa Kell M.D.   On: 07/22/2014 17:13    Scheduled Meds: . antiseptic oral rinse  7 mL Mouth Rinse BID  . levothyroxine  37.5 mcg Intravenous Daily  . pantoprazole (PROTONIX) IV  40 mg Intravenous Q12H  . piperacillin-tazobactam (ZOSYN)  IV  3.375 g Intravenous 3 times per day  . pneumococcal 23 valent vaccine  0.5 mL Intramuscular Tomorrow-1000   Continuous Infusions: . dextrose 5 % and 0.45% NaCl 75 mL/hr at 07/24/14 0800  . diltiazem (CARDIZEM) infusion 10 mg/hr (07/24/14 0951)    Active Problems:   Fall   Unspecified hypothyroidism   Diabetes mellitus   Hypertension   Osteoarthritis   Restless leg   Tibia fracture   Atrial fibrillation   Blood in stool    Time spent: 35 min    Christyne Mccain  [facility] Pager [phone]. If 7PM-7AM, please contact night-coverage at [email or web address], password [facility] 07/24/2014, 2:19 PM  LOS: 8 days

## 2014-07-24 NOTE — Progress Notes (Signed)
Patient Name: Kelli Pena Date of Encounter: 07/24/2014     Active Problems:   Fall   Unspecified hypothyroidism   Diabetes mellitus   Hypertension   Osteoarthritis   Restless leg   Tibia fracture   Atrial fibrillation   Blood in stool    SUBJECTIVE  Pleasant. Denies any CP or SOB. Family states she has been having intermittent dizziness for past several weeks. No prior a-fib hx.   CURRENT MEDS . antiseptic oral rinse  7 mL Mouth Rinse BID  . furosemide  20 mg Intravenous Daily  . levothyroxine  37.5 mcg Intravenous Daily  . pantoprazole (PROTONIX) IV  40 mg Intravenous Q12H  . piperacillin-tazobactam (ZOSYN)  IV  3.375 g Intravenous 3 times per day  . pneumococcal 23 valent vaccine  0.5 mL Intramuscular Tomorrow-1000    OBJECTIVE  Filed Vitals:   07/24/14 0806 07/24/14 1050 07/24/14 1120 07/24/14 1220  BP: 129/43 144/45  94/77  Pulse: 66 74  78  Temp: 98.5 F (36.9 C) 97.8 F (36.6 C) 97.1 F (36.2 C) 99.1 F (37.3 C)  TempSrc: Axillary Oral Oral Oral  Resp: 17 7  14   Height:      Weight:      SpO2: 100% 100% 100% 100%    Intake/Output Summary (Last 24 hours) at 07/24/14 1234 Last data filed at 07/24/14 1120  Gross per 24 hour  Intake 3660.5 ml  Output    250 ml  Net 3410.5 ml   [facility] Weights   07/16/14 2107 07/23/14 1432  Weight: 149 lb (67.586 kg) 143 lb 11.8 oz (65.2 kg)    PHYSICAL EXAM  General: Pleasant, NAD. Neuro: Alert and oriented X 3. Moves all extremities spontaneously. Psych: Normal affect. HEENT:  Normal  Neck: Supple without bruits or JVD. Lungs:  Resp regular and unlabored, CTA. Heart: RRR no s3, s4, or murmurs. Abdomen: Soft, non-tender, non-distended, BS + x 4.  Extremities: No clubbing, cyanosis or edema. DP/PT/Radials 2+ and equal bilaterally.  Accessory Clinical Findings  CBC  Recent Labs  07/23/14 0515  07/23/14 1845 07/24/14 0302  WBC 8.6  --   --  11.6*  HGB 9.1*  < > 7.9* 7.1*  HCT 27.6*  < > 24.1*  22.0*  MCV 91.1  --   --  90.2  PLT 514*  --   --  480*  < > = values in this interval not displayed. Basic Metabolic Panel  Recent Labs  07/23/14 0515 07/23/14 1045 07/24/14 0302  NA 145  --  144  K 3.0*  --  3.6*  CL 109  --  107  CO2 27  --  24  GLUCOSE 190*  --  193*  BUN 29*  --  37*  CREATININE 0.95  --  1.43*  CALCIUM 8.4  --  8.2*  MG  --  2.0 1.8    TELE NSR with HR 70-80s     ECG  8/30 A-fib with RVR with HR 120s  Echocardiogram  LV EF: 65% - 70%  ------------------------------------------------------------------- Indications: Atrial fibrillation - 427.31.  ------------------------------------------------------------------- History: Risk factors: Hypertension. Diabetes mellitus.  ------------------------------------------------------------------- Study Conclusions  - Left ventricle: The cavity size was normal. Wall thickness was increased in a pattern of mild LVH. Systolic function was vigorous. The estimated ejection fraction was in the range of 65% to 70%. Wall motion was normal; there were no regional wall motion abnormalities. - Mitral valve: There was mild regurgitation.  Radiology/Studies  Ct Abdomen Wo Contrast  Aug 07, 2014   CLINICAL DATA:  Patient with history of perforated bowel. Reassess status post upper GI.  EXAM: CT ABDOMEN WITHOUT CONTRAST  TECHNIQUE: Multidetector CT imaging of the abdomen was performed following the standard protocol without IV contrast.  COMPARISON:  07/20/2014; 07/18/2014.  FINDINGS: Normal heart size.  Coronary arterial calcifications.  Lack of intravenous contrast material limits evaluation of the solid organ parenchyma.  Noncontrast enhanced liver and spleen are unremarkable. Suggestion of gas within the decompressed gallbladder lumen. There is atrophy of the pancreas. Adrenal glands are grossly unremarkable. Cyst within the superior pole of the right kidney. Cystic lesion within the inferior pole of the  right kidney with a peripheral thick rim of calcification. Normal caliber abdominal aorta with scattered calcified atherosclerotic plaque. 1.1 cm gastrohepatic lymph node. Additional prominent mesenteric lymph nodes are demonstrated.  A small amount of residual free intraperitoneal air is demonstrated. Oral contrast material is demonstrated throughout the visualized bowel. Adjacent to the second portion of the duodenum is a 2.5 cm collection of oral contrast without surrounding inflammatory change. There is residual inflammatory stranding about the gastric antrum and proximal duodenum.  Extensive degenerative changes of the visualized thoracolumbar spine.  IMPRESSION: Small amount of residual free intraperitoneal air compatible with history of perforated viscus. Oral contrast material is demonstrated throughout the visualized stomach, small and large bowel. Adjacent to the medial aspect of the D2 segment of the duodenum there is a focal outpouching of contrast which is favored to represent contrast within a duodenum diverticulum given the stability in appearance over the prior 2 days and no significant surrounding inflammatory change.   Electronically Signed   By: Annia Belt M.D.   On: 08/07/2014 15:55   Dg Chest 2 View  07/16/2014   CLINICAL DATA:  Larey Seat this morning.  EXAM: CHEST  2 VIEW  COMPARISON:  07-Aug-2005.  FINDINGS: Normal sized heart. Clear lungs. Thoracic spine degenerative changes. 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 No fracture or pneumothorax.  IMPRESSION: No acute abnormality.   Electronically Signed   By: Gordan Payment M.D.   On: 07/16/2014 23:34   Dg Pelvis 1-2 Views  07/16/2014   CLINICAL DATA:  Fall  EXAM: PELVIS - 1-2 VIEW  COMPARISON:  Prior radiograph from 02/01/2011  FINDINGS: Right total hip arthroplasty in place. The acetabular and femoral components articulate normally with 1 another. No acute fracture or dislocation. Bony pelvis is  intact. SI joints are approximated. No pubic dye stasis.  Prominent degenerative changes noted within the lower lumbar spine.  No soft tissue abnormality.  Osteopenia noted. Vascular calcifications present within the upper thighs.  IMPRESSION: 1. No acute fracture or dislocation. 2. Right hip arthroplasty in place without evidence of hardware complication.   Electronically Signed   By: Rise Mu M.D.   On: 07/16/2014 23:34   Dg Femur Left  07/16/2014   CLINICAL DATA:  Larey Seat at 6 a.m. this morning.  Left lower leg pain.  EXAM: LEFT FEMUR - 2 VIEW  COMPARISON:  None.  FINDINGS: There is no evidence of fracture or other focal bone lesions. Soft tissues are unremarkable. Prominent degenerative changes in the left knee. Vascular calcifications.  IMPRESSION: No acute bony abnormalities.   Electronically Signed   By: Burman Nieves M.D.   On: 07/16/2014 23:31   Dg Tibia/fibula Left  07/16/2014   CLINICAL DATA:  Left lower leg pain after a fall at 6 a.m. this morning.  EXAM: LEFT TIBIA AND FIBULA - 2 VIEW  COMPARISON:  12/31/2013  FINDINGS: Acute mostly transverse fracture of the proximal/ mid shaft of the left tibia without significant displacement. Marked degenerative changes in the left knee. Left fibula appears intact.  IMPRESSION: Fracture of the proximal/ mid shaft of the left tibia without significant displacement.   Electronically Signed   By: Burman Nieves M.D.   On: 07/16/2014 23:32   Ct Head Wo Contrast  07/23/2014   CLINICAL DATA:  Possible TIA  EXAM: CT HEAD WITHOUT CONTRAST  TECHNIQUE: Contiguous axial images were obtained from the base of the skull through the vertex without intravenous contrast.  COMPARISON:  None.  FINDINGS: Bony calvarium is intact. No findings to suggest acute hemorrhage, acute infarction or space-occupying mass lesion are noted.  IMPRESSION: No acute intracranial abnormality seen.   Electronically Signed   By: Alcide Clever M.D.   On: 07/23/2014 17:41   Ct Angio  Chest Pe W/cm &/or Wo Cm  07/18/2014   CLINICAL DATA:  Recent left lower extremity surgery. Tachycardia. Immobilization. Evaluate for pulmonary embolism.  EXAM: CT ANGIOGRAPHY CHEST WITH CONTRAST  TECHNIQUE: Multidetector CT imaging of the chest was performed using the standard protocol during bolus administration of intravenous contrast. Multiplanar CT image reconstructions and MIPs were obtained to evaluate the vascular anatomy.  CONTRAST:  70mL OMNIPAQUE IOHEXOL 350 MG/ML SOLN  COMPARISON:  Chest radiograph 07/18/2014 ; chest CT 07/22/2005  FINDINGS: Motion artifact limits evaluation for the assessment of pulmonary embolism. No evidence for central, main, lobar or segmental pulmonary embolism. Evaluation of the distal segmental and subsegmental pulmonary arteries limited due to motion artifact.  No enlarged axillary, mediastinal or hilar lymphadenopathy. Sub cm right peritracheal lymph node, stable dating back to 2006 heart is enlarged. No pericardial effusion. Coronary arterial calcifications. Aorta and main pulmonary artery are normal in caliber.  Central airways are patent. Subpleural ground-glass and consolidative opacities within the bilateral lower lobes. Additionally there are scattered patchy ground-glass opacities. There is a 1.5 cm focal area ground-glass within the right upper lobe (image 71; series 47). 5 mm subpleural ground-glass opacity within the left lower lobe (image 41; series 406). Probable trace bilateral pleural effusions.  Limited visualization of the upper abdomen demonstrates free intraperitoneal air. Atrophy of the pancreas. Small hiatal hernia.  Multilevel degenerative change of the thoracic spine.  Review of the MIP images confirms the above findings.  IMPRESSION: 1. Free intraperitoneal air is demonstrated within the upper abdomen. Recommend correlation with abdominal pelvic CT as this is concerning for perforation. 2. Evaluation for pulmonary embolism is limited secondary to motion  artifact. No evidence for central, main, lobar or proximal segmental pulmonary emboli. Evaluation of the distal segmental and subsegmental pulmonary arteries are limited due to motion artifact. 3. Bilateral lower lobe ground-glass and subpleural consolidative opacities favored represents atelectasis. Additionally scattered ground-glass pulmonary opacities 4. More focal 1.5 cm area of ground-glass attenuation within the right upper lobe. Follow-up chest CT in 3 months is recommended to assess for persistence. Critical Value/emergent results were called by telephone at the time of interpretation on 07/18/2014 at 11:27 am to Dr. Victorino Dike, who verbally acknowledged these results.   Electronically Signed   By: Annia Belt M.D.   On: 07/18/2014 11:27   Ct Abdomen Pelvis W Contrast  07/20/2014   ADDENDUM REPORT: 07/20/2014 11:50  ADDENDUM: Study discussed by telephone with Dr. Nita Sells MIKHAIL on 07/20/2014 at 1140 hrs.  The patient and her daughter are declining surgical intervention  at this time, with improving clinical symptoms since initial presentation. At this time conservative treatment is planned. The patient remains in PO. If the patient remains stable, a contrast swallow (recommend water-soluble contrast) will be obtained in the coming days.   Electronically Signed   By: Augusto Gamble M.D.   On: 07/20/2014 11:50   07/20/2014   CLINICAL DATA:  80 year old female with perforated bowel. Initial encounter.  EXAM: CT ABDOMEN AND PELVIS WITH CONTRAST  TECHNIQUE: Multidetector CT imaging of the abdomen and pelvis was performed using the standard protocol following bolus administration of intravenous contrast.  CONTRAST:  OMNIPAQUE IOHEXOL 300 MG/ML  SOLN  COMPARISON:  Chest CTA 07/18/2014.  FINDINGS: No pericardial or pleural effusion. Improved ventilation at the lung bases with decreased atelectasis.  Osteopenia. Advanced degenerative changes in the spine. Multifactorial lumbar spinal stenosis. Right hip arthroplasty  with streak artifact. No acute osseous abnormality identified.  No pelvic free fluid. Diminutive bladder. Grossly negative for age uterus and adnexa.  Negative rectum. Diverticulosis of the sigmoid colon, with no definite active inflammation. Diverticulosis continues in to the left colon and is extensive. Oral contrast appears to of reached the rectum without evidence of obstruction. No definite active inflammation in the left colon. Diverticulosis of the transverse colon, extensive in the proximal third. No active inflammation identified. Diverticulosis at the hepatic flexure is less pronounced. Negative right colon. No dilated small bowel identified.  Extensive inflammation about the distal stomach and duodenum bulb (series 2, image 27). Decompressed distal stomach. Confluent pneumoperitoneum over the liver dome, with scattered small areas of pneumoperitoneum elsewhere, including in a small fact containing umbilical hernia. No retroperitoneal gas identified in the abdomen or pelvis. Hyper enhancement of the gastric antrum and duodenum bulb amidst the inflammation. Also, the CBD is enlarged up to 12 mm diameter. There is tapering of the distal CBD, with scattered small calcifications in the pancreatic head and uncinate process superimposed on pancreatic atrophy. No definite obstructing stone or lesion identified at the distal CBD which tapers. The mid and distal duodenum then have a more normal appearance.  There is also a gas in the gallbladder which is decompressed (series 2, image 22).  Liver enhancement within normal limits. Portal venous system within normal limits. Spleen, and adrenal glands are within normal limits. Negative left kidney. Negative right kidney except for a 4 cm upper pole simple cyst and a thickly rim calcified lower pole cystic lesion measuring 21 mm diameter. No renal obstruction. No abdominal free fluid identified. Major arterial structures in the abdomen and pelvis are patent with  extensive Aortoiliac calcified atherosclerosis noted.  IMPRESSION: 1. Small volume pneumoperitoneum, most abundant in the right upper quadrant where there is confluent inflammation about the distal stomach and duodenum bulb. The constellation favors a perforated distal gastric or proximal duodenum all ulcer. There is no retroperitoneal gas. 2. Dilated CBD up to 12 mm, and gas within the gallbladder. Has this patient undergone previous sphincterotomy. There is chronic calcific pancreatitis, but no obstructing CBD etiology identified. 3. Extensive diverticulosis throughout the colon, but favor unrelated to the acute perforation.  Electronically Signed: By: Augusto Gamble M.D. On: 07/20/2014 11:14   Dg Chest Port 1 View  07/22/2014   CLINICAL DATA:  Confirm line placement  EXAM: PORTABLE CHEST - 1 VIEW  COMPARISON:  07/18/2014 CT  FINDINGS: Right PICC, with catheter tip projecting over the proximal to mid SVC. Aortic atherosclerosis. Prominent mediastinal fat. Cardiomediastinal contours otherwise within normal range. Minimal lung base  atelectasis bilaterally. Otherwise, no confluent airspace opacity, pleural effusion, or pneumothorax. Advanced bilateral shoulder degenerative changes.  IMPRESSION: Right PICC, with catheter tip projecting over the proximal to mid SVC.   Electronically Signed   By: Jearld Lesch M.D.   On: 07/22/2014 22:16   Dg Chest Port 1 View  07/18/2014   CLINICAL DATA:  Fever.  Abnormal labs.  EXAM: PORTABLE CHEST - 1 VIEW  COMPARISON:  07/16/2014  FINDINGS: Shallow inspiration with atelectasis in the lung bases. Heart size and pulmonary vascularity appear normal. No focal consolidation in the lungs. No blunting of costophrenic angles. No pneumothorax. Calcification of the aorta. Degenerative changes in the spine and shoulders.  IMPRESSION: Shallow inspiration with atelectasis in the lung bases appear   Electronically Signed   By: Burman Nieves M.D.   On: 07/18/2014 06:45   Dg Abd Portable  1v  07/17/2014   CLINICAL DATA:  Abdominal pain and vomiting.  EXAM: PORTABLE ABDOMEN - 1 VIEW  COMPARISON:  02/01/2011 and pelvis radiograph dated 07/16/2014.  FINDINGS: Normal bowel gas pattern. Stable 2.2 cm oval calcification in the region of the lower pole of the right kidney. Stable right hip prosthesis. Thoracolumbar spine degenerative changes and scoliosis.  IMPRESSION: No acute abnormality.   Electronically Signed   By: Gordan Payment M.D.   On: 07/17/2014 23:04   Dg Ugi W/water Sol Cm  07/22/2014   CLINICAL DATA:  Evaluate for perforation  EXAM: WATER SOLUBLE UPPER GI SERIES  TECHNIQUE: Single-column upper GI series was performed using water soluble contrast.  CONTRAST:  75mL OMNIPAQUE IOHEXOL 300 MG/ML  SOLN  COMPARISON:  07/20/2014  FLUOROSCOPY TIME:  1 min 22 seconds  FINDINGS: The patient ingested water-soluble contrast material. This passed through the distal esophagus and opacifies the stomach, duodenum and proximal small bowel loops. Small collection of contrast material along the D2 segment of the duodenum is favored to represent a large duodenal diverticulum. No extravasation of contrast material identified.  IMPRESSION: 1. No evidence for active extravasation of contrast material. 2. Contrast collection along the descending portion of the duodenum is favored to represent a duodenal diverticulum. This is similar to 07/20/2014.   Electronically Signed   By: Signa Kell M.D.   On: 07/22/2014 17:13    ASSESSMENT AND PLAN  HADAS JESSOP is a 80 y.o. f w/ hx HTN, DM, OA, who presented on 07/17/14 after falling w/ left tibial fracture. Her hospital course has been complicated by SIRS, AMS, a pneumoperitoneum due to a perforated ulcer and new onset afib with RVR. Cardiology was consulted for help with management of atrial fibrillation with RVR.  1. A-fib with RVR - resolved  - converted back to NSR yesterday, continue IV diltiazem while await GI to clear her to resume PO intake  -  potentially go out on 120-240 mg long active diltiazem  - a-fib exacerbated by tibial fx and anemia, fix underlying issue  - no anticoagulation with severe anemia, need to assess fall risk vs bleeding risk before discharge  2. GI bleed  - Perforated gastric vs duodenal ulcer. Sealed off by UGI series of 9/1  - per GI, continue IV protonix  - pending nuc med RBC scan result today   - hgb trended down to 7.1, getting 2 units of PRBC  3. Fall w/ L tibitial fx  4. pneumoperitoneum  5. AKI  - Cr jumped to 1.4 from 0.9, unclear cause, likely related to severe anemia  - BP appears to be  stable  - if continue to increase, may have hold tomorrow AM's IV lasix  6. AMS: episode of unresponsiveness yesterday. Transferred to 2C  - CT scan of brain negative for acute process  Signed, Amedeo Plenty Pager: [phone]   Patient seen and examined. Agree with assessment and plan. Converted to sinus rhythm, having occasional PAC's. Cr 1.43 today. Will dc iv lasix.   Lennette Bihari, MD, [facility] 07/24/2014 1:54 PM

## 2014-07-24 NOTE — Progress Notes (Signed)
Subjective: Pt denies any pain in the left leg.  Multiple medical concerns still ongoing.  Daughter at bedside.   Objective: Vital signs in last 24 hours: Temp:  [96.8 F (36 C)-99.1 F (37.3 C)] 96.8 F (36 C) (09/03 1617) Pulse Rate:  [66-96] 73 (09/03 1617) Resp:  [7-22] 16 (09/03 1617) BP: (94-144)/(40-116) 126/56 mmHg (09/03 1617) SpO2:  [100 %] 100 % (09/03 1617)  Intake/Output from previous day: 09/02 0701 - 09/03 0700 In: 2910 [I.V.:2810; IV Piggyback:100] Out: 750 [Stool:750] Intake/Output this shift: Total I/O In: 1076 [I.V.:425; Blood:651] Out: 425 [Urine:425]   Recent Labs  07/22/14 0352 07/23/14 0515 07/23/14 1045 07/23/14 1845 07/24/14 0302  HGB 12.3 9.1* 9.8* 7.9* 7.1*    Recent Labs  07/23/14 0515  07/23/14 1845 07/24/14 0302  WBC 8.6  --   --  11.6*  RBC 3.03*  --   --  2.44*  HCT 27.6*  < > 24.1* 22.0*  PLT 514*  --   --  480*  < > = values in this interval not displayed.  Recent Labs  07/23/14 0515 07/24/14 0302  NA 145 144  K 3.0* 3.6*  CL 109 107  CO2 27 24  BUN 29* 37*  CREATININE 0.95 1.43*  GLUCOSE 190* 193*  CALCIUM 8.4 8.2*   No results found for this basename: LABPT, INR,  in the last 72 hours  PE:  L LE immobilized in long leg cast.  Toes with brisk cap refill.  5/5 strength in PF and DF of the toes.  Sens to LT intact.  Assessment/Plan: L tibia fracture - continue NWB on the L LE in the [facility]  F/u with me or [name] in the office in 3 weeks.  Aspirin for DVT prophylaxis if medical condition permits, o/w ok to go without.  Ortho signing off.  Please call [phone] with any questions.   Toni Arthurs 07/24/2014, 4:26 PM

## 2014-07-24 NOTE — Progress Notes (Signed)
Daily Rounding Note  07/24/2014, 8:17 AM  LOS: 8 days   SUBJECTIVE:       4 bloody stools from 10 PM to sunrise.  Volume 75 to 200 cc each time.  No abd pain or nausea.  Feels great.  Not hungry but would love to drink some sweet tea  OBJECTIVE:         Vital signs in last 24 hours:    Temp:  [97.8 F (36.6 C)-98.5 F (36.9 C)] 98.5 F (36.9 C) (09/03 0806) Pulse Rate:  [66-118] 66 (09/03 0806) Resp:  [16-29] 17 (09/03 0806) BP: (95-138)/(42-116) 129/43 mmHg (09/03 0806) SpO2:  [100 %] 100 % (09/03 0806) Weight:  [65.2 kg (143 lb 11.8 oz)] 65.2 kg (143 lb 11.8 oz) (09/02 1432) Last BM Date: 07/23/14 General: looks well.  Comfortable , animated   Heart: Irreg, irreg.  Rate in 80s Chest: clear.  No labored breathing or cough Abdomen: soft, active BS, NT, ND.    Extremities: no CCE.  Left leg in cast Neuro/Psych:  Pleasant, alert, engaged, relaxed.  Appropriate.   Intake/Output from previous day: 09/02 0701 - 09/03 0700 In: 2910 [I.V.:2810; IV Piggyback:100] Out: 750 [Stool:750]  Intake/Output this shift:    Lab Results:  Recent Labs  07/22/14 0352 07/23/14 0515 07/23/14 1045 07/23/14 1845 07/24/14 0302  WBC 9.0 8.6  --   --  11.6*  HGB 12.3 9.1* 9.8* 7.9* 7.1*  HCT 37.4 27.6* 30.2* 24.1* 22.0*  PLT 594* 514*  --   --  480*   BMET  Recent Labs  07/22/14 0352 07/23/14 0515 07/24/14 0302  NA 146 145 144  K 2.8* 3.0* 3.6*  CL 106 109 107  CO2 GLUCOSE 156* 190* 193*  BUN 32* 29* 37*  CREATININE 0.96 0.95 1.43*  CALCIUM 9.3 8.4 8.2*   C diff negative 9/2   Studies/Results: Ct Abdomen Wo Contrast  07/22/2014   CLINICAL DATA:  Patient with history of perforated bowel. Reassess status post upper GI.  EXAM: CT ABDOMEN WITHOUT CONTRAST  TECHNIQUE: Multidetector CT imaging of the abdomen was performed following the standard protocol without IV contrast.  COMPARISON:  07/20/2014; 07/18/2014.   FINDINGS: Normal heart size.  Coronary arterial calcifications.  Lack of intravenous contrast material limits evaluation of the solid organ parenchyma.  Noncontrast enhanced liver and spleen are unremarkable. Suggestion of gas within the decompressed gallbladder lumen. There is atrophy of the pancreas. Adrenal glands are grossly unremarkable. Cyst within the superior pole of the right kidney. Cystic lesion within the inferior pole of the right kidney with a peripheral thick rim of calcification. Normal caliber abdominal aorta with scattered calcified atherosclerotic plaque. 1.1 cm gastrohepatic lymph node. Additional prominent mesenteric lymph nodes are demonstrated.  A small amount of residual free intraperitoneal air is demonstrated. Oral contrast material is demonstrated throughout the visualized bowel. Adjacent to the second portion of the duodenum is a 2.5 cm collection of oral contrast without surrounding inflammatory change. There is residual inflammatory stranding about the gastric antrum and proximal duodenum.  Extensive degenerative changes of the visualized thoracolumbar spine.  IMPRESSION: Small amount of residual free intraperitoneal air compatible with history of perforated viscus. Oral contrast material is demonstrated throughout the visualized stomach, small and large bowel. Adjacent to the medial aspect of the D2 segment of the duodenum there is a focal outpouching of contrast which is favored to represent contrast within a duodenum diverticulum given  the stability in appearance over the prior 2 days and no significant surrounding inflammatory change.   Electronically Signed   By: Annia Belt M.D.   On: 07/22/2014 15:55   Ct Head Wo Contrast  07/23/2014   CLINICAL DATA:  Possible TIA  EXAM: CT HEAD WITHOUT CONTRAST  TECHNIQUE: Contiguous axial images were obtained from the base of the skull through the vertex without intravenous contrast.  COMPARISON:  None.  FINDINGS: Bony calvarium is intact. No  findings to suggest acute hemorrhage, acute infarction or space-occupying mass lesion are noted.  IMPRESSION: No acute intracranial abnormality seen.   Electronically Signed   By: Alcide Clever M.D.   On: 07/23/2014 17:41   Dg Chest Port 1 View  07/22/2014   CLINICAL DATA:  Confirm line placement  EXAM: PORTABLE CHEST - 1 VIEW  COMPARISON:  07/18/2014 CT  FINDINGS: Right PICC, with catheter tip projecting over the proximal to mid SVC. Aortic atherosclerosis. Prominent mediastinal fat. Cardiomediastinal contours otherwise within normal range. Minimal lung base atelectasis bilaterally. Otherwise, no confluent airspace opacity, pleural effusion, or pneumothorax. Advanced bilateral shoulder degenerative changes.  IMPRESSION: Right PICC, with catheter tip projecting over the proximal to mid SVC.   Electronically Signed   By: Jearld Lesch M.D.   On: 07/22/2014 22:16   Dg Ugi W/water Sol Cm  07/22/2014   CLINICAL DATA:  Evaluate for perforation  EXAM: WATER SOLUBLE UPPER GI SERIES  TECHNIQUE: Single-column upper GI series was performed using water soluble contrast.  CONTRAST:  75mL OMNIPAQUE IOHEXOL 300 MG/ML  SOLN  COMPARISON:  07/20/2014  FLUOROSCOPY TIME:  1 min 22 seconds  FINDINGS: The patient ingested water-soluble contrast material. This passed through the distal esophagus and opacifies the stomach, duodenum and proximal small bowel loops. Small collection of contrast material along the D2 segment of the duodenum is favored to represent a large duodenal diverticulum. No extravasation of contrast material identified.  IMPRESSION: 1. No evidence for active extravasation of contrast material. 2. Contrast collection along the descending portion of the duodenum is favored to represent a duodenal diverticulum. This is similar to 07/20/2014.   Electronically Signed   By: Signa Kell M.D.   On: 07/22/2014 17:13   Scheduled Meds: . antiseptic oral rinse  7 mL Mouth Rinse BID  . furosemide  20 mg Intravenous  Daily  . levothyroxine  37.5 mcg Intravenous Daily  . pantoprazole (PROTONIX) IV  40 mg Intravenous Q12H  . piperacillin-tazobactam (ZOSYN)  IV  3.375 g Intravenous 3 times per day  . pneumococcal 23 valent vaccine  0.5 mL Intramuscular Tomorrow-1000   Continuous Infusions: . dextrose 5 % and 0.45% NaCl 75 mL/hr at 07/24/14 0609  . diltiazem (CARDIZEM) infusion 10 mg/hr (07/23/14 2052)   PRN Meds:.acetaminophen, fentaNYL, promethazine, sodium chloride   ASSESMENT:   *  Hematochezia  *  ABL anemia.   *  Perforated gastric vs duodenal ulcer.  Sealed off by UGI series of 9/1.  On BID, IV Protonix.    *  ARF  *  Tibial fracture  *  New onset rapid a fib. Rate now controlled.  *  Hypokalemia.     PLAN   *  ? If ok to allow po intake, given ulcer is sealed. Favor allowing clears.   *  Await completion of nuc med RBC scan.    Jennye Moccasin  07/24/2014, 8:17 AM Pager: [phone]  Bleeding scan is negative.  She continues to pass blood per rectum.  This could  be from her ulcer, or lower down in the GI tract.  Unfortunately EGD is out of the question because of risk for disrupting a walled off perforation.  Continue BID PPI therapy.  OK to try clears.

## 2014-07-24 NOTE — Progress Notes (Signed)
PT Cancellation Note  Patient Details Name: LIZBET CIRRINCIONE MRN: [id number] DOB: 29-Jan-1920   Cancelled Treatment:    Reason Treat Not Completed: Medical issues which prohibited therapy. Noted events of yesterday and Hgb down to 7.1 from 9.8 yesterday. Will follow and resume activity as appropriate.   Skylin Kennerson 07/24/2014, 9:06 AM Pager [phone]

## 2014-07-25 LAB — BASIC METABOLIC PANEL
Anion gap: 10 (ref 5–15)
BUN: 21 mg/dL (ref 6–23)
CO2: 26 meq/L (ref 19–32)
Calcium: 8.2 mg/dL — ABNORMAL LOW (ref 8.4–10.5)
Chloride: 105 mEq/L (ref 96–112)
Creatinine, Ser: 0.99 mg/dL (ref 0.50–1.10)
GFR calc Af Amer: 56 mL/min — ABNORMAL LOW (ref >90)
GFR calc non Af Amer: 48 mL/min — ABNORMAL LOW (ref >90)
GLUCOSE: 153 mg/dL — AB (ref 70–99)
POTASSIUM: 3 meq/L — AB (ref 3.7–5.3)
Sodium: 141 mEq/L (ref 137–147)

## 2014-07-25 LAB — TYPE AND SCREEN
ABO/RH(D): A POS
Antibody Screen: NEGATIVE
Unit division: 0
Unit division: 0

## 2014-07-25 LAB — HEMOGLOBIN AND HEMATOCRIT, BLOOD
HEMATOCRIT: 28.4 % — AB (ref 36.0–46.0)
HEMOGLOBIN: 9.6 g/dL — AB (ref 12.0–15.0)

## 2014-07-25 MED ORDER — DILTIAZEM HCL 30 MG PO TABS
30.0000 mg | ORAL_TABLET | Freq: Three times a day (TID) | ORAL | Status: DC
  Administered 2014-07-25 – 2014-07-27 (×7): 30 mg via ORAL
  Filled 2014-07-25 (×10): qty 1

## 2014-07-25 MED ORDER — LEVOTHYROXINE SODIUM 75 MCG PO TABS
75.0000 ug | ORAL_TABLET | Freq: Every day | ORAL | Status: DC
  Administered 2014-07-26 – 2014-07-28 (×3): 75 ug via ORAL
  Filled 2014-07-25 (×4): qty 1

## 2014-07-25 MED ORDER — ACETAMINOPHEN 325 MG PO TABS
650.0000 mg | ORAL_TABLET | ORAL | Status: DC | PRN
  Administered 2014-07-25 – 2014-07-27 (×5): 650 mg via ORAL
  Filled 2014-07-25 (×6): qty 2

## 2014-07-25 MED ORDER — PANTOPRAZOLE SODIUM 40 MG PO TBEC
40.0000 mg | DELAYED_RELEASE_TABLET | Freq: Two times a day (BID) | ORAL | Status: DC
  Administered 2014-07-25 – 2014-07-28 (×7): 40 mg via ORAL
  Filled 2014-07-25 (×7): qty 1

## 2014-07-25 MED ORDER — POTASSIUM CHLORIDE CRYS ER 20 MEQ PO TBCR
40.0000 meq | EXTENDED_RELEASE_TABLET | Freq: Two times a day (BID) | ORAL | Status: AC
  Administered 2014-07-25 (×2): 40 meq via ORAL
  Filled 2014-07-25 (×2): qty 2

## 2014-07-25 NOTE — Progress Notes (Deleted)
Pt. Tried to use the bedside commode but unable to void, bladder slightly distended , bladder scan done and shows >499ml. Of urine, pt. Appears uncomfortable. Explained to pt. About foley insertion and pt. verbalized understanding especially when trying to the commode very out of breath. Foley inserted per Dr. Julious Payer earlier ordered whatever pt. choice. Pt. Felt better after foley inserted and obtained 550 pale yellow urine slight sediment.

## 2014-07-25 NOTE — Progress Notes (Signed)
PT Cancellation Note  Patient Details Name: Kelli Pena MRN: [id number] DOB: 06/15/1920   Cancelled Treatment:    Reason Treat Not Completed: Medical issues which prohibited therapy.   Attempted to see this morning however pt with K+ 3.0 and bradycardia and had not at that time had her K+ supplement. On return this pm, she is sleeping so soundly that she does not arouse to tactile stimulation or loud noises. Pt snoring and allowed her to rest. Will attempt to see 9/5 as schedule permits as she has missed several days of PT due to medical issues.   Wilberth Damon 07/25/2014, 4:41 PM Pager [phone]

## 2014-07-25 NOTE — Progress Notes (Signed)
Subjective:  Feels well; to get clear liquids today.  Objective:   Vital Signs in the last 24 hours: Temp:  [96.8 F (36 C)-99.1 F (37.3 C)] 98 F (36.7 C) (09/04 0300) Pulse Rate:  [52-78] 70 (09/04 0600) Resp:  [7-29] 14 (09/04 0600) BP: (94-144)/(37-77) 132/50 mmHg (09/04 0600) SpO2:  [97 %-100 %] 100 % (09/04 0600)  Intake/Output from previous day: 09/03 0701 - 09/04 0700 In: 1576 [I.V.:850; Blood:651; IV Piggyback:75] Out: 775 [Urine:775]  Medications: . antiseptic oral rinse  7 mL Mouth Rinse BID  . levothyroxine  37.5 mcg Intravenous Daily  . pantoprazole (PROTONIX) IV  40 mg Intravenous Q12H  . piperacillin-tazobactam (ZOSYN)  IV  3.375 g Intravenous 3 times per day  . pneumococcal 23 valent vaccine  0.5 mL Intramuscular Tomorrow-1000    . dextrose 5 % and 0.45% NaCl 75 mL/hr at 07/25/14 0700  . diltiazem (CARDIZEM) infusion 5 mg/hr (07/25/14 0500)    Physical Exam:   General appearance: alert, cooperative and no distress Neck: no JVD, supple, symmetrical, trachea midline and thyroid not enlarged, symmetric, no tenderness/mass/nodules Lungs: clear to auscultation bilaterally Heart: regular rate and rhythm and 1/6 sem Abdomen: soft, non-tender; bowel sounds normal; no masses,  no organomegaly Extremities: no edema, redness or tenderness in the calves or thighs Neurologic: Grossly normal   Rate: 70  Rhythm: normal sinus rhythm  Lab Results:   Recent Labs  07/24/14 0302 07/25/14 0400  NA 144 141  K 3.6* 3.0*  CL 107 105  CO2 24 26  GLUCOSE 193* 153*  BUN 37* 21  CREATININE 1.43* 0.99   CBC Latest Ref Rng 07/25/2014 07/24/2014 07/24/2014  WBC 4.0 - 10.5 K/uL - - 11.6(H)  Hemoglobin 12.0 - 15.0 g/dL 1.9(J) 10.0(L) 7.1(L)  Hematocrit 36.0 - 46.0 % 28.4(L) 29.4(L) 22.0(L)  Platelets 150 - 400 K/uL - - 480(H)    No results found for this basename: TROPONINI, CK, MB,  in the last 72 hours  Hepatic Function Panel No results found for this basename:  PROT, ALBUMIN, AST, ALT, ALKPHOS, BILITOT, BILIDIR, IBILI,  in the last 72 hours No results found for this basename: INR,  in the last 72 hours BNP (last 3 results) No results found for this basename: PROBNP,  in the last 8760 hours  Lipid Panel  No results found for this basename: chol, trig, hdl, cholhdl, vldl, ldlcalc      Imaging:  Ct Head Wo Contrast  07/23/2014   CLINICAL DATA:  Possible TIA  EXAM: CT HEAD WITHOUT CONTRAST  TECHNIQUE: Contiguous axial images were obtained from the base of the skull through the vertex without intravenous contrast.  COMPARISON:  None.  FINDINGS: Bony calvarium is intact. No findings to suggest acute hemorrhage, acute infarction or space-occupying mass lesion are noted.  IMPRESSION: No acute intracranial abnormality seen.   Electronically Signed   By: Alcide Clever M.D.   On: 07/23/2014 17:41   Nm Gi Blood Loss  07/24/2014   CLINICAL DATA:  GI bleeding, bright red blood per rectum  EXAM: NUCLEAR MEDICINE GASTROINTESTINAL BLEEDING SCAN  TECHNIQUE: Sequential abdominal images were obtained following intravenous administration of Tc-13m labeled red blood cells.  RADIOPHARMACEUTICALS:  25 mCi Tc-23m in-vitro labeled autologous red cells.  COMPARISON:  CT abdomen and pelvis 07/22/2014  FINDINGS: Normal blood pool distribution of labeled red cells.  Small amount of de-labeled tracer within urinary tract and bladder.  No abnormal gastrointestinal localization of labeled erythrocytes identified to suggest a site active  GI bleeding through 2 hr.  IMPRESSION: Negative GI bleeding scan.   Electronically Signed   By: Ulyses Southward M.D.   On: 07/24/2014 14:57      Assessment/Plan:   Active Problems:   Fall   Unspecified hypothyroidism   Diabetes mellitus   Hypertension   Osteoarthritis   Restless leg   Tibia fracture   Atrial fibrillation   Blood in stool    80 y.o. f w/ hx HTN, DM, OA, who presented on 07/17/14 after falling w/ left tibial fracture. Her hospital  course has been complicated by SIRS, AMS, a pneumoperitoneum due to a perforated ulcer and new onset afib with RVR. Cardiology was consulted for help with management of atrial fibrillation with RVR.  1. A-fib with RVR - resolved ;  currently sinus in 60's; iv diltiazem was weaned off this am when HR decreased to 48-50 while sleeping;  When awake HR  In 60 - 70; will start oral cardizem at 30 mg orally every 8 hrs.  2. GI bleed  - Perforated gastric vs duodenal ulcer. Sealed off by UGI series of 9/1  - per GI, continue IV protonix  - Received 2 units PRBC yesterday, Hb today 9.6   3. Fall w/ L tibitial fx   4. pneumoperitoneum   5. AKI; transient increase yesterday to 1.43, lasix held, improved today to 0.99      Lennette Bihari, MD, [facility] 07/25/2014, 8:21 AM

## 2014-07-25 NOTE — Progress Notes (Signed)
Pts. Having sinus brady episode HR in the 49-50's BP stable on cardizem drip. Dr. Sharyne Richters made aware of pts. vital signs with order to wean off cardizem drip for now and may restart as needed

## 2014-07-25 NOTE — Progress Notes (Signed)
Daily Rounding Note  07/25/2014, 8:27 AM  LOS: 9 days   SUBJECTIVE:       Tolerating clears this AM, loves the ice water.  No nausea, no abd pain.   Stool overnite darker.  None yet this AM  OBJECTIVE:         Vital signs in last 24 hours:    Temp:  [96.8 F (36 C)-99.1 F (37.3 C)] 98 F (36.7 C) (09/04 0300) Pulse Rate:  [52-78] 70 (09/04 0600) Resp:  [7-29] 14 (09/04 0600) BP: (94-144)/(37-77) 132/50 mmHg (09/04 0600) SpO2:  [97 %-100 %] 100 % (09/04 0600) Last BM Date: 07/24/14 General: looks well, alert, comfortable   Heart: Irreg.  Chest: clear bil.  No cough or dyspnea Abdomen: soft, NT, ND, active BS  Extremities: no CCE Neuro/Psych:  Oriented x 3.  Relaxed, smiling and pleasant.   Intake/Output from previous day: 09/03 0701 - 09/04 0700 In: 1576 [I.V.:850; Blood:651; IV Piggyback:75] Out: 775 [Urine:775]  Intake/Output this shift:    Lab Results:  Recent Labs  07/23/14 0515  07/24/14 0302 07/24/14 2058 07/25/14 0400  WBC 8.6  --  11.6*  --   --   HGB 9.1*  < > 7.1* 10.0* 9.6*  HCT 27.6*  < > 22.0* 29.4* 28.4*  PLT 514*  --  480*  --   --   < > = values in this interval not displayed. BMET  Recent Labs  07/23/14 0515 07/24/14 0302 07/25/14 0400  NA 145 144 141  K 3.0* 3.6* 3.0*  CL 109 107 105  CO2 GLUCOSE 190* 193* 153*  BUN 29* 37* 21  CREATININE 0.95 1.43* 0.99  CALCIUM 8.4 8.2* 8.2*   LFT No results found for this basename: PROT, ALBUMIN, AST, ALT, ALKPHOS, BILITOT, BILIDIR, IBILI,  in the last 72 hours PT/INR No results found for this basename: LABPROT, INR,  in the last 72 hours Hepatitis Panel No results found for this basename: HEPBSAG, HCVAB, HEPAIGM, HEPBIGM,  in the last 72 hours  Studies/Results: Ct Head Wo Contrast  07/23/2014   CLINICAL DATA:  Possible TIA  EXAM: CT HEAD WITHOUT CONTRAST  TECHNIQUE: Contiguous axial images were obtained from the base of  the skull through the vertex without intravenous contrast.  COMPARISON:  None.  FINDINGS: Bony calvarium is intact. No findings to suggest acute hemorrhage, acute infarction or space-occupying mass lesion are noted.  IMPRESSION: No acute intracranial abnormality seen.   Electronically Signed   By: Alcide Clever M.D.   On: 07/23/2014 17:41   Nm Gi Blood Loss  07/24/2014   CLINICAL DATA:  GI bleeding, bright red blood per rectum  EXAM: NUCLEAR MEDICINE GASTROINTESTINAL BLEEDING SCAN  TECHNIQUE: Sequential abdominal images were obtained following intravenous administration of Tc-52m labeled red blood cells.  RADIOPHARMACEUTICALS:  25 mCi Tc-11m in-vitro labeled autologous red cells.  COMPARISON:  CT abdomen and pelvis 07/22/2014  FINDINGS: Normal blood pool distribution of labeled red cells.  Small amount of de-labeled tracer within urinary tract and bladder.  No abnormal gastrointestinal localization of labeled erythrocytes identified to suggest a site active GI bleeding through 2 hr.  IMPRESSION: Negative GI bleeding scan.   Electronically Signed   By: Ulyses Southward M.D.   On: 07/24/2014 14:57    ASSESMENT:   * Hematochezia.  RBC scan 9/3: negative.   * ABL anemia. Improved post transfusion 2 PRBC 9/3.  * Perforated gastric vs  duodenal ulcer. Sealed off by UGI series of 9/1. On BID, IV Protonix. No plans for EGD: due to risk of reperforation.  * ARF.  Resolved.  * Tibial fracture  * New onset rapid a fib. Rate now controlled.  * Hypokalemia. Persists. Last run of potassium was 9/2.  Po Potassium ordered for today.     PLAN   *  Will advance to carb mod diet.  *  Stop zosyn (today is day 8) *  Change to po BID Protonix.     Jennye Moccasin  07/25/2014, 8:27 AM Pager: [phone]

## 2014-07-25 NOTE — Progress Notes (Signed)
GI Attending Note  I have personally taken an interval history, reviewed the chart, and examined the patient.  I agree with the extender's note, impression and recommendations.  Robert D. Kaplan, MD, FACG [facility] [phone]  

## 2014-07-25 NOTE — Progress Notes (Signed)
[facility] PROGRESS NOTE  Kelli Pena ZOX:[id number] DOB: 1920-01-22 [name]: 07/16/2014 PCP: [name], MD Interim summary: This is a 80 yo white female with a history of HTN and DM who has been followed by ortho for several weeks with a left tibia stress fracture. She fell in her bathroom 2 days ago and had severe left leg pain. She came to [facility] and was found to have a nondisplaced left tibia fracture. She had a long cast placed and was admitted for observation for altered mental status. She was found to have intraperitoneal free air, possibly secondary to perforated duodenum or gastric ulcer. Surgical intervention on hold in view of the patient's age and overall state, and she is being treated conservatively with zosyn and NPO. On the night of 9/1 patient developed bright red blood per rectum and her hemoglobin dropped to 7 from the baseline of 11. 2 units of prbc ordered and pending to be given, repeat H&h ordered post transfusion.    Assessment/Plan: Sepsis secondary to perforated duodenal or gastric ulcer: on IV antibiotics. Leukocytosis resolved, patient afebrile .Chest x-ray shows atelectasis in the lung bases, no infiltration .Abdominal x-ray shows no acute abnormality. UA from 07/17/2014 and 8/29 negative for infection. Blood cultures show no growth to date.  Upper GI series show duodenal diverticulum.   New onset Afib with RVR: rate better controlled. On low dose of diltiazem gtt , she was weaned her off the gtt and started on oral cardizem. Aspirin and lovenox on hold for acute GI bleed.   Bright Red Blood per Rectum: new started overnight. Anticoagulation on hold. Aspirin and lovenox discontinued. IV PPI, NPO, GI consulted. H&H q6 hrs and transfuse to keep H&H >8.  Her hemoglobin dropped to 7, will order 2units of prbc done and repeat Hemoglobin is around 9. Bleeding scan is negative. Advance diet as tolerated.   Acute encephalopathy:  An episode of confusion and  unresponsiveness earlier today. Resolved in a few minutes. Borderline bp measurements, neuro exam benign. No focal deficits, no facial droop, speech normal. CT head without contrast ordered and is negative. .   Left tibia fracture: further management as per orthopedics.   Hypothyroidism resume synthroid Restless leg syndrome.  hypertension Diabetes Mellitus type 2 Acute on CKD Hypokalemia Anemia   Code Status: full code.  Family Communication: discussed with daughter over the phone Disposition Plan: pending further investigation.    Consultants:  Surgery  Orthopedics.   Cardiology  gastroenterology  Procedures:    Antibiotics:  zosyn  HPI/Subjective: Alert and talkative.   Objective: Filed Vitals:   07/25/14 1200  BP: 119/67  Pulse: 73  Temp: 98.7 F (37.1 C)  Resp: 22    Intake/Output Summary (Last 24 hours) at 07/25/14 1410 Last data filed at 07/25/14 0700  Gross per 24 hour  Intake  570.5 ml  Output    775 ml  Net -204.5 ml   [facility] Weights   07/16/14 2107 07/23/14 1432  Weight: 67.586 kg (149 lb) 65.2 kg (143 lb 11.8 oz)    Exam:   General:  Alert afebrile comfortable  Cardiovascular: s1s2  Respiratory: ctab  Abdomen: soft NT nd bs+  Musculoskeletal: no pedal edema  Data Reviewed: Basic Metabolic Panel:  Recent Labs Lab 07/20/14 0530 07/21/14 0620 07/22/14 0352 07/23/14 0515 07/23/14 1045 07/24/14 0302 07/25/14 0400  NA 142 148* 146 145  --  144 141  K 4.0 3.9 2.8* 3.0*  --  3.6* 3.0*  CL 104 109 106  109  --  107 105  CO2 --  24 26  GLUCOSE 140* 155* 156* 190*  --  193* 153*  BUN 37* 35* 32* 29*  --  37* 21  CREATININE 1.23* 0.98 0.96 0.95  --  1.43* 0.99  CALCIUM 9.4 9.3 9.3 8.4  --  8.2* 8.2*  MG  --  2.4  --   --  2.0 1.8  --   PHOS  --  3.2  --   --   --   --   --    Liver Function Tests:  Recent Labs Lab 07/19/14 0850  AST 17  ALT 13  ALKPHOS 84  BILITOT 0.6  PROT 6.3  ALBUMIN 2.7*   No  results found for this basename: LIPASE, AMYLASE,  in the last 168 hours  Recent Labs Lab 07/18/14 1620  AMMONIA 19   CBC:  Recent Labs Lab 07/20/14 0530 07/21/14 0620 07/22/14 0352 07/23/14 0515 07/23/14 1045 07/23/14 1845 07/24/14 0302 07/24/14 2058 07/25/14 0400  WBC 12.1* 9.7 9.0 8.6  --   --  11.6*  --   --   HGB 12.3 11.7* 12.3 9.1* 9.8* 7.9* 7.1* 10.0* 9.6*  HCT 37.8 35.7* 37.4 27.6* 30.2* 24.1* 22.0* 29.4* 28.4*  MCV 92.2 89.9 89.3 91.1  --   --  90.2  --   --   PLT 504* 603* 594* 514*  --   --  480*  --   --    Cardiac Enzymes: No results found for this basename: CKTOTAL, CKMB, CKMBINDEX, TROPONINI,  in the last 168 hours BNP (last 3 results) No results found for this basename: PROBNP,  in the last 8760 hours CBG:  Recent Labs Lab 07/19/14 2102 07/20/14 0646 07/20/14 2149 07/21/14 0620 07/23/14 1249  GLUCAP 130* 132* 174* 141* 174*    Recent Results (from the past 240 hour(s))  CULTURE, BLOOD (ROUTINE X 2)     Status: None   Collection Time    07/18/14  9:07 AM      Result Value Ref Range Status   Specimen Description BLOOD RIGHT HAND   Final   Special Requests BOTTLES DRAWN AEROBIC AND ANAEROBIC 10CC   Final   Culture  Setup Time     Final   Value: 07/18/2014 12:45     Performed at [facility]   Culture     Final   Value: NO GROWTH 5 DAYS     Performed at [facility]   Report Status 07/24/2014 FINAL   Final  CULTURE, BLOOD (ROUTINE X 2)     Status: None   Collection Time    07/18/14  9:20 AM      Result Value Ref Range Status   Specimen Description BLOOD LEFT HAND   Final   Special Requests     Final   Value: BOTTLES DRAWN AEROBIC AND ANAEROBIC 10CC BLUE, 5CC RED   Culture  Setup Time     Final   Value: 07/18/2014 12:45     Performed at [facility]   Culture     Final   Value: NO GROWTH 5 DAYS     Performed at [facility]   Report Status 07/24/2014 FINAL   Final  URINE CULTURE     Status: None    Collection Time    07/18/14  8:30 PM      Result Value Ref Range Status   Specimen Description URINE, CLEAN  CATCH   Final   Special Requests NONE   Final   Culture  Setup Time     Final   Value: 07/18/2014 00:38     Performed at [facility] Count     Final   Value: 3,000 COLONIES/ML     Performed at [facility]   Culture     Final   Value: INSIGNIFICANT GROWTH     Performed at [facility]   Report Status 07/20/2014 FINAL   Final  CLOSTRIDIUM DIFFICILE BY PCR     Status: None   Collection Time    07/23/14  7:20 PM      Result Value Ref Range Status   C difficile by pcr NEGATIVE  NEGATIVE Final     Studies: Ct Head Wo Contrast  07/23/2014   CLINICAL DATA:  Possible TIA  EXAM: CT HEAD WITHOUT CONTRAST  TECHNIQUE: Contiguous axial images were obtained from the base of the skull through the vertex without intravenous contrast.  COMPARISON:  None.  FINDINGS: Bony calvarium is intact. No findings to suggest acute hemorrhage, acute infarction or space-occupying mass lesion are noted.  IMPRESSION: No acute intracranial abnormality seen.   Electronically Signed   By: Alcide Clever M.D.   On: 07/23/2014 17:41   Nm Gi Blood Loss  07/24/2014   CLINICAL DATA:  GI bleeding, bright red blood per rectum  EXAM: NUCLEAR MEDICINE GASTROINTESTINAL BLEEDING SCAN  TECHNIQUE: Sequential abdominal images were obtained following intravenous administration of Tc-17m labeled red blood cells.  RADIOPHARMACEUTICALS:  25 mCi Tc-49m in-vitro labeled autologous red cells.  COMPARISON:  CT abdomen and pelvis 07/22/2014  FINDINGS: Normal blood pool distribution of labeled red cells.  Small amount of de-labeled tracer within urinary tract and bladder.  No abnormal gastrointestinal localization of labeled erythrocytes identified to suggest a site active GI bleeding through 2 hr.  IMPRESSION: Negative GI bleeding scan.   Electronically Signed   By: Ulyses Southward M.D.   On: 07/24/2014 14:57     Scheduled Meds: . antiseptic oral rinse  7 mL Mouth Rinse BID  . diltiazem  30 mg Oral 3 times per day  . [START ON 07/26/2014] levothyroxine  75 mcg Oral QAC breakfast  . pantoprazole  40 mg Oral BID  . pneumococcal 23 valent vaccine  0.5 mL Intramuscular Tomorrow-1000  . potassium chloride  40 mEq Oral BID   Continuous Infusions:    Active Problems:   Fall   Unspecified hypothyroidism   Diabetes mellitus   Hypertension   Osteoarthritis   Restless leg   Tibia fracture   Atrial fibrillation   Blood in stool    Time spent: 35 min    Carlissa Pesola  [facility] Pager [phone]. If 7PM-7AM, please contact night-coverage at [email or web address], password [facility] 07/25/2014, 2:10 PM  LOS: 9 days

## 2014-07-26 LAB — CBC
HCT: 28.2 % — ABNORMAL LOW (ref 36.0–46.0)
Hemoglobin: 9.5 g/dL — ABNORMAL LOW (ref 12.0–15.0)
MCH: 30 pg (ref 26.0–34.0)
MCHC: 33.7 g/dL (ref 30.0–36.0)
MCV: 89 fL (ref 78.0–100.0)
Platelets: 371 10*3/uL (ref 150–400)
RBC: 3.17 MIL/uL — ABNORMAL LOW (ref 3.87–5.11)
RDW: 15.3 % (ref 11.5–15.5)
WBC: 10.8 10*3/uL — ABNORMAL HIGH (ref 4.0–10.5)

## 2014-07-26 LAB — BASIC METABOLIC PANEL
Anion gap: 8 (ref 5–15)
BUN: 12 mg/dL (ref 6–23)
CO2: 25 mEq/L (ref 19–32)
Calcium: 8.2 mg/dL — ABNORMAL LOW (ref 8.4–10.5)
Chloride: 106 mEq/L (ref 96–112)
Creatinine, Ser: 0.82 mg/dL (ref 0.50–1.10)
GFR, EST AFRICAN AMERICAN: 70 mL/min — AB (ref >90)
GFR, EST NON AFRICAN AMERICAN: 60 mL/min — AB (ref >90)
GLUCOSE: 97 mg/dL (ref 70–99)
POTASSIUM: 3.6 meq/L — AB (ref 3.7–5.3)
SODIUM: 139 meq/L (ref 137–147)

## 2014-07-26 MED ORDER — POTASSIUM CHLORIDE CRYS ER 20 MEQ PO TBCR
40.0000 meq | EXTENDED_RELEASE_TABLET | Freq: Once | ORAL | Status: AC
  Administered 2014-07-26: 40 meq via ORAL
  Filled 2014-07-26: qty 2

## 2014-07-26 NOTE — Progress Notes (Signed)
Cross cover for [facility] Subjective: Patient denies having any abdominal pain, nausea or vomiting. Tolerating a regular diet well. No further evidence of rectal bleeding.  Objective: Vital signs in last 24 hours: Temp:  [97.5 F (36.4 C)-98.7 F (37.1 C)] 98.1 F (36.7 C) (09/05 0433) Pulse Rate:  [66-74] 66 (09/05 0433) Resp:  [18-22] 19 (09/05 0433) BP: (107-139)/(45-67) 139/67 mmHg (09/05 0433) SpO2:  [98 %-100 %] 98 % (09/05 0433) Last BM Date: 07/25/14  Intake/Output from previous day: 09/04 0701 - 09/05 0700 In: 120 [P.O.:120] Out: 600 [Urine:350; Stool:250] Intake/Output this shift:   General appearance: alert Resp: clear to auscultation bilaterally Cardio: regular rate and rhythm, S1, S2 normal, no murmur, click, rub or gallop GI: soft, non-tender; bowel sounds normal; no masses,  no organomegaly Extremities: extremities normal, atraumatic, no cyanosis or edema  Lab Results:  Recent Labs  07/24/14 0302 07/24/14 2058 07/25/14 0400 07/26/14 0451  WBC 11.6*  --   --  10.8*  HGB 7.1* 10.0* 9.6* 9.5*  HCT 22.0* 29.4* 28.4* 28.2*  PLT 480*  --   --  371   BMET  Recent Labs  07/24/14 0302 07/25/14 0400 07/26/14 0451  NA 144 141 139  K 3.6* 3.0* 3.6*  CL 107 105 106  CO2 GLUCOSE 193* 153* 97  BUN 37* 21 12  CREATININE 1.43* 0.99 0.82  CALCIUM 8.2* 8.2* 8.2*    Recent Labs  07/23/14 1920  CDIFFPCR NEGATIVE   Studies/Results: Nm Gi Blood Loss  07/24/2014   CLINICAL DATA:  GI bleeding, bright red blood per rectum  EXAM: NUCLEAR MEDICINE GASTROINTESTINAL BLEEDING SCAN  TECHNIQUE: Sequential abdominal images were obtained following intravenous administration of Tc-19m labeled red blood cells.  RADIOPHARMACEUTICALS:  25 mCi Tc-46m in-vitro labeled autologous red cells.  COMPARISON:  CT abdomen and pelvis 07/22/2014  FINDINGS: Normal blood pool distribution of labeled red cells.  Small amount of de-labeled tracer within urinary tract and bladder.  No  abnormal gastrointestinal localization of labeled erythrocytes identified to suggest a site active GI bleeding through 2 hr.  IMPRESSION: Negative GI bleeding scan.   Electronically Signed   By: Ulyses Southward M.D.   On: 07/24/2014 14:57   Medications: I have reviewed the patient's current medications.  Assessment/Plan: 1) ?Perforated gastric vs duodenal ulcer; rectal bleeding: currently stable. Will continue to monitor her off the anticoagulants. Continue present care.     LOS: 10 days   Kelli Pena 07/26/2014, 11:09 AM

## 2014-07-26 NOTE — Progress Notes (Signed)
   Subjective:  Feels well; to get clear liquids today. No chest pain or dyspnea  Objective:   Vital Signs in the last 24 hours: Temp:  [97.5 F (36.4 C)-98.7 F (37.1 C)] 98.1 F (36.7 C) (09/05 0433) Pulse Rate:  [66-74] 66 (09/05 0433) Resp:  [18-22] 19 (09/05 0433) BP: (107-139)/(45-67) 139/67 mmHg (09/05 0433) SpO2:  [98 %-100 %] 98 % (09/05 0433)  Intake/Output from previous day: 09/04 0701 - 09/05 0700 In: 120 [P.O.:120] Out: 600 [Urine:350; Stool:250]  Medications: . antiseptic oral rinse  7 mL Mouth Rinse BID  . diltiazem  30 mg Oral 3 times per day  . levothyroxine  75 mcg Oral QAC breakfast  . pantoprazole  40 mg Oral BID  . pneumococcal 23 valent vaccine  0.5 mL Intramuscular Tomorrow-1000  . potassium chloride  40 mEq Oral Once       Physical Exam:   General appearance: alert, cooperative and no distress Neck: no JVD, supple Lungs: clear to auscultation bilaterally Heart: RRR Abdomen: soft, non-tender; not distended Extremities: no edema Neurologic: Grossly normal   Rate: 70  Rhythm: normal sinus rhythm with pacs  Lab Results:   Recent Labs  07/25/14 0400 07/26/14 0451  NA 141 139  K 3.0* 3.6*  CL 105 106  CO2 26 25  GLUCOSE 153* 97  BUN 21 12  CREATININE 0.99 0.82   CBC Latest Ref Rng 07/26/2014 07/25/2014 07/24/2014  WBC 4.0 - 10.5 K/uL 10.8(H) - -  Hemoglobin 12.0 - 15.0 g/dL 1.6(X) 0.9(U) 10.0(L)  Hematocrit 36.0 - 46.0 % 28.2(L) 28.4(L) 29.4(L)  Platelets 150 - 400 K/uL 371 - -       Imaging:  Nm Gi Blood Loss  07/24/2014   CLINICAL DATA:  GI bleeding, bright red blood per rectum  EXAM: NUCLEAR MEDICINE GASTROINTESTINAL BLEEDING SCAN  TECHNIQUE: Sequential abdominal images were obtained following intravenous administration of Tc-64m labeled red blood cells.  RADIOPHARMACEUTICALS:  25 mCi Tc-16m in-vitro labeled autologous red cells.  COMPARISON:  CT abdomen and pelvis 07/22/2014  FINDINGS: Normal blood pool distribution of labeled  red cells.  Small amount of de-labeled tracer within urinary tract and bladder.  No abnormal gastrointestinal localization of labeled erythrocytes identified to suggest a site active GI bleeding through 2 hr.  IMPRESSION: Negative GI bleeding scan.   Electronically Signed   By: Ulyses Southward M.D.   On: 07/24/2014 14:57      Assessment/Plan:   Active Problems:   Fall   Unspecified hypothyroidism   Diabetes mellitus   Hypertension   Osteoarthritis   Restless leg   Tibia fracture   Atrial fibrillation   Blood in stool    80 y.o. f w/ hx HTN, DM, OA, who presented on 07/17/14 after falling w/ left tibial fracture. Her hospital course has been complicated by SIRS, AMS, a pneumoperitoneum due to a perforated ulcer and new onset afib with RVR. Cardiology was consulted for help with management of atrial fibrillation with RVR.  1. A-fib with RVR - resolved ;  currently sinus in 60's; continue cardizem 2. GI bleed  - Perforated gastric vs duodenal ulcer - per GI, continue IV protonix    3. Fall w/ L tibitial fx   4. pneumoperitoneum   Olga Millers, MD, [facility] 07/26/2014, 8:03 AM

## 2014-07-26 NOTE — Progress Notes (Signed)
[facility] PROGRESS NOTE  Kelli Pena UUV:[id number] DOB: Dec 22, 1919 [name]: 07/16/2014 PCP: [name], MD Interim summary: This is a 80 yo white female with a history of HTN and DM who has been followed by ortho for several weeks with a left tibia stress fracture. She fell in her bathroom 2 days ago and had severe left leg pain. She came to [facility] and was found to have a nondisplaced left tibia fracture. She had a long cast placed and was admitted for observation for altered mental status. She was found to have intraperitoneal free air, possibly secondary to perforated duodenum or gastric ulcer. Surgical intervention on hold in view of the patient's age and overall state, and she is being treated conservatively with zosyn and NPO. On the night of 9/1 patient developed bright red blood per rectum and her hemoglobin dropped to 7 from the baseline of 11. 2 units of prbc ordered and pending to be given, repeat H&h ordered post transfusion is around 9 and stable.    Assessment/Plan: Sepsis secondary to perforated duodenal or gastric ulcer: on IV antibiotics. Leukocytosis resolved, patient afebrile .Chest x-ray shows atelectasis in the lung bases, no infiltration .Abdominal x-ray shows no acute abnormality. UA from 07/17/2014 and 8/29 negative for infection. Blood cultures show no growth to date.  Upper GI series show duodenal diverticulum.   New onset Afib with RVR: rate better controlled. On low dose of diltiazem gtt , she was weaned her off the gtt and started on oral cardizem. Aspirin and lovenox on hold for acute GI bleed. We will probably start heron long acting cardizem on discharge.  Bright Red Blood per Rectum: new started the night of 9/2. Anticoagulation on hold. Aspirin and lovenox discontinued. IV PPI, NPO, GI consulted. H&H q6 hrs and transfuse to keep H&H >8.  Her hemoglobin dropped to 7, she received 2 units of prbc and repeat Hemoglobin is around 9. Bleeding scan is negative.  Advanced diet as tolerated. No more bleeding episodes.   Acute encephalopathy:  An episode of confusion and unresponsiveness earlier on 9/3. Resolved in a few minutes. Borderline bp measurements, neuro exam benign. No focal deficits, no facial droop, speech normal. CT head without contrast ordered and is negative. .   Left tibia fracture: further management as per orthopedics.   Hypothyroidism resume synthroid Restless leg syndrome.  hypertension Diabetes Mellitus type 2 Acute on CKD Hypokalemia Anemia; anemia of blood loss: s/p 2 units of prbc and anemia panel sent.    Code Status: full code.  Family Communication: discussed with daughter over the phone Disposition Plan: pending further investigation.    Consultants:  Surgery  Orthopedics.   Cardiology  gastroenterology  Procedures:  Bleeding scan.   Antibiotics:  zosyn  HPI/Subjective: Alert and talkative. Comfortable. No pain. No nausea or vomiting.   Objective: Filed Vitals:   07/26/14 0433  BP: 139/67  Pulse: 66  Temp: 98.1 F (36.7 C)  Resp: 19    Intake/Output Summary (Last 24 hours) at 07/26/14 1650 Last data filed at 07/26/14 1159  Gross per 24 hour  Intake    240 ml  Output    350 ml  Net   -110 ml   [facility] Weights   07/16/14 2107 07/23/14 1432  Weight: 67.586 kg (149 lb) 65.2 kg (143 lb 11.8 oz)    Exam:   General:  Alert afebrile comfortable  Cardiovascular: s1s2  Respiratory: ctab  Abdomen: soft NT nd bs+  Musculoskeletal: no pedal edema  Data  Reviewed: Basic Metabolic Panel:  Recent Labs Lab 07/20/14 0530 07/21/14 0620 07/22/14 0352 07/23/14 0515 07/23/14 1045 07/24/14 0302 07/25/14 0400 07/26/14 0451  NA 142 148* 146 145  --  144 141 139  K 4.0 3.9 2.8* 3.0*  --  3.6* 3.0* 3.6*  CL 104 109 106 109  --  107 105 106  CO2 --  GLUCOSE 140* 155* 156* 190*  --  193* 153* 97  BUN 37* 35* 32* 29*  --  37* 21 12  CREATININE 1.23* 0.98 0.96 0.95   --  1.43* 0.99 0.82  CALCIUM 9.4 9.3 9.3 8.4  --  8.2* 8.2* 8.2*  MG  --  2.4  --   --  2.0 1.8  --   --   PHOS  --  3.2  --   --   --   --   --   --    Liver Function Tests: No results found for this basename: AST, ALT, ALKPHOS, BILITOT, PROT, ALBUMIN,  in the last 168 hours No results found for this basename: LIPASE, AMYLASE,  in the last 168 hours No results found for this basename: AMMONIA,  in the last 168 hours CBC:  Recent Labs Lab 07/21/14 0620 07/22/14 0352 07/23/14 0515  07/23/14 1845 07/24/14 0302 07/24/14 2058 07/25/14 0400 07/26/14 0451  WBC 9.7 9.0 8.6  --   --  11.6*  --   --  10.8*  HGB 11.7* 12.3 9.1*  < > 7.9* 7.1* 10.0* 9.6* 9.5*  HCT 35.7* 37.4 27.6*  < > 24.1* 22.0* 29.4* 28.4* 28.2*  MCV 89.9 89.3 91.1  --   --  90.2  --   --  89.0  PLT 603* 594* 514*  --   --  480*  --   --  371  < > = values in this interval not displayed. Cardiac Enzymes: No results found for this basename: CKTOTAL, CKMB, CKMBINDEX, TROPONINI,  in the last 168 hours BNP (last 3 results) No results found for this basename: PROBNP,  in the last 8760 hours CBG:  Recent Labs Lab 07/19/14 2102 07/20/14 0646 07/20/14 2149 07/21/14 0620 07/23/14 1249  GLUCAP 130* 132* 174* 141* 174*    Recent Results (from the past 240 hour(s))  CULTURE, BLOOD (ROUTINE X 2)     Status: None   Collection Time    07/18/14  9:07 AM      Result Value Ref Range Status   Specimen Description BLOOD RIGHT HAND   Final   Special Requests BOTTLES DRAWN AEROBIC AND ANAEROBIC 10CC   Final   Culture  Setup Time     Final   Value: 07/18/2014 12:45     Performed at [facility]   Culture     Final   Value: NO GROWTH 5 DAYS     Performed at [facility]   Report Status 07/24/2014 FINAL   Final  CULTURE, BLOOD (ROUTINE X 2)     Status: None   Collection Time    07/18/14  9:20 AM      Result Value Ref Range Status   Specimen Description BLOOD LEFT HAND   Final   Special Requests     Final    Value: BOTTLES DRAWN AEROBIC AND ANAEROBIC 10CC BLUE, 5CC RED   Culture  Setup Time     Final   Value: 07/18/2014 12:45     Performed at [facility]   Culture     Final   Value: NO GROWTH 5 DAYS     Performed at [facility]   Report Status 07/24/2014 FINAL   Final  URINE CULTURE     Status: None   Collection Time    07/18/14  8:30 PM      Result Value Ref Range Status   Specimen Description URINE, CLEAN CATCH   Final   Special Requests NONE   Final   Culture  Setup Time     Final   Value: 07/18/2014 00:38     Performed at [facility] Count     Final   Value: 3,000 COLONIES/ML     Performed at [facility]   Culture     Final   Value: INSIGNIFICANT GROWTH     Performed at [facility]   Report Status 07/20/2014 FINAL   Final  CLOSTRIDIUM DIFFICILE BY PCR     Status: None   Collection Time    07/23/14  7:20 PM      Result Value Ref Range Status   C difficile by pcr NEGATIVE  NEGATIVE Final     Studies: No results found.  Scheduled Meds: . antiseptic oral rinse  7 mL Mouth Rinse BID  . diltiazem  30 mg Oral 3 times per day  . levothyroxine  75 mcg Oral QAC breakfast  . pantoprazole  40 mg Oral BID  . pneumococcal 23 valent vaccine  0.5 mL Intramuscular Tomorrow-1000   Continuous Infusions:    Active Problems:   Fall   Unspecified hypothyroidism   Diabetes mellitus   Hypertension   Osteoarthritis   Restless leg   Tibia fracture   Atrial fibrillation   Blood in stool    Time spent: 35 min    Danel Studzinski  [facility] Pager [phone]. If 7PM-7AM, please contact night-coverage at [email or web address], password [facility] 07/26/2014, 4:50 PM  LOS: 10 days

## 2014-07-27 LAB — BASIC METABOLIC PANEL
Anion gap: 10 (ref 5–15)
BUN: 10 mg/dL (ref 6–23)
CALCIUM: 8.5 mg/dL (ref 8.4–10.5)
CHLORIDE: 102 meq/L (ref 96–112)
CO2: 24 meq/L (ref 19–32)
Creatinine, Ser: 0.75 mg/dL (ref 0.50–1.10)
GFR calc Af Amer: 83 mL/min — ABNORMAL LOW (ref >90)
GFR calc non Af Amer: 71 mL/min — ABNORMAL LOW (ref >90)
GLUCOSE: 130 mg/dL — AB (ref 70–99)
Potassium: 3.8 mEq/L (ref 3.7–5.3)
SODIUM: 136 meq/L — AB (ref 137–147)

## 2014-07-27 LAB — CBC
HCT: 31.4 % — ABNORMAL LOW (ref 36.0–46.0)
HEMOGLOBIN: 10.6 g/dL — AB (ref 12.0–15.0)
MCH: 29.4 pg (ref 26.0–34.0)
MCHC: 33.8 g/dL (ref 30.0–36.0)
MCV: 87.2 fL (ref 78.0–100.0)
Platelets: 457 10*3/uL — ABNORMAL HIGH (ref 150–400)
RBC: 3.6 MIL/uL — AB (ref 3.87–5.11)
RDW: 15.2 % (ref 11.5–15.5)
WBC: 12.2 10*3/uL — AB (ref 4.0–10.5)

## 2014-07-27 LAB — FERRITIN: Ferritin: 201 ng/mL (ref 10–291)

## 2014-07-27 LAB — IRON AND TIBC
Iron: 18 ug/dL — ABNORMAL LOW (ref 42–135)
SATURATION RATIOS: 8 % — AB (ref 20–55)
TIBC: 231 ug/dL — AB (ref 250–470)
UIBC: 213 ug/dL (ref 125–400)

## 2014-07-27 LAB — RETICULOCYTES
RBC.: 3.6 MIL/uL — ABNORMAL LOW (ref 3.87–5.11)
Retic Count, Absolute: 194.4 10*3/uL — ABNORMAL HIGH (ref 19.0–186.0)
Retic Ct Pct: 5.4 % — ABNORMAL HIGH (ref 0.4–3.1)

## 2014-07-27 LAB — VITAMIN B12: Vitamin B-12: 373 pg/mL (ref 211–911)

## 2014-07-27 LAB — FOLATE: Folate: 18.4 ng/mL

## 2014-07-27 MED ORDER — GABAPENTIN 600 MG PO TABS
600.0000 mg | ORAL_TABLET | Freq: Every day | ORAL | Status: DC
  Administered 2014-07-27: 600 mg via ORAL
  Filled 2014-07-27 (×2): qty 1

## 2014-07-27 MED ORDER — DILTIAZEM HCL ER COATED BEADS 120 MG PO CP24
120.0000 mg | ORAL_CAPSULE | Freq: Every day | ORAL | Status: DC
  Administered 2014-07-27 – 2014-07-28 (×2): 120 mg via ORAL
  Filled 2014-07-27 (×2): qty 1

## 2014-07-27 NOTE — Progress Notes (Signed)
   Subjective:  Complains of restlest legs. No chest pain or dyspnea  Objective:   Vital Signs in the last 24 hours: Temp:  [97.4 F (36.3 C)-98.8 F (37.1 C)] 98.8 F (37.1 C) (09/06 0351) Pulse Rate:  [78-90] 90 (09/06 0351) Resp:  [18-20] 18 (09/06 0351) BP: (117-136)/(23-44) 117/23 mmHg (09/06 0351) SpO2:  [98 %-100 %] 98 % (09/06 0351)  Intake/Output from previous day: 09/05 0701 - 09/06 0700 In: 360 [P.O.:360] Out: -   Medications: . antiseptic oral rinse  7 mL Mouth Rinse BID  . diltiazem  30 mg Oral 3 times per day  . levothyroxine  75 mcg Oral QAC breakfast  . pantoprazole  40 mg Oral BID  . pneumococcal 23 valent vaccine  0.5 mL Intramuscular Tomorrow-1000       Physical Exam:   General appearance: alert, cooperative and no distress Neck: no JVD, supple Lungs: clear to auscultation bilaterally Heart: RRR Abdomen: soft, non-tender; not distended Extremities: no edema; cast in place Neurologic: Grossly normal   Rate: 70  Rhythm: normal sinus rhythm with pacs; brief PAF.  Lab Results:   Recent Labs  07/26/14 0451 07/27/14 0551  NA 139 136*  K 3.6* 3.8  CL 106 102  CO2 25 24  GLUCOSE 97 130*  BUN 12 10  CREATININE 0.82 0.75   CBC Latest Ref Rng 07/27/2014 07/26/2014 07/25/2014  WBC 4.0 - 10.5 K/uL 12.2(H) 10.8(H) -  Hemoglobin 12.0 - 15.0 g/dL 10.6(L) 9.5(L) 9.6(L)  Hematocrit 36.0 - 46.0 % 31.4(L) 28.2(L) 28.4(L)  Platelets 150 - 400 K/uL 457(H) 371 -       Imaging:  No results found.    Assessment/Plan:   Active Problems:   Fall   Unspecified hypothyroidism   Diabetes mellitus   Hypertension   Osteoarthritis   Restless leg   Tibia fracture   Atrial fibrillation   Blood in stool    79 y.o. f w/ hx HTN, DM, OA, who presented on 07/17/14 after falling w/ left tibial fracture. Her hospital course has been complicated by SIRS, AMS, a pneumoperitoneum due to a perforated ulcer and new onset afib with RVR. Cardiology was consulted for  help with management of atrial fibrillation with RVR.  1. A-fib with RVR - intermittent atrial fibrillation noted on telemetry; rate controlled ;  Change cardizem to CD 120 mg daily; follow HR; no anticoagulation given recent GI bleed. 2. GI bleed  - Perforated gastric vs duodenal ulcer - per GI, continue IV protonix    3. Fall w/ L tibitial fx   4. pneumoperitoneum   Olga Millers, MD, [facility] 07/27/2014, 9:27 AM

## 2014-07-27 NOTE — Progress Notes (Signed)
UR Completed.  Kelli Pena [phone] 07/27/2014  

## 2014-07-27 NOTE — Progress Notes (Signed)
Cross cover [facility] Subjective: Since I last evaluated the patient, she seems to be doing well. She is tolerating her diet well without any abdominal pain, nausea or vomiting. No evidence of recent GI bleeding.   Objective: Vital signs in last 24 hours: Temp:  [97.4 F (36.3 C)-98.8 F (37.1 C)] 98.8 F (37.1 C) (09/06 0351) Pulse Rate:  [78-90] 90 (09/06 0351) Resp:  [18-20] 18 (09/06 0351) BP: (117-136)/(23-44) 117/23 mmHg (09/06 0351) SpO2:  [98 %-100 %] 98 % (09/06 0351) Last BM Date: 07/25/14  Intake/Output from previous day: 09/05 0701 - 09/06 0700 In: 360 [P.O.:360] Out: -  Intake/Output this shift:   General appearance: alert, cooperative, appears stated age and no distress Resp: clear to auscultation bilaterally Cardio: irregular rate and rhythm, S1, S2 normal, no murmur, click, rub or gallop GI: soft, non-tender; bowel sounds normal; no masses,  no organomegaly Lab Results:  Recent Labs  07/25/14 0400 07/26/14 0451 07/27/14 0551  WBC  --  10.8* 12.2*  HGB 9.6* 9.5* 10.6*  HCT 28.4* 28.2* 31.4*  PLT  --  371 457*   BMET  Recent Labs  07/25/14 0400 07/26/14 0451 07/27/14 0551  NA 141 139 136*  K 3.0* 3.6* 3.8  CL 105 106 102  CO2 GLUCOSE 153* 97 130*  BUN CREATININE 0.99 0.82 0.75  CALCIUM 8.2* 8.2* 8.5   Medications: I have reviewed the patient's current medications.  Assessment/Plan: 1) Perforated gastric ulcer-walled off-noted incidentally-currently stable after having a drop in hemoglobin with rectal bleeding. Continue present care.    2) Atrial fibrillation with rate controlled with oral Cardiazem now.  3) S/P Left tibial stress fracture. 4) HTN.  5) Diabetes mellitus.  6) Hypothyroidism.   LOS: 11 days   Kelli Pena 07/27/2014, 7:34 AM

## 2014-07-27 NOTE — Progress Notes (Signed)
[facility] PROGRESS NOTE  Kelli Pena RUE:[id number] DOB: 01-15-1920 [name]: 07/16/2014 PCP: [name], MD Interim summary: This is a 80 yo white female with a history of HTN and DM who has been followed by ortho for several weeks with a left tibia stress fracture. She fell in her bathroom 2 days ago and had severe left leg pain. She came to [facility] and was found to have a nondisplaced left tibia fracture. She had a long cast placed and was admitted for observation for altered mental status. She was found to have intraperitoneal free air, possibly secondary to perforated duodenum or gastric ulcer. Surgical intervention on hold in view of the patient's age and overall state, and she is being treated conservatively with zosyn and NPO. On the night of 9/1 patient developed bright red blood per rectum and her hemoglobin dropped to 7 from the baseline of 11. 2 units of prbc ordered and pending to be given, repeat H&h ordered post transfusion is around 9 and stable.    Assessment/Plan: Sepsis secondary to perforated duodenal or gastric ulcer: on IV antibiotics. Leukocytosis resolved, patient afebrile .Chest x-ray shows atelectasis in the lung bases, no infiltration .Abdominal x-ray shows no acute abnormality. UA from 07/17/2014 and 8/29 negative for infection. Blood cultures show no growth to date.  Upper GI series show duodenal diverticulum.   New onset Afib with RVR: rate better controlled. On low dose of diltiazem gtt , she was weaned her off the gtt and started on oral cardizem. Aspirin and lovenox on hold for acute GI bleed. We will probably start heron long acting cardizem on discharge.  Bright Red Blood per Rectum: new started the night of 9/2. Anticoagulation on hold. Aspirin and lovenox discontinued. IV PPI, NPO, GI consulted. H&H q6 hrs and transfuse to keep H&H >8.  Her hemoglobin dropped to 7, she received 2 units of prbc and repeat Hemoglobin is around 9. Bleeding scan is negative.  Advanced diet as tolerated. No more bleeding episodes.   Acute encephalopathy:  An episode of confusion and unresponsiveness earlier on 9/3. Resolved in a few minutes. Borderline bp measurements, neuro exam benign. No focal deficits, no facial droop, speech normal. CT head without contrast ordered and is negative. .   Left tibia fracture: further management as per orthopedics.   Hypothyroidism resume synthroid Restless leg syndrome.  hypertension Diabetes Mellitus type 2 Acute on CKD Hypokalemia Anemia; anemia of blood loss: s/p 2 units of prbc and anemia panel sent.   Leukocytosis: -unclear etiology. Continue to monitor.    Code Status: full code.  Family Communication: discussed with daughter over the phone Disposition Plan: SNF when bed available.   Consultants:  Surgery  Orthopedics.   Cardiology  gastroenterology  Procedures:  Bleeding scan.   Antibiotics:  zosyn  HPI/Subjective: Alert and talkative. Comfortable. No pain. No nausea or vomiting.   Objective: Filed Vitals:   07/27/14 1324  BP: 127/76  Pulse: 79  Temp: 97.6 F (36.4 C)  Resp: 19    Intake/Output Summary (Last 24 hours) at 07/27/14 1600 Last data filed at 07/27/14 1300  Gross per 24 hour  Intake    480 ml  Output      0 ml  Net    480 ml   [facility] Weights   07/16/14 2107 07/23/14 1432  Weight: 67.586 kg (149 lb) 65.2 kg (143 lb 11.8 oz)    Exam:   General:  Alert afebrile comfortable  Cardiovascular: s1s2  Respiratory: ctab  Abdomen:  soft NT nd bs+  Musculoskeletal: no pedal edema  Data Reviewed: Basic Metabolic Panel:  Recent Labs Lab 07/21/14 0620  07/23/14 0515 07/23/14 1045 07/24/14 0302 07/25/14 0400 07/26/14 0451 07/27/14 0551  NA 148*  < > 145  --  144 141 139 136*  K 3.9  < > 3.0*  --  3.6* 3.0* 3.6* 3.8  CL 109  < > 109  --  107 105 106 102  CO2 22  < > 27  --  GLUCOSE 155*  < > 190*  --  193* 153* 97 130*  BUN 35*  < > 29*  --  37* CREATININE 0.98  < > 0.95  --  1.43* 0.99 0.82 0.75  CALCIUM 9.3  < > 8.4  --  8.2* 8.2* 8.2* 8.5  MG 2.4  --   --  2.0 1.8  --   --   --   PHOS 3.2  --   --   --   --   --   --   --   < > = values in this interval not displayed. Liver Function Tests: No results found for this basename: AST, ALT, ALKPHOS, BILITOT, PROT, ALBUMIN,  in the last 168 hours No results found for this basename: LIPASE, AMYLASE,  in the last 168 hours No results found for this basename: AMMONIA,  in the last 168 hours CBC:  Recent Labs Lab 07/22/14 0352 07/23/14 0515  07/24/14 0302 07/24/14 2058 07/25/14 0400 07/26/14 0451 07/27/14 0551  WBC 9.0 8.6  --  11.6*  --   --  10.8* 12.2*  HGB 12.3 9.1*  < > 7.1* 10.0* 9.6* 9.5* 10.6*  HCT 37.4 27.6*  < > 22.0* 29.4* 28.4* 28.2* 31.4*  MCV 89.3 91.1  --  90.2  --   --  89.0 87.2  PLT 594* 514*  --  480*  --   --  371 457*  < > = values in this interval not displayed. Cardiac Enzymes: No results found for this basename: CKTOTAL, CKMB, CKMBINDEX, TROPONINI,  in the last 168 hours BNP (last 3 results) No results found for this basename: PROBNP,  in the last 8760 hours CBG:  Recent Labs Lab 07/20/14 2149 07/21/14 0620 07/23/14 1249  GLUCAP 174* 141* 174*    Recent Results (from the past 240 hour(s))  CULTURE, BLOOD (ROUTINE X 2)     Status: None   Collection Time    07/18/14  9:07 AM      Result Value Ref Range Status   Specimen Description BLOOD RIGHT HAND   Final   Special Requests BOTTLES DRAWN AEROBIC AND ANAEROBIC 10CC   Final   Culture  Setup Time     Final   Value: 07/18/2014 12:45     Performed at [facility]   Culture     Final   Value: NO GROWTH 5 DAYS     Performed at [facility]   Report Status 07/24/2014 FINAL   Final  CULTURE, BLOOD (ROUTINE X 2)     Status: None   Collection Time    07/18/14  9:20 AM      Result Value Ref Range Status   Specimen Description BLOOD LEFT HAND   Final   Special Requests      Final   Value: BOTTLES DRAWN AEROBIC AND ANAEROBIC 10CC BLUE, 5CC RED   Culture  Setup Time  Final   Value: 07/18/2014 12:45     Performed at [facility]   Culture     Final   Value: NO GROWTH 5 DAYS     Performed at [facility]   Report Status 07/24/2014 FINAL   Final  URINE CULTURE     Status: None   Collection Time    07/18/14  8:30 PM      Result Value Ref Range Status   Specimen Description URINE, CLEAN CATCH   Final   Special Requests NONE   Final   Culture  Setup Time     Final   Value: 07/18/2014 00:38     Performed at [facility] Count     Final   Value: 3,000 COLONIES/ML     Performed at [facility]   Culture     Final   Value: INSIGNIFICANT GROWTH     Performed at [facility]   Report Status 07/20/2014 FINAL   Final  CLOSTRIDIUM DIFFICILE BY PCR     Status: None   Collection Time    07/23/14  7:20 PM      Result Value Ref Range Status   C difficile by pcr NEGATIVE  NEGATIVE Final     Studies: No results found.  Scheduled Meds: . antiseptic oral rinse  7 mL Mouth Rinse BID  . diltiazem  120 mg Oral Daily  . levothyroxine  75 mcg Oral QAC breakfast  . pantoprazole  40 mg Oral BID  . pneumococcal 23 valent vaccine  0.5 mL Intramuscular Tomorrow-1000   Continuous Infusions:    Active Problems:   Fall   Unspecified hypothyroidism   Diabetes mellitus   Hypertension   Osteoarthritis   Restless leg   Tibia fracture   Atrial fibrillation   Blood in stool    Time spent: 35 min    Shajuana Mclucas  [facility] Pager [phone]. If 7PM-7AM, please contact night-coverage at [email or web address], password [facility] 07/27/2014, 4:00 PM  LOS: 11 days

## 2014-07-27 NOTE — Clinical Social Work Note (Signed)
CSW continuing to follow and assist with discharge planning needs.  Marcelline Deist, MSW, [facility] Licensed Clinical Social Worker [phone] and [phone]

## 2014-07-28 MED ORDER — DILTIAZEM HCL ER COATED BEADS 120 MG PO CP24: 120.0000 mg | ORAL_CAPSULE | Freq: Every day | ORAL | Status: DC

## 2014-07-28 MED ORDER — PANTOPRAZOLE SODIUM 40 MG PO TBEC: 40.0000 mg | DELAYED_RELEASE_TABLET | Freq: Two times a day (BID) | ORAL | Status: DC

## 2014-07-28 NOTE — Discharge Summary (Signed)
Physician Discharge Summary  Kelli Pena RUE:[id number] DOB: 05/11/1920 [name]: 07/16/2014  PCP: Kelli Jubilee, MD  Admit date: 07/16/2014 Discharge date: 07/28/2014  Time spent: 30 minutes  Recommendations for Outpatient Follow-up:  Follow up with [name] or [name] in the office in 3 weeks. Follow up with cardiology as needed.  Follow up with gastroenterology as needed or if bleeding re occurs.     Discharge Diagnoses:  Active Problems:   Fall   Unspecified hypothyroidism   Diabetes mellitus   Hypertension   Osteoarthritis   Restless leg   Tibia fracture   Atrial fibrillation   Blood in stool   Discharge Condition: improved  Diet recommendation: carb modified diet.   Filed Weights   07/16/14 2107 07/23/14 1432  Weight: 67.586 kg (149 lb) 65.2 kg (143 lb 11.8 oz)    History of present illness:  This is a 80 yo white female with a history of HTN and DM who has been followed by ortho for several weeks with a left tibia stress fracture. She fell in her bathroom 2 days ago and had severe left leg pain. She came to [facility] and was found to have a nondisplaced left tibia fracture. She had a long cast placed and was admitted for observation for altered mental status. She was found to have intraperitoneal free air, possibly secondary to perforated duodenum or gastric ulcer. Surgical intervention on hold in view of the patient's age and overall state, and she is being treated conservatively with zosyn and NPO. On the night of 9/1 patient developed bright red blood per rectum and her hemoglobin dropped to 7 from the baseline of 11. 2 units of prbc  given, repeat H&h ordered post transfusion is around 9 and stable. She also developed a fib with RVR and her rate is better controlled at 8 and is on cardizem. She is not on anticoagulation secondary to gi bleed.    Hospital Course:  Sepsis secondary to perforated duodenal or gastric ulcer: on IV antibiotics. Leukocytosis resolved, patient  afebrile .Chest x-ray shows atelectasis in the lung bases, no infiltration .Abdominal x-ray shows no acute abnormality. UA from 07/17/2014 and 8/29 negative for infection. Blood cultures show no growth to date. Upper GI series show duodenal diverticulum.  New onset Afib with RVR: rate better controlled. On low dose of diltiazem gtt , she was weaned her off the gtt and started on oral cardizem. Aspirin and lovenox on hold for acute GI bleed. We will probably start heron long acting cardizem on discharge.  Bright Red Blood per Rectum: new started the night of 9/2. Anticoagulation on hold. Aspirin and lovenox discontinued. IV PPI, NPO, GI consulted. H&H q6 hrs and transfuse to keep H&H >8. Her hemoglobin dropped to 7, she received 2 units of prbc and repeat Hemoglobin is around 9. Bleeding scan is negative. Advanced diet as tolerated. No more bleeding episodes.  Acute encephalopathy: An episode of confusion and unresponsiveness earlier on 9/3. Resolved in a few minutes. Borderline bp measurements, neuro exam benign. No focal deficits, no facial droop, speech normal. CT head without contrast ordered and is negative. .  Left tibia fracture: further management as per orthopedics. NWB on the LLE. Hypothyroidism resume synthroid  Restless leg syndrome.  hypertension  Diabetes Mellitus type 2  Acute on CKD : resolved.  Hypokalemia repleted as needed.  Anemia; anemia of blood loss: s/p 2 units of prbc and anemia panel sent.  Leukocytosis:  -unclear etiology.  Repeat in am  Procedures: Bleeding scan.    Consultations: Surgery  Orthopedics.  Cardiology  gastroenterology   Discharge Exam: Filed Vitals:   07/28/14 0916  BP: 116/55  Pulse:   Temp:   Resp:     General: alert afebrile comfortable Cardiovascular: s1s2 Respiratory: ctab  Discharge Instructions You were cared for by a hospitalist during your hospital stay. If you have any questions about your discharge medications or the care  you received while you were in the hospital after you are discharged, you can call the unit and asked to speak with the hospitalist on call if the hospitalist that took care of you is not available. Once you are discharged, your primary care physician will handle any further medical issues. Please note that NO REFILLS for any discharge medications will be authorized once you are discharged, as it is imperative that you return to your primary care physician (or establish a relationship with a primary care physician if you do not have one) for your aftercare needs so that they can reassess your need for medications and monitor your lab values.   Current Discharge Medication List    START taking these medications   Details  diltiazem (CARDIZEM CD) 120 MG 24 hr capsule Take 1 capsule (120 mg total) by mouth daily.    pantoprazole (PROTONIX) 40 MG tablet Take 1 tablet (40 mg total) by mouth 2 (two) times daily.      CONTINUE these medications which have NOT CHANGED   Details  diclofenac sodium (VOLTAREN) 1 % GEL Apply 4 g topically 4 (four) times daily as needed (for pain).     DULoxetine (CYMBALTA) 60 MG capsule Take 60 mg by mouth every morning.     furosemide (LASIX) 40 MG tablet Take 40 mg by mouth daily.    gabapentin (NEURONTIN) 600 MG tablet Take 600 mg by mouth at bedtime as needed (for pain).     HYDROcodone-acetaminophen (NORCO/VICODIN) 5-325 MG per tablet Take 1 tablet by mouth every 6 (six) hours as needed for moderate pain.     levothyroxine (SYNTHROID, LEVOTHROID) 75 MCG tablet Take 75 mcg by mouth daily before breakfast.    lidocaine (LIDODERM) 5 % Place 3 patches onto the skin daily as needed (for pain). Apply to both knees and right shoulder. Remove & Discard patch within 12 hours or as directed by MD    Multiple Vitamin (MULTIVITAMIN) tablet Take 1 tablet by mouth daily.    polyethylene glycol (MIRALAX / GLYCOLAX) packet Take 4-17 g by mouth daily as needed (constipation).     POTASSIUM PO Take 1 tablet by mouth daily.     Vitamin D, Ergocalciferol, (DRISDOL) 50000 UNITS CAPS capsule Take 50,000 Units by mouth every 7 (seven) days. Saturday.      STOP taking these medications     aspirin 325 MG tablet      irbesartan (AVAPRO) 150 MG tablet      piroxicam (FELDENE) 20 MG capsule      rOPINIRole (REQUIP) 4 MG tablet      sulfamethoxazole-trimethoprim (BACTRIM DS) 800-160 MG per tablet      traMADol (ULTRAM) 50 MG tablet        Allergies  Allergen Reactions  . Tetanus Toxoids Swelling      The results of significant diagnostics from this hospitalization (including imaging, microbiology, ancillary and laboratory) are listed below for reference.    Significant Diagnostic Studies: Ct Abdomen Wo Contrast  2014/08/01   CLINICAL DATA:  Patient with history of perforated bowel.  Reassess status post upper GI.  EXAM: CT ABDOMEN WITHOUT CONTRAST  TECHNIQUE: Multidetector CT imaging of the abdomen was performed following the standard protocol without IV contrast.  COMPARISON:  07/20/2014; 07/18/2014.  FINDINGS: Normal heart size.  Coronary arterial calcifications.  Lack of intravenous contrast material limits evaluation of the solid organ parenchyma.  Noncontrast enhanced liver and spleen are unremarkable. Suggestion of gas within the decompressed gallbladder lumen. There is atrophy of the pancreas. Adrenal glands are grossly unremarkable. Cyst within the superior pole of the right kidney. Cystic lesion within the inferior pole of the right kidney with a peripheral thick rim of calcification. Normal caliber abdominal aorta with scattered calcified atherosclerotic plaque. 1.1 cm gastrohepatic lymph node. Additional prominent mesenteric lymph nodes are demonstrated.  A small amount of residual free intraperitoneal air is demonstrated. Oral contrast material is demonstrated throughout the visualized bowel. Adjacent to the second portion of the duodenum is a 2.5 cm  collection of oral contrast without surrounding inflammatory change. There is residual inflammatory stranding about the gastric antrum and proximal duodenum.  Extensive degenerative changes of the visualized thoracolumbar spine.  IMPRESSION: Small amount of residual free intraperitoneal air compatible with history of perforated viscus. Oral contrast material is demonstrated throughout the visualized stomach, small and large bowel. Adjacent to the medial aspect of the D2 segment of the duodenum there is a focal outpouching of contrast which is favored to represent contrast within a duodenum diverticulum given the stability in appearance over the prior 2 days and no significant surrounding inflammatory change.   Electronically Signed   By: Annia Belt M.D.   On: 07/22/2014 15:55   Dg Chest 2 View  07/16/2014   CLINICAL DATA:  Larey Seat this morning.  EXAM: CHEST  2 VIEW  COMPARISON:  07/22/2005.  FINDINGS: Normal sized heart. Clear lungs. Thoracic spine degenerative changes. 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 No fracture or pneumothorax.  IMPRESSION: No acute abnormality.   Electronically Signed   By: Gordan Payment M.D.   On: 07/16/2014 23:34   Dg Pelvis 1-2 Views  07/16/2014   CLINICAL DATA:  Fall  EXAM: PELVIS - 1-2 VIEW  COMPARISON:  Prior radiograph from 02/01/2011  FINDINGS: Right total hip arthroplasty in place. The acetabular and femoral components articulate normally with 1 another. No acute fracture or dislocation. Bony pelvis is intact. SI joints are approximated. No pubic dye stasis.  Prominent degenerative changes noted within the lower lumbar spine.  No soft tissue abnormality.  Osteopenia noted. Vascular calcifications present within the upper thighs.  IMPRESSION: 1. No acute fracture or dislocation. 2. Right hip arthroplasty in place without evidence of hardware complication.   Electronically Signed   By: Rise Mu M.D.   On: 07/16/2014 23:34    Dg Femur Left  07/16/2014   CLINICAL DATA:  Larey Seat at 6 a.m. this morning.  Left lower leg pain.  EXAM: LEFT FEMUR - 2 VIEW  COMPARISON:  None.  FINDINGS: There is no evidence of fracture or other focal bone lesions. Soft tissues are unremarkable. Prominent degenerative changes in the left knee. Vascular calcifications.  IMPRESSION: No acute bony abnormalities.   Electronically Signed   By: Burman Nieves M.D.   On: 07/16/2014 23:31   Dg Tibia/fibula Left  07/16/2014   CLINICAL DATA:  Left lower leg pain after a fall at 6 a.m. this morning.  EXAM: LEFT TIBIA AND FIBULA - 2 VIEW  COMPARISON:  12/31/2013  FINDINGS: Acute mostly transverse fracture  of the proximal/ mid shaft of the left tibia without significant displacement. Marked degenerative changes in the left knee. Left fibula appears intact.  IMPRESSION: Fracture of the proximal/ mid shaft of the left tibia without significant displacement.   Electronically Signed   By: Burman Nieves M.D.   On: 07/16/2014 23:32   Ct Head Wo Contrast  07/23/2014   CLINICAL DATA:  Possible TIA  EXAM: CT HEAD WITHOUT CONTRAST  TECHNIQUE: Contiguous axial images were obtained from the base of the skull through the vertex without intravenous contrast.  COMPARISON:  None.  FINDINGS: Bony calvarium is intact. No findings to suggest acute hemorrhage, acute infarction or space-occupying mass lesion are noted.  IMPRESSION: No acute intracranial abnormality seen.   Electronically Signed   By: Alcide Clever M.D.   On: 07/23/2014 17:41   Ct Angio Chest Pe W/cm &/or Wo Cm  07/18/2014   CLINICAL DATA:  Recent left lower extremity surgery. Tachycardia. Immobilization. Evaluate for pulmonary embolism.  EXAM: CT ANGIOGRAPHY CHEST WITH CONTRAST  TECHNIQUE: Multidetector CT imaging of the chest was performed using the standard protocol during bolus administration of intravenous contrast. Multiplanar CT image reconstructions and MIPs were obtained to evaluate the vascular anatomy.   CONTRAST:  70mL OMNIPAQUE IOHEXOL 350 MG/ML SOLN  COMPARISON:  Chest radiograph 07/18/2014 ; chest CT 07/22/2005  FINDINGS: Motion artifact limits evaluation for the assessment of pulmonary embolism. No evidence for central, main, lobar or segmental pulmonary embolism. Evaluation of the distal segmental and subsegmental pulmonary arteries limited due to motion artifact.  No enlarged axillary, mediastinal or hilar lymphadenopathy. Sub cm right peritracheal lymph node, stable dating back to 2006 heart is enlarged. No pericardial effusion. Coronary arterial calcifications. Aorta and main pulmonary artery are normal in caliber.  Central airways are patent. Subpleural ground-glass and consolidative opacities within the bilateral lower lobes. Additionally there are scattered patchy ground-glass opacities. There is a 1.5 cm focal area ground-glass within the right upper lobe (image 71; series 47). 5 mm subpleural ground-glass opacity within the left lower lobe (image 41; series 406). Probable trace bilateral pleural effusions.  Limited visualization of the upper abdomen demonstrates free intraperitoneal air. Atrophy of the pancreas. Small hiatal hernia.  Multilevel degenerative change of the thoracic spine.  Review of the MIP images confirms the above findings.  IMPRESSION: 1. Free intraperitoneal air is demonstrated within the upper abdomen. Recommend correlation with abdominal pelvic CT as this is concerning for perforation. 2. Evaluation for pulmonary embolism is limited secondary to motion artifact. No evidence for central, main, lobar or proximal segmental pulmonary emboli. Evaluation of the distal segmental and subsegmental pulmonary arteries are limited due to motion artifact. 3. Bilateral lower lobe ground-glass and subpleural consolidative opacities favored represents atelectasis. Additionally scattered ground-glass pulmonary opacities 4. More focal 1.5 cm area of ground-glass attenuation within the right upper  lobe. Follow-up chest CT in 3 months is recommended to assess for persistence. Critical Value/emergent results were called by telephone at the time of interpretation on 07/18/2014 at 11:27 am to Dr. Victorino Dike, who verbally acknowledged these results.   Electronically Signed   By: Annia Belt M.D.   On: 07/18/2014 11:27   Nm Gi Blood Loss  07/24/2014   CLINICAL DATA:  GI bleeding, bright red blood per rectum  EXAM: NUCLEAR MEDICINE GASTROINTESTINAL BLEEDING SCAN  TECHNIQUE: Sequential abdominal images were obtained following intravenous administration of Tc-41m labeled red blood cells.  RADIOPHARMACEUTICALS:  25 mCi Tc-30m in-vitro labeled autologous red cells.  COMPARISON:  CT  abdomen and pelvis 07/22/2014  FINDINGS: Normal blood pool distribution of labeled red cells.  Small amount of de-labeled tracer within urinary tract and bladder.  No abnormal gastrointestinal localization of labeled erythrocytes identified to suggest a site active GI bleeding through 2 hr.  IMPRESSION: Negative GI bleeding scan.   Electronically Signed   By: Ulyses Southward M.D.   On: 07/24/2014 14:57   Ct Abdomen Pelvis W Contrast  07/20/2014   ADDENDUM REPORT: 07/20/2014 11:50  ADDENDUM: Study discussed by telephone with Dr. Nita Sells MIKHAIL on 07/20/2014 at 1140 hrs.  The patient and her daughter are declining surgical intervention at this time, with improving clinical symptoms since initial presentation. At this time conservative treatment is planned. The patient remains in PO. If the patient remains stable, a contrast swallow (recommend water-soluble contrast) will be obtained in the coming days.   Electronically Signed   By: Augusto Gamble M.D.   On: 07/20/2014 11:50   07/20/2014   CLINICAL DATA:  80 year old female with perforated bowel. Initial encounter.  EXAM: CT ABDOMEN AND PELVIS WITH CONTRAST  TECHNIQUE: Multidetector CT imaging of the abdomen and pelvis was performed using the standard protocol following bolus administration of  intravenous contrast.  CONTRAST:  OMNIPAQUE IOHEXOL 300 MG/ML  SOLN  COMPARISON:  Chest CTA 07/18/2014.  FINDINGS: No pericardial or pleural effusion. Improved ventilation at the lung bases with decreased atelectasis.  Osteopenia. Advanced degenerative changes in the spine. Multifactorial lumbar spinal stenosis. Right hip arthroplasty with streak artifact. No acute osseous abnormality identified.  No pelvic free fluid. Diminutive bladder. Grossly negative for age uterus and adnexa.  Negative rectum. Diverticulosis of the sigmoid colon, with no definite active inflammation. Diverticulosis continues in to the left colon and is extensive. Oral contrast appears to of reached the rectum without evidence of obstruction. No definite active inflammation in the left colon. Diverticulosis of the transverse colon, extensive in the proximal third. No active inflammation identified. Diverticulosis at the hepatic flexure is less pronounced. Negative right colon. No dilated small bowel identified.  Extensive inflammation about the distal stomach and duodenum bulb (series 2, image 27). Decompressed distal stomach. Confluent pneumoperitoneum over the liver dome, with scattered small areas of pneumoperitoneum elsewhere, including in a small fact containing umbilical hernia. No retroperitoneal gas identified in the abdomen or pelvis. Hyper enhancement of the gastric antrum and duodenum bulb amidst the inflammation. Also, the CBD is enlarged up to 12 mm diameter. There is tapering of the distal CBD, with scattered small calcifications in the pancreatic head and uncinate process superimposed on pancreatic atrophy. No definite obstructing stone or lesion identified at the distal CBD which tapers. The mid and distal duodenum then have a more normal appearance.  There is also a gas in the gallbladder which is decompressed (series 2, image 22).  Liver enhancement within normal limits. Portal venous system within normal limits. Spleen,  and adrenal glands are within normal limits. Negative left kidney. Negative right kidney except for a 4 cm upper pole simple cyst and a thickly rim calcified lower pole cystic lesion measuring 21 mm diameter. No renal obstruction. No abdominal free fluid identified. Major arterial structures in the abdomen and pelvis are patent with extensive Aortoiliac calcified atherosclerosis noted.  IMPRESSION: 1. Small volume pneumoperitoneum, most abundant in the right upper quadrant where there is confluent inflammation about the distal stomach and duodenum bulb. The constellation favors a perforated distal gastric or proximal duodenum all ulcer. There is no retroperitoneal gas. 2. Dilated CBD up  to 12 mm, and gas within the gallbladder. Has this patient undergone previous sphincterotomy. There is chronic calcific pancreatitis, but no obstructing CBD etiology identified. 3. Extensive diverticulosis throughout the colon, but favor unrelated to the acute perforation.  Electronically Signed: By: Augusto Gamble M.D. On: 07/20/2014 11:14   Dg Chest Port 1 View  07/22/2014   CLINICAL DATA:  Confirm line placement  EXAM: PORTABLE CHEST - 1 VIEW  COMPARISON:  07/18/2014 CT  FINDINGS: Right PICC, with catheter tip projecting over the proximal to mid SVC. Aortic atherosclerosis. Prominent mediastinal fat. Cardiomediastinal contours otherwise within normal range. Minimal lung base atelectasis bilaterally. Otherwise, no confluent airspace opacity, pleural effusion, or pneumothorax. Advanced bilateral shoulder degenerative changes.  IMPRESSION: Right PICC, with catheter tip projecting over the proximal to mid SVC.   Electronically Signed   By: Jearld Lesch M.D.   On: 07/22/2014 22:16   Dg Chest Port 1 View  07/18/2014   CLINICAL DATA:  Fever.  Abnormal labs.  EXAM: PORTABLE CHEST - 1 VIEW  COMPARISON:  07/16/2014  FINDINGS: Shallow inspiration with atelectasis in the lung bases. Heart size and pulmonary vascularity appear normal. No  focal consolidation in the lungs. No blunting of costophrenic angles. No pneumothorax. Calcification of the aorta. Degenerative changes in the spine and shoulders.  IMPRESSION: Shallow inspiration with atelectasis in the lung bases appear   Electronically Signed   By: Burman Nieves M.D.   On: 07/18/2014 06:45   Dg Abd Portable 1v  07/17/2014   CLINICAL DATA:  Abdominal pain and vomiting.  EXAM: PORTABLE ABDOMEN - 1 VIEW  COMPARISON:  02/01/2011 and pelvis radiograph dated 07/16/2014.  FINDINGS: Normal bowel gas pattern. Stable 2.2 cm oval calcification in the region of the lower pole of the right kidney. Stable right hip prosthesis. Thoracolumbar spine degenerative changes and scoliosis.  IMPRESSION: No acute abnormality.   Electronically Signed   By: Gordan Payment M.D.   On: 07/17/2014 23:04   Dg Ugi W/water Sol Cm  07/22/2014   CLINICAL DATA:  Evaluate for perforation  EXAM: WATER SOLUBLE UPPER GI SERIES  TECHNIQUE: Single-column upper GI series was performed using water soluble contrast.  CONTRAST:  75mL OMNIPAQUE IOHEXOL 300 MG/ML  SOLN  COMPARISON:  07/20/2014  FLUOROSCOPY TIME:  1 min 22 seconds  FINDINGS: The patient ingested water-soluble contrast material. This passed through the distal esophagus and opacifies the stomach, duodenum and proximal small bowel loops. Small collection of contrast material along the D2 segment of the duodenum is favored to represent a large duodenal diverticulum. No extravasation of contrast material identified.  IMPRESSION: 1. No evidence for active extravasation of contrast material. 2. Contrast collection along the descending portion of the duodenum is favored to represent a duodenal diverticulum. This is similar to 07/20/2014.   Electronically Signed   By: Signa Kell M.D.   On: 07/22/2014 17:13    Microbiology: Recent Results (from the past 240 hour(s))  URINE CULTURE     Status: None   Collection Time    07/18/14  8:30 PM      Result Value Ref Range Status    Specimen Description URINE, CLEAN CATCH   Final   Special Requests NONE   Final   Culture  Setup Time     Final   Value: 07/18/2014 00:38     Performed at [facility] Count     Final   Value: 3,000 COLONIES/ML     Performed at [facility]   Culture     Final   Value: INSIGNIFICANT GROWTH     Performed at [facility]   Report Status 07/20/2014 FINAL   Final  CLOSTRIDIUM DIFFICILE BY PCR     Status: None   Collection Time    07/23/14  7:20 PM      Result Value Ref Range Status   C difficile by pcr NEGATIVE  NEGATIVE Final     Labs: Basic Metabolic Panel:  Recent Labs Lab 07/23/14 0515 07/23/14 1045 07/24/14 0302 07/25/14 0400 07/26/14 0451 07/27/14 0551  NA 145  --  144 141 139 136*  K 3.0*  --  3.6* 3.0* 3.6* 3.8  CL 109  --  107 105 106 102  CO2 27  --  GLUCOSE 190*  --  193* 153* 97 130*  BUN 29*  --  37* CREATININE 0.95  --  1.43* 0.99 0.82 0.75  CALCIUM 8.4  --  8.2* 8.2* 8.2* 8.5  MG  --  2.0 1.8  --   --   --    Liver Function Tests: No results found for this basename: AST, ALT, ALKPHOS, BILITOT, PROT, ALBUMIN,  in the last 168 hours No results found for this basename: LIPASE, AMYLASE,  in the last 168 hours No results found for this basename: AMMONIA,  in the last 168 hours CBC:  Recent Labs Lab 07/22/14 0352 07/23/14 0515  07/24/14 0302 07/24/14 2058 07/25/14 0400 07/26/14 0451 07/27/14 0551  WBC 9.0 8.6  --  11.6*  --   --  10.8* 12.2*  HGB 12.3 9.1*  < > 7.1* 10.0* 9.6* 9.5* 10.6*  HCT 37.4 27.6*  < > 22.0* 29.4* 28.4* 28.2* 31.4*  MCV 89.3 91.1  --  90.2  --   --  89.0 87.2  PLT 594* 514*  --  480*  --   --  371 457*  < > = values in this interval not displayed. Cardiac Enzymes: No results found for this basename: CKTOTAL, CKMB, CKMBINDEX, TROPONINI,  in the last 168 hours BNP: BNP (last 3 results) No results found for this basename: PROBNP,  in the last 8760 hours CBG:  Recent  Labs Lab 07/23/14 1249  GLUCAP 174*       Signed:  Andy Moye  [facility] 07/28/2014, 12:20 PM

## 2014-07-28 NOTE — Progress Notes (Signed)
Subjective:  No CP/SOB  Objective:  Temp:  [97.6 F (36.4 C)-98.6 F (37 C)] 97.8 F (36.6 C) (09/07 0339) Pulse Rate:  [73-85] 85 (09/07 0339) Resp:  [16-19] 16 (09/07 0339) BP: (106-138)/(37-76) 106/37 mmHg (09/07 0339) SpO2:  [100 %] 100 % (09/07 0339) Weight change:   Intake/Output from previous day: 09/06 0701 - 09/07 0700 In: 620 [P.O.:600; I.V.:20] Out: -   Intake/Output from this shift:    Physical Exam: General appearance: alert and no distress Neck: no adenopathy, no carotid bruit, no JVD, supple, symmetrical, trachea midline and thyroid not enlarged, symmetric, no tenderness/mass/nodules Lungs: clear to auscultation bilaterally Heart: irregularly irregular rhythm Extremities: S/P LLE FX, in cast  Lab Results: Results for orders placed during the hospital encounter of 07/16/14 (from the past 48 hour(s))  CBC     Status: Abnormal   Collection Time    07/27/14  5:51 AM      Result Value Ref Range   WBC 12.2 (*) 4.0 - 10.5 K/uL   RBC 3.60 (*) 3.87 - 5.11 MIL/uL   Hemoglobin 10.6 (*) 12.0 - 15.0 g/dL   HCT 31.4 (*) 36.0 - 46.0 %   MCV 87.2  78.0 - 100.0 fL   MCH 29.4  26.0 - 34.0 pg   MCHC 33.8  30.0 - 36.0 g/dL   RDW 15.2  11.5 - 15.5 %   Platelets 457 (*) 150 - 400 K/uL  BASIC METABOLIC PANEL     Status: Abnormal   Collection Time    07/27/14  5:51 AM      Result Value Ref Range   Sodium 136 (*) 137 - 147 mEq/L   Potassium 3.8  3.7 - 5.3 mEq/L   Chloride 102  96 - 112 mEq/L   CO2 24  19 - 32 mEq/L   Glucose, Bld 130 (*) 70 - 99 mg/dL   BUN 10  6 - 23 mg/dL   Creatinine, Ser 0.75  0.50 - 1.10 mg/dL   Calcium 8.5  8.4 - 10.5 mg/dL   GFR calc non Af Amer 71 (*) >90 mL/min   GFR calc Af Amer 83 (*) >90 mL/min   Comment: (NOTE)     The eGFR has been calculated using the CKD EPI equation.     This calculation has not been validated in all clinical situations.     eGFR's persistently <90 mL/min signify possible Chronic Kidney     Disease.   Anion gap 10  5 - 15  VITAMIN B12     Status: None   Collection Time    07/27/14  5:51 AM      Result Value Ref Range   Vitamin B-12 373  211 - 911 pg/mL   Comment: Performed at [place]     Status: None   Collection Time    07/27/14  5:51 AM      Result Value Ref Range   Folate 18.4     Comment: (NOTE)     Reference Ranges            Deficient:       0.4 - 3.3 ng/mL            Indeterminate:   3.4 - 5.4 ng/mL            Normal:              > 5.4 ng/mL     Performed at [facility]  [facility]     Status: Abnormal   Collection Time    07/27/14  5:51 AM      Result Value Ref Range   Iron 18 (*) 42 - 135 ug/dL   TIBC 231 (*) 250 - 470 ug/dL   Saturation Ratios 8 (*) 20 - 55 %   UIBC 213  125 - 400 ug/dL   Comment: Performed at [place]     Status: None   Collection Time    07/27/14  5:51 AM      Result Value Ref Range   Ferritin 201  10 - 291 ng/mL   Comment: Performed at [place]     Status: Abnormal   Collection Time    07/27/14  5:51 AM      Result Value Ref Range   Retic Ct Pct 5.4 (*) 0.4 - 3.1 %   RBC. 3.60 (*) 3.87 - 5.11 MIL/uL   Retic Count, Manual 194.4 (*) 19.0 - 186.0 K/uL    Imaging: Imaging results have been reviewed  Assessment/Plan:   1. Active Problems: 2.   Fall 3.   Unspecified hypothyroidism 4.   Diabetes mellitus 5.   Hypertension 6.   Osteoarthritis 7.   Restless leg 8.   Tibia fracture 9.   Atrial fibrillation 10.   Blood in stool 11.   Time Spent Directly with Patient:  15 minutes  Length of Stay:  LOS: 12 days   We have been following for AFIB. Pt admitted for tibial Fx. She is not an anticoagulation candidate secondary to fall risk and ? Perforated ulcer. She is rate controlled on cardizem CD 120 mg daily. Will need physical Rx. No further recommendations . Will see again PRN   Dewitt Judice J 07/28/2014, 8:17 AM

## 2014-07-28 NOTE — Progress Notes (Signed)
07/28/2014 2:16 PM Nursing note Report called to [facility]. Questions and concerns addressed. Pt. D/c SNF via ambulance transport.  Damaria Stofko, Blanchard Kelch

## 2014-07-28 NOTE — Progress Notes (Addendum)
Cross cover [facility] Subjective: Since I last evaluated the patient, she seems to be relatively stable. No further problems with GI bleeding. Tolerating her diet well.  Objective: Vital signs in last 24 hours: Temp:  [97.6 F (36.4 C)-98.6 F (37 C)] 97.8 F (36.6 C) (09/07 0339) Pulse Rate:  [73-85] 85 (09/07 0339) Resp:  [16-19] 16 (09/07 0339) BP: (106-138)/(37-76) 106/37 mmHg (09/07 0339) SpO2:  [100 %] 100 % (09/07 0339) Last BM Date: 07/27/14  Intake/Output from previous day: 09/06 0701 - 09/07 0700 In: 620 [P.O.:600; I.V.:20] Out: -  Intake/Output this shift:    General appearance: alert, cooperative, appears stated age and no distress Resp: clear to auscultation bilaterally Cardio: irregularly irregular rate and rhythm, S1, S2 normal, no murmur, click, rub or gallop GI: soft, non-tender; bowel sounds normal; no masses,  no organomegaly  Lab Results:  Recent Labs  07/26/14 0451 07/27/14 0551  WBC 10.8* 12.2*  HGB 9.5* 10.6*  HCT 28.2* 31.4*  PLT 371 457*   BMET  Recent Labs  07/26/14 0451 07/27/14 0551  NA 139 136*  K 3.6* 3.8  CL 106 102  CO2 25 24  GLUCOSE 97 130*  BUN 12 10  CREATININE 0.82 0.75  CALCIUM 8.2* 8.5   Studies/Results: No results found.  Medications: I have reviewed the patient's current medications.  Assessment/Plan: 1) ?Perforated gastric vs duodenal ulcer-seems stable at the present time.  2) Rectal bleeding on anticoagulants-that have since been held.  3) Atrial fibrillation.  4) Tibial fracture-left-being followed by ortho.  LOS: 12 days   Brailey Buescher 07/28/2014, 8:59 AM

## 2014-07-29 ENCOUNTER — Non-Acute Institutional Stay (SKILLED_NURSING_FACILITY): Payer: Medicare Other | Admitting: Internal Medicine

## 2014-07-29 ENCOUNTER — Other Ambulatory Visit: Payer: Self-pay | Admitting: *Deleted

## 2014-07-29 DIAGNOSIS — E039 Hypothyroidism, unspecified: Secondary | ICD-10-CM

## 2014-07-29 DIAGNOSIS — S82202D Unspecified fracture of shaft of left tibia, subsequent encounter for closed fracture with routine healing: Secondary | ICD-10-CM

## 2014-07-29 DIAGNOSIS — D62 Acute posthemorrhagic anemia: Secondary | ICD-10-CM

## 2014-07-29 DIAGNOSIS — E1142 Type 2 diabetes mellitus with diabetic polyneuropathy: Secondary | ICD-10-CM

## 2014-07-29 DIAGNOSIS — E1140 Type 2 diabetes mellitus with diabetic neuropathy, unspecified: Secondary | ICD-10-CM

## 2014-07-29 DIAGNOSIS — S8290XD Unspecified fracture of unspecified lower leg, subsequent encounter for closed fracture with routine healing: Secondary | ICD-10-CM

## 2014-07-29 DIAGNOSIS — E1149 Type 2 diabetes mellitus with other diabetic neurological complication: Secondary | ICD-10-CM

## 2014-07-29 MED ORDER — HYDROCODONE-ACETAMINOPHEN 5-325 MG PO TABS: 1.0000 | ORAL_TABLET | Freq: Four times a day (QID) | ORAL | Status: DC | PRN

## 2014-07-29 NOTE — Telephone Encounter (Signed)
[facility] 

## 2014-07-31 NOTE — Progress Notes (Signed)
HISTORY & PHYSICAL  DATE: 07/29/2014   FACILITY: [facility]  LEVEL OF CARE: SNF (31)  ALLERGIES:  Allergies  Allergen Reactions  . Tetanus Toxoids Swelling    CHIEF COMPLAINT:  Manage left tibia fracture, acute blood loss anemia and hypothyroidism  HISTORY OF PRESENT ILLNESS: Patient is a 80 year old Caucasian female.  LEFT TIBIA FX: Patient had a fall and sustained a nondisplaced left tibia fracture. She had a long cast placed. She is admitted to this facility for short-term rehabilitation. She denies left leg pain. She is tolerating her pain medications without any side effects.  ANEMIA: The anemia has been stable. The patient denies fatigue, melena or hematochezia. No complications from the medications currently being used. Patient had bright red blood per rectum and her hemoglobin dropped to 7 from 11. She received 2 units of packed red blood cells after which hemoglobin came to 9.  HYPOTHYROIDISM: The hypothyroidism remains stable. No complications noted from the medications presently being used.  The patient denies fatigue or constipation.  Last TSH not available.  PAST MEDICAL HISTORY :  Past Medical History  Diagnosis Date  . Hypertension   . Diabetes mellitus without complication   . Arthritis   . Hypothyroid   . Fibromyalgia   . Complication of anesthesia     trouble waking up, very cold, hypotension  . Atrial fibrillation     PAST SURGICAL HISTORY: Past Surgical History  Procedure Laterality Date  . Hip surgery    . Cholecystectomy  1980s?    open chole  . Eye surgery      SOCIAL HISTORY:  reports that she quit smoking about 48 years ago. Her smoking use included Cigarettes. She has a 30 pack-year smoking history. She has never used smokeless tobacco. She reports that she drinks alcohol. She reports that she does not use illicit drugs.  FAMILY HISTORY:  Family History  Problem Relation Age of Onset  . Stroke Father   .  Arthritis Sister   . Arthritis Daughter     CURRENT MEDICATIONS: Reviewed per MAR/see medication list  REVIEW OF SYSTEMS:  See HPI otherwise 14 point ROS is negative.  PHYSICAL EXAMINATION  VS:  See VS section  GENERAL: no acute distress, moderately obese body habitus EYES: conjunctivae normal, sclerae normal, normal eye lids MOUTH/THROAT: lips without lesions,no lesions in the mouth,tongue is without lesions,uvula elevates in midline NECK: supple, trachea midline, no neck masses, no thyroid tenderness, no thyromegaly LYMPHATICS: no LAN in the neck, no supraclavicular LAN RESPIRATORY: breathing is even & unlabored, BS CTAB CARDIAC: RRR, no murmur,no extra heart sounds, no edema GI:  ABDOMEN: abdomen soft, normal BS, no masses, no tenderness  LIVER/SPLEEN: no hepatomegaly, no splenomegaly MUSCULOSKELETAL: HEAD: normal to inspection  EXTREMITIES: LEFT UPPER EXTREMITY: full range of motion, normal strength & tone RIGHT UPPER EXTREMITY:  full range of motion, normal strength & tone LEFT LOWER EXTREMITY:  Not tested-has cast RIGHT LOWER EXTREMITY: Moderate range of motion, normal strength & tone PSYCHIATRIC: the patient is alert & oriented to person, affect & behavior appropriate  LABS/RADIOLOGY:  Labs reviewed: Basic Metabolic Panel:  Recent Labs  16/10/96 0530 07/21/14 0620  07/23/14 1045 07/24/14 0302 07/25/14 0400 07/26/14 0451 07/27/14 0551  NA 142 148*  < >  --  144 141 139 136*  K 4.0 3.9  < >  --  3.6* 3.0* 3.6* 3.8  CL 104 109  < >  --  107  105 106 102  CO2 19 22  < >  --  GLUCOSE 140* 155*  < >  --  193* 153* 97 130*  BUN 37* 35*  < >  --  37* CREATININE 1.23* 0.98  < >  --  1.43* 0.99 0.82 0.75  CALCIUM 9.4 9.3  < >  --  8.2* 8.2* 8.2* 8.5  MG  --  2.4  --  2.0 1.8  --   --   --   PHOS  --  3.2  --   --   --   --   --   --   < > = values in this interval not displayed. Liver Function Tests:  Recent Labs  04/07/14 1601  07/17/14 0012 07/19/14 0850  AST ALT ALKPHOS 94 92 84  BILITOT 0.3 0.4 0.6  PROT 6.4 6.2 6.3  ALBUMIN 4.0 3.2* 2.7*    Recent Labs  07/18/14 1620  AMMONIA 19   CBC:  Recent Labs  04/07/14 1601 07/17/14 0012  07/24/14 0302  07/25/14 0400 07/26/14 0451 07/27/14 0551  WBC 9.3 9.4  < > 11.6*  --   --  10.8* 12.2*  NEUTROABS 6.2 7.6  --   --   --   --   --   --   HGB 12.2 11.5*  < > 7.1*  < > 9.6* 9.5* 10.6*  HCT 36.9 34.5*  < > 22.0*  < > 28.4* 28.2* 31.4*  MCV 88.5 90.1  < > 90.2  --   --  89.0 87.2  PLT 433* 387  < > 480*  --   --  371 457*  < > = values in this interval not displayed.  CBG:  Recent Labs  07/20/14 2149 07/21/14 0620 07/23/14 1249  GLUCAP 174* 141* 174*    Transthoracic Echocardiography  Patient:    Ezell, Melikian MR #:       [id number] Study Date: 07/23/2014 Gender:     F Age:        89 Height:     149.9 cm Weight:     67.6 kg BSA:        1.7 m^2 Pt. Status: Room:       [id number]   Lattie Corns [id number]  [id number]    AOZHY, QMVHQI [address] Nita Sells [address]    Edsel Petrin [id number]  PERFORMING   [facility], Inpatient  SONOGRAPHER  Letta Kocher  cc:  ------------------------------------------------------------------- LV EF: 65% -   70%  ------------------------------------------------------------------- Indications:      Atrial fibrillation - 427.31.  ------------------------------------------------------------------- History:   Risk factors:  Hypertension. Diabetes mellitus.  ------------------------------------------------------------------- Study Conclusions  - Left ventricle: The cavity size was normal. Wall thickness was   increased in a pattern of mild LVH. Systolic function was   vigorous. The estimated ejection fraction was in the range of 65%   to 70%. Wall motion was normal; there were no regional wall   motion abnormalities. - Mitral valve: There was mild  regurgitation.  Transthoracic echocardiography.  M-mode, complete 2D, spectral Doppler, and color Doppler.  Birthdate:  Patient birthdate: Mar 28, 1920.  Age:  Patient is 80 yr old.  Sex:  Gender: female. BMI: 30.1 kg/m^2.  Blood pressure:     139/85  Patient status: Inpatient.  Study date:  Study date: 07/23/2014.  Study time: 07:56 AM.  Location:  Bedside.  -------------------------------------------------------------------  ------------------------------------------------------------------- Left ventricle:  The cavity size was normal. Wall thickness was increased in a pattern of mild LVH. Systolic function was vigorous. The estimated ejection fraction was in the range of 65% to 70%. Wall motion was normal; there were no regional wall motion abnormalities.  ------------------------------------------------------------------- Aortic valve:   Structurally normal valve.   Cusp separation was normal.  Doppler:  Transvalvular velocity was within the normal range. There was no stenosis. There was no regurgitation.  ------------------------------------------------------------------- Aorta:  Aortic root: The aortic root was normal in size. Ascending aorta: The ascending aorta was normal in size.  ------------------------------------------------------------------- Mitral valve:   Structurally normal valve.   Leaflet separation was normal.  Doppler:  Transvalvular velocity was within the normal range. There was no evidence for stenosis. There was mild regurgitation.    Peak gradient (D): 3 mm Hg.  ------------------------------------------------------------------- Left atrium:  The atrium was normal in size.  ------------------------------------------------------------------- Right ventricle:  The cavity size was normal. Systolic function was normal.  ------------------------------------------------------------------- Pulmonic valve:    Structurally normal valve.   Cusp separation  was normal.  Doppler:  Transvalvular velocity was within the normal range. There was no regurgitation.  ------------------------------------------------------------------- Tricuspid valve:   Structurally normal valve.   Leaflet separation was normal.  Doppler:  Transvalvular velocity was within the normal range. There was trivial regurgitation.  ------------------------------------------------------------------- Pulmonary artery:   Systolic pressure was within the normal range.   ------------------------------------------------------------------- Right atrium:  The atrium was normal in size.  ------------------------------------------------------------------- Pericardium:  There was no pericardial effusion.  ------------------------------------------------------------------- Measurements   Left ventricle                              Value        Reference  LV ID, ED, PLAX chordal             (L)     36.1  mm     43 - 52  LV ID, ES, PLAX chordal                     24.5  mm     23 - 38  LV fx shortening, PLAX chordal              32    %      >=29  LV PW thickness, ED                         11.4  mm     ---------  IVS/LV PW ratio, ED                         1.13         <=1.3  LV e&', lateral                              6.05  cm/s   ---------  LV E/e&', lateral                            14.36        ---------  LV e&', medial  6.54  cm/s   ---------  LV E/e&', medial                             13.29        ---------  LV e&', average                              6.3   cm/s   ---------  LV E/e&', average                            13.8         ---------    Ventricular septum                          Value        Reference  IVS thickness, ED                           12.9  mm     ---------    Aorta                                       Value        Reference  Aortic root ID, ED                          28    mm     ---------    Left atrium                                  Value        Reference  LA ID, A-P, ES                              33    mm     ---------  LA ID/bsa, A-P                              1.94  cm/m^2 <=2.2  LA volume/bsa, S                            26.4  ml/m^2 ---------    Mitral valve                                Value        Reference  Mitral E-wave peak velocity                 86.9  cm/s   ---------  Mitral deceleration time                    155   ms     150 - 230  Mitral peak gradient, D                     3     mm Hg  ---------  Mitral maximal regurg velocity,             496   cm/s   ---------  PISA  Mitral regurg VTI, PISA                     125   cm     ---------    Pulmonary arteries                          Value        Reference  PA pressure, S, DP                          20    mm Hg  <=30    Tricuspid valve                             Value        Reference  Tricuspid regurg peak velocity              204   cm/s   ---------  Tricuspid peak RV-RA gradient               17    mm Hg  ---------  Tricuspid maximal regurg velocity,          204   cm/s   ---------  PISA    Systemic veins                              Value        Reference  Estimated CVP                               3     mm Hg  ---------    Right ventricle                             Value        Reference  RV pressure, S, DP                          20    mm Hg  <=30  RV s&', lateral, S                           26.4  cm/s   ---------  LEFT TIBIA AND FIBULA - 2 VIEW   COMPARISON:  12/31/2013   FINDINGS: Acute mostly transverse fracture of the proximal/ mid shaft of the left tibia without significant displacement. Marked degenerative changes in the left knee. Left fibula appears intact.   IMPRESSION: Fracture of the proximal/ mid shaft of the left tibia without significant displacement. LEFT FEMUR - 2 VIEW   COMPARISON:  None.   FINDINGS: There is no evidence of fracture or other focal bone lesions. Soft tissues  are unremarkable. Prominent degenerative changes in the left knee. Vascular calcifications.   IMPRESSION: No acute bony abnormalities. PELVIS - 1-2 VIEW   COMPARISON:  Prior radiograph from 02/01/2011   FINDINGS: Right total hip arthroplasty in place. The acetabular and femoral components articulate normally with 1 another. No acute fracture or dislocation. Bony pelvis is intact. SI  joints are approximated. No pubic dye stasis.   Prominent degenerative changes noted within the lower lumbar spine.   No soft tissue abnormality.   Osteopenia noted. Vascular calcifications present within the upper thighs.   IMPRESSION: 1. No acute fracture or dislocation. 2. Right hip arthroplasty in place without evidence of hardware complication.   PORTABLE ABDOMEN - 1 VIEW   COMPARISON:  02/01/2011 and pelvis radiograph dated 07/16/2014.   FINDINGS: Normal bowel gas pattern. Stable 2.2 cm oval calcification in the region of the lower pole of the right kidney. Stable right hip prosthesis. Thoracolumbar spine degenerative changes and scoliosis.   IMPRESSION: No acute abnormality.   CT ANGIOGRAPHY CHEST WITH CONTRAST   TECHNIQUE: Multidetector CT imaging of the chest was performed using the standard protocol during bolus administration of intravenous contrast. Multiplanar CT image reconstructions and MIPs were obtained to evaluate the vascular anatomy.   CONTRAST:  70mL OMNIPAQUE IOHEXOL 350 MG/ML SOLN   COMPARISON:  Chest radiograph 07/18/2014 ; chest CT 07/22/2005   FINDINGS: Motion artifact limits evaluation for the assessment of pulmonary embolism. No evidence for central, main, lobar or segmental pulmonary embolism. Evaluation of the distal segmental and subsegmental pulmonary arteries limited due to motion artifact.   No enlarged axillary, mediastinal or hilar lymphadenopathy. Sub cm right peritracheal lymph node, stable dating back to 2006 heart is enlarged. No  pericardial effusion. Coronary arterial calcifications. Aorta and main pulmonary artery are normal in caliber.   Central airways are patent. Subpleural ground-glass and consolidative opacities within the bilateral lower lobes. Additionally there are scattered patchy ground-glass opacities. There is a 1.5 cm focal area ground-glass within the right upper lobe (image 71; series 47). 5 mm subpleural ground-glass opacity within the left lower lobe (image 41; series 406). Probable trace bilateral pleural effusions.   Limited visualization of the upper abdomen demonstrates free intraperitoneal air. Atrophy of the pancreas. Small hiatal hernia.   Multilevel degenerative change of the thoracic spine.   Review of the MIP images confirms the above findings.   IMPRESSION: 1. Free intraperitoneal air is demonstrated within the upper abdomen. Recommend correlation with abdominal pelvic CT as this is concerning for perforation. 2. Evaluation for pulmonary embolism is limited secondary to motion artifact. No evidence for central, main, lobar or proximal segmental pulmonary emboli. Evaluation of the distal segmental and subsegmental pulmonary arteries are limited due to motion artifact. 3. Bilateral lower lobe ground-glass and subpleural consolidative opacities favored represents atelectasis. Additionally scattered ground-glass pulmonary opacities 4. More focal 1.5 cm area of ground-glass attenuation within the right upper lobe. Follow-up chest CT in 3 months is recommended to assess for persistence. Critical Value/emergent results were called by telephone at the time of interpretation on 07/18/2014 at 11:27 am to Dr. Victorino Dike, who verbally acknowledged these results.   WATER SOLUBLE UPPER GI SERIES   TECHNIQUE: Single-column upper GI series was performed using water soluble contrast.   CONTRAST:  75mL OMNIPAQUE IOHEXOL 300 MG/ML  SOLN   COMPARISON:  07/20/2014   FLUOROSCOPY TIME:  1 min  22 seconds   FINDINGS: The patient ingested water-soluble contrast material. This passed through the distal esophagus and opacifies the stomach, duodenum and proximal small bowel loops. Small collection of contrast material along the D2 segment of the duodenum is favored to represent a large duodenal diverticulum. No extravasation of contrast material identified.   IMPRESSION: 1. No evidence for active extravasation of contrast material. 2. Contrast collection along the descending portion of the duodenum is favored to represent  a duodenal diverticulum. This is similar to 07/20/2014.     CT ABDOMEN WITHOUT CONTRAST   TECHNIQUE: Multidetector CT imaging of the abdomen was performed following the standard protocol without IV contrast.   COMPARISON:  07/20/2014; 07/18/2014.   FINDINGS: Normal heart size.  Coronary arterial calcifications.   Lack of intravenous contrast material limits evaluation of the solid organ parenchyma.   Noncontrast enhanced liver and spleen are unremarkable. Suggestion of gas within the decompressed gallbladder lumen. There is atrophy of the pancreas. Adrenal glands are grossly unremarkable. Cyst within the superior pole of the right kidney. Cystic lesion within the inferior pole of the right kidney with a peripheral thick rim of calcification. Normal caliber abdominal aorta with scattered calcified atherosclerotic plaque. 1.1 cm gastrohepatic lymph node. Additional prominent mesenteric lymph nodes are demonstrated.   A small amount of residual free intraperitoneal air is demonstrated. Oral contrast material is demonstrated throughout the visualized bowel. Adjacent to the second portion of the duodenum is a 2.5 cm collection of oral contrast without surrounding inflammatory change. There is residual inflammatory stranding about the gastric antrum and proximal duodenum.   Extensive degenerative changes of the visualized thoracolumbar spine.    IMPRESSION: Small amount of residual free intraperitoneal air compatible with history of perforated viscus. Oral contrast material is demonstrated throughout the visualized stomach, small and large bowel. Adjacent to the medial aspect of the D2 segment of the duodenum there is a focal outpouching of contrast which is favored to represent contrast within a duodenum diverticulum given the stability in appearance over the prior 2 days and no significant surrounding inflammatory change.   PORTABLE CHEST - 1 VIEW   COMPARISON:  07/18/2014 CT   FINDINGS: Right PICC, with catheter tip projecting over the proximal to mid SVC. Aortic atherosclerosis. Prominent mediastinal fat. Cardiomediastinal contours otherwise within normal range. Minimal lung base atelectasis bilaterally. Otherwise, no confluent airspace opacity, pleural effusion, or pneumothorax. Advanced bilateral shoulder degenerative changes.   IMPRESSION: Right PICC, with catheter tip projecting over the proximal to mid SVC. CT HEAD WITHOUT CONTRAST   TECHNIQUE: Contiguous axial images were obtained from the base of the skull through the vertex without intravenous contrast.   COMPARISON:  None.   FINDINGS: Bony calvarium is intact. No findings to suggest acute hemorrhage, acute infarction or space-occupying mass lesion are noted.   IMPRESSION: No acute intracranial abnormality seen.    ASSESSMENT/PLAN:  Left tibia fracture-continue nonweightbearing status and rehabilitation. Acute blood loss anemia-status post transfusion. Recheck hemoglobin. Hypothyroidism-continue Synthroid Diabetes mellitus-currently diet controlled Atrial fibrillation-rate controlled Hypertension-well-controlled Perforated gastric or duodenal ulcer -conservative management chosen given age and core morbidities. Check CBC with differential and BMP  I have reviewed patient's medical records received at admission/from hospitalization.  CPT  CODE: 16109  Angela Cox, MD [facility] [phone]

## 2014-08-08 ENCOUNTER — Non-Acute Institutional Stay (SKILLED_NURSING_FACILITY): Payer: Medicare Other | Admitting: Adult Health

## 2014-08-08 ENCOUNTER — Encounter: Payer: Self-pay | Admitting: Adult Health

## 2014-08-08 DIAGNOSIS — N390 Urinary tract infection, site not specified: Secondary | ICD-10-CM

## 2014-08-08 NOTE — Progress Notes (Signed)
Patient ID: Kelli Pena, female   DOB: January 09, 1920, 80 y.o.   MRN: [id number]         PROGRESS NOTE  DATE: 08/08/2014  FACILITY:  [facility]  LEVEL OF CARE: SNF (31)  Acute Visit  CHIEF COMPLAINT:  Manage UTI  HISTORY OF PRESENT ILLNESS:  This is is a 80 year old female who complained of dysuria. Urine culture shows > 100,00 CFU/ml E. Coli. No fever nor hematuria reported.  PAST MEDICAL HISTORY : Reviewed.  No changes/see problem list  CURRENT MEDICATIONS: Reviewed per MAR/see medication list  REVIEW OF SYSTEMS:  GENERAL: no change in appetite, no fatigue, no weight changes, no fever, chills or weakness RESPIRATORY: no cough, SOB, DOE,, wheezing, hemoptysis CARDIAC: no chest pain, edema or palpitations GI: no abdominal pain, diarrhea, constipation, heart burn, nausea or vomiting  PHYSICAL EXAMINATION  GENERAL: no acute distress, normal body habitus EYES: conjunctivae normal, sclerae normal, normal eye lids NECK: supple, trachea midline, no neck masses, no thyroid tenderness, no thyromegaly LYMPHATICS: no LAN in the neck, no supraclavicular LAN RESPIRATORY: breathing is even & unlabored, BS CTAB CARDIAC: RRR, no murmur,no extra heart sounds, no edema GI: abdomen soft, normal BS, no masses, no tenderness, no hepatomegaly, no splenomegaly EXTREMITIES:  LLE long cast PSYCHIATRIC: the patient is alert & oriented to person, affect & behavior appropriate  LABS/RADIOLOGY: 07/29/14  WBC 10.6 hemoglobin 9.1 hematocrit 30.0 MCV 96.2 social 136 potassium 4.3 glucose 117 BUN 9 creatinine 0.8 calcium 8.8   ASSESSMENT/PLAN:  UTI - start Bactrim DS 1 tab by mouth twice a day x7 days   CPT CODE: 78469  Ella Bodo - NP [facility] [phone]

## 2014-08-13 ENCOUNTER — Other Ambulatory Visit: Payer: Self-pay | Admitting: *Deleted

## 2014-08-13 MED ORDER — HYDROCODONE-ACETAMINOPHEN 5-325 MG PO TABS: ORAL_TABLET | ORAL | Status: DC

## 2014-08-13 NOTE — Telephone Encounter (Signed)
[facility] 

## 2014-09-22 ENCOUNTER — Non-Acute Institutional Stay (SKILLED_NURSING_FACILITY): Payer: Medicare Other | Admitting: Internal Medicine

## 2014-09-22 ENCOUNTER — Encounter: Payer: Self-pay | Admitting: Internal Medicine

## 2014-09-22 DIAGNOSIS — I482 Chronic atrial fibrillation, unspecified: Secondary | ICD-10-CM

## 2014-09-22 DIAGNOSIS — I10 Essential (primary) hypertension: Secondary | ICD-10-CM

## 2014-09-22 DIAGNOSIS — E1140 Type 2 diabetes mellitus with diabetic neuropathy, unspecified: Secondary | ICD-10-CM

## 2014-09-22 DIAGNOSIS — E039 Hypothyroidism, unspecified: Secondary | ICD-10-CM

## 2014-09-22 NOTE — Progress Notes (Signed)
Patient ID: Kelli Pena, female   DOB: February 06, 1920, [age] y.o.   MRN: [id number]   Place of Service: [facility]  Allergies  Allergen Reactions  . Tetanus Toxoids Swelling    Code Status: Full Code  Goals of Care: short term rehabilitation   Chief Complaint  Patient presents with  . Medical Management of Chronic Issues    HTN, DM, hypothyroidism, afib    HPI 80 y.o. female with PMH of DM2 with neuropathy, HTN, chronic afib, hypothyroidism among others is being seen for a routine visit. No falls or skin issues reported. Weight stable. No change in functional status or behaviors reported. No additional concerns from staff. No complaint verbalized from patient.   Review of Systems Constitutional: Negative for fever, chills, and fatigue. HENT: Negative for facial swelling, ear pain, congestion, and sore throat Eyes: Negative for eye pain, eye discharge, and visual disturbance  Cardiovascular: Negative for chest pain, palpitations, and leg swelling Respiratory: Negative cough, shortness of breath, and wheezing.  Gastrointestinal: Negative for nausea and vomiting. Negative for abdominal pain, diarrhea and constipation.  Musculoskeletal: Negative for back pain, joint pain, and joint swelling  Neurological: Negative for dizziness, headache, weakness, and tremors.  Skin: Negative for rash and wound.   Psychiatric: Negative for nervous/anxious, agitation, depression, and suicidal ideas.   Past Medical History  Diagnosis Date  . Hypertension   . Diabetes mellitus without complication   . Arthritis   . Hypothyroid   . Fibromyalgia   . Complication of anesthesia     trouble waking up, very cold, hypotension  . Atrial fibrillation     Past Surgical History  Procedure Laterality Date  . Hip surgery    . Cholecystectomy  1980s?    open chole  . Eye surgery      History   Social History  . Marital Status: Widowed    Spouse Name: N/A    Number of Children: N/A  .  Years of Education: 12   Occupational History  . RETIRED     Social History Main Topics  . Smoking status: Former Smoker -- 1.50 packs/day for 20 years    Types: Cigarettes    Quit date: 11/21/1965  . Smokeless tobacco: Never Used  . Alcohol Use: Yes     Comment: Rarely   . Drug Use: No  . Sexual Activity: No   Other Topics Concern  . Not on file   Social History Narrative   Marital Status: Widowed    Children: Daughter Oceanographer([name])   Pets:  Dog (1)    Living Situation: Lives alone.   Occupation: Retired Training and development officer(Secretary)    Education: Engineer, agriculturalHigh School Graduate    Tobacco Use/Exposure:  She smoked 1 - 1 1/2 ppd for 20 years. She quit in 1967.    Alcohol Use:  Rarely   Drug Use:  None   Diet:  Regular   Exercise:  None   Hobbies: Playing with Dog/ Taking Picture/ Reading               Medication List       This list is accurate as of: 09/22/14  1:37 PM.  Always use your most recent med list.               diclofenac sodium 1 % Gel  Commonly known as:  VOLTAREN  Apply 4 g topically 4 (four) times daily as needed (for pain).     diltiazem 120 MG 24 hr capsule  Commonly known as:  CARDIZEM CD  Take 1 capsule (120 mg total) by mouth daily.     DULoxetine 60 MG capsule  Commonly known as:  CYMBALTA  Take 60 mg by mouth every morning.     furosemide 40 MG tablet  Commonly known as:  LASIX  Take 40 mg by mouth daily.     gabapentin 600 MG tablet  Commonly known as:  NEURONTIN  Take 600 mg by mouth at bedtime as needed (for pain).     HYDROcodone-acetaminophen 5-325 MG per tablet  Commonly known as:  NORCO/VICODIN  Take one tablet by mouth every 4 hours as needed for pain     levothyroxine 75 MCG tablet  Commonly known as:  SYNTHROID, LEVOTHROID  Take 75 mcg by mouth daily before breakfast.     lidocaine 5 %  Commonly known as:  LIDODERM  Place 3 patches onto the skin daily as needed (for pain). Apply to both knees and right shoulder. Remove & Discard patch within 12  hours or as directed by MD     multivitamin tablet  Take 1 tablet by mouth daily.     pantoprazole 40 MG tablet  Commonly known as:  PROTONIX  Take 1 tablet (40 mg total) by mouth 2 (two) times daily.     polyethylene glycol packet  Commonly known as:  MIRALAX / GLYCOLAX  Take 4-17 g by mouth daily as needed (constipation).     POTASSIUM PO  Take 595 mg by mouth daily.     Vitamin D (Ergocalciferol) 50000 UNITS Caps capsule  Commonly known as:  DRISDOL  Take 50,000 Units by mouth every 7 (seven) days. Saturday.        Physical Exam Filed Vitals:   09/22/14 1326  BP: 121/65  Pulse: 97  Temp: 98.6 F (37 C)  Resp: 16  Body mass index is 30.32 kg/(m^2).  Constitutional: WDWN elderly female in no acute distress. Conversant and pleasant.  HEENT: Normocephalic and atraumatic. PERRL. EOM intact. No icterus. No nasal discharge or sinus tenderness. Oral mucosa moist. Posterior pharynx clear of any exudate or lesions.  Neck: Supple and nontender. No lymphadenopathy, masses, or thyromegaly. No JVD or carotid bruits. Cardiac: Normal S1, S2. Regular rate and irregular rhythm without appreciable murmurs, rubs, or gallops. Right pedal pulse intact. No edema noted.  Lungs: No respiratory distress. Breath sounds clear bilaterally without rales, rhonchi, or wheezes. Abdomen: Audible bowel sounds in all quadrants. Soft, nontender, nondistended. No palpable mass.  Musculoskeletal: Able to move all extremites . No joint erythema or tenderness. Left leg brace/immobilizer in place Skin: Warm and dry. No rash noted. No erythema.  Neurological: Alert and oriented to person, place, and time.  Psychiatric: Judgment and insight adequate. Appropriate mood and affect.   Labs Reviewed CBC Latest Ref Rng 07/27/2014 07/26/2014 07/25/2014  WBC 4.0 - 10.5 K/uL 12.2(H) 10.8(H) -  Hemoglobin 12.0 - 15.0 g/dL 10.6(L) 9.5(L) 9.6(L)  Hematocrit 36.0 - 46.0 % 31.4(L) 28.2(L) 28.4(L)  Platelets 150 - 400 K/uL  457(H) 371 -    CMP     Component Value Date/Time   NA 136* 07/27/2014 0551   K 3.8 07/27/2014 0551   CL 102 07/27/2014 0551   CO2 24 07/27/2014 0551   GLUCOSE 130* 07/27/2014 0551   BUN 10 07/27/2014 0551   CREATININE 0.75 07/27/2014 0551   CREATININE 1.40* 04/07/2014 1601   CALCIUM 8.5 07/27/2014 0551   PROT 6.3 07/19/2014 0850   ALBUMIN 2.7* 07/19/2014 16100850  AST 17 07/19/2014 0850   ALT 13 07/19/2014 0850   ALKPHOS 84 07/19/2014 0850   BILITOT 0.6 07/19/2014 0850   GFRNONAA 71* 07/27/2014 0551   GFRNONAA 33* 04/07/2014 1601   GFRAA 83* 07/27/2014 0551   GFRAA 38* 04/07/2014 1601    Lab Results  Component Value Date   TSH 1.210 07/18/2014    Assessment & Plan 1. Type 2 diabetes mellitus with diabetic neuropathy Diet controlled. Continue gabapentin 600mg  daily at bedtime as needed.   2. Essential hypertension, benign Stable, continue diltiazem 120mg  daily and lasix 40mg  daily. Continue fall risk precautions. Continue to monitor. Check renal function as pt on lasix. Continue kcl supplement  3. Hypothyroidism, unspecified hypothyroidism type Stable. Continue levothyroxine daily and monitor.   4. Chronic atrial fibrillation Stable. Rate controlled. Continue diltiazem 120mg  daily and monitor. CHADS-2 is 3 but not on any anticoagulation due to gi bleed in the hospital. Recheck cbc and if hb > 11, can consider EC aspirin  Labs ordered- cbc, cmp  Family/Staff Communication Plan of care discuss with resident and professional staff members. Resident and professional staff members verbalize understanding and agree with plan of care. No additional questions or concerns reported.    Loura Back, MSN, AGNP-C [facility] [address] [place], [place] [address] [phone] [8am-5pm] After hours: [phone]    I have personally reviewed this note and agree with the care plan  [facility], MD  [facility] [phone] (Monday-Friday 8 am  - 5 pm) [phone] (afterhours)

## 2014-10-01 ENCOUNTER — Non-Acute Institutional Stay (SKILLED_NURSING_FACILITY): Payer: Medicare Other | Admitting: Adult Health

## 2014-10-01 ENCOUNTER — Encounter: Payer: Self-pay | Admitting: Adult Health

## 2014-10-01 DIAGNOSIS — G629 Polyneuropathy, unspecified: Secondary | ICD-10-CM

## 2014-10-01 DIAGNOSIS — K922 Gastrointestinal hemorrhage, unspecified: Secondary | ICD-10-CM

## 2014-10-01 DIAGNOSIS — E1140 Type 2 diabetes mellitus with diabetic neuropathy, unspecified: Secondary | ICD-10-CM

## 2014-10-01 DIAGNOSIS — E039 Hypothyroidism, unspecified: Secondary | ICD-10-CM

## 2014-10-01 DIAGNOSIS — S82202D Unspecified fracture of shaft of left tibia, subsequent encounter for closed fracture with routine healing: Secondary | ICD-10-CM

## 2014-10-01 DIAGNOSIS — I10 Essential (primary) hypertension: Secondary | ICD-10-CM

## 2014-10-01 NOTE — Progress Notes (Signed)
Patient ID: Kelli CruelJean F Fariss, female   DOB: 05-Jul-1920, 80 y.o.   MRN: [id number]   10/01/2014  Facility:  Nursing Home Location:  [facility] Room Number: 804-1 LEVEL OF CARE:  SNF (31)   Chief Complaint  Patient presents with  . Discharge Note    Left tibia fracture, Diabetes Mellitus, Hypertension, Neuropathy, GI BLeed and Fibromyalgia    HISTORY OF PRESENT ILLNESS:  This is a 80 year old female who is for discharge home with home health PT, OT, CNA, social worker and nursing. DME: wheelchair with cushion and anti-tippers, leg rests and bedside commode.[facility] . She fell and sustained a nondisplaced left tibia. A long cast was placed and now has a LLE brace for weight bearing. Patient was admitted to this facility for short-term rehabilitation after the patient's recent hospitalization.  Patient has completed SNF rehabilitation and therapy has cleared the patient for discharge.  REASSESSMENT OF ONGOING PROBLEMS:  HTN: Pt 's HTN remains stable.  Denies CP, sob, DOE, pedal edema, headaches, dizziness or visual disturbances.  No complications from the medications currently being used.  Last BP : 130/71  ATRIAL FIBRILLATION: the patients atrial fibrillation remains stable.  The patient denies DOE, tachycardia, orthopnea, transient neurological sx, pedal edema, palpitations, & PNDs.  No anticoagulant @ this time due to a recent GI Bleed.Marland Kitchen.  PERIPHERAL NEUROPATHY: The peripheral neuropathy is stable. The patient denies pain in the feet, tingling, and numbness. No complications noted from the medication presently being used.  PAST MEDICAL HISTORY:  Past Medical History  Diagnosis Date  . Hypertension   . Diabetes mellitus without complication   . Arthritis   . Hypothyroid   . Fibromyalgia   . Complication of anesthesia     trouble waking up, very cold, hypotension  . Atrial fibrillation      CURRENT MEDICATIONS: Reviewed per MAR/see medication list  Allergies  Allergen Reactions  . Tetanus Toxoids Swelling     REVIEW OF SYSTEMS:  GENERAL: no change in appetite, no fatigue, no weight changes, no fever, chills or weakness RESPIRATORY: no cough, SOB, DOE, wheezing, hemoptysis CARDIAC: no chest pain, edema or palpitations GI: no abdominal pain, diarrhea, constipation, heart burn, nausea or vomiting  PHYSICAL EXAMINATION  GENERAL: no acute distress, normal body habitus EYES: conjunctivae normal, sclerae normal, normal eye lids NECK: supple, trachea midline, no neck masses, no thyroid tenderness, no thyromegaly LYMPHATICS: no LAN in the neck, no supraclavicular LAN RESPIRATORY: breathing is even & unlabored, BS CTAB CARDIAC: RRR, no murmur,no extra heart sounds, no edema GI: abdomen soft, normal BS, no masses, no tenderness, no hepatomegaly, no splenomegaly EXTREMITIES:  Able to move all 4 extremities; LLE brace PSYCHIATRIC: the patient is alert & oriented to person, affect & behavior appropriate  LABS/RADIOLOGY: Labs reviewed: Basic Metabolic Panel:  Recent Labs  91/47/8208/31/15 0620  07/23/14 1045 07/24/14 0302 07/25/14 0400 07/26/14 0451 07/27/14 0551  NA 148*  < >  --  144 141 139 136*  K 3.9  < >  --  3.6* 3.0* 3.6* 3.8  CL 109  < >  --  107 105 106 102  CO2 22  < >  --  24 26 25 24   GLUCOSE 155*  < >  --  193* 153* 97 130*  BUN 35*  < >  --  37* 21 12 10   CREATININE 0.98  < >  --  1.43* 0.99 0.82  0.75  CALCIUM 9.3  < >  --  8.2* 8.2* 8.2* 8.5  MG 2.4  --  2.0 1.8  --   --   --   PHOS 3.2  --   --   --   --   --   --   < > = values in this interval not displayed. Liver Function Tests:  Recent Labs  04/07/14 1601 07/17/14 0012 07/19/14 0850  AST 13 20 17   ALT 12 14 13   ALKPHOS 94 92 84  BILITOT 0.3 0.4 0.6  PROT 6.4 6.2 6.3  ALBUMIN 4.0 3.2* 2.7*    Recent Labs  07/18/14 1620  AMMONIA 19   CBC:  Recent Labs  04/07/14 1601  07/17/14 0012  07/24/14 0302  07/25/14 0400 07/26/14 0451 07/27/14 0551  WBC 9.3 9.4  < > 11.6*  --   --  10.8* 12.2*  NEUTROABS 6.2 7.6  --   --   --   --   --   --   HGB 12.2 11.5*  < > 7.1*  < > 9.6* 9.5* 10.6*  HCT 36.9 34.5*  < > 22.0*  < > 28.4* 28.2* 31.4*  MCV 88.5 90.1  < > 90.2  --   --  89.0 87.2  PLT 433* 387  < > 480*  --   --  371 457*  < > = values in this interval not displayed.  CBG:  Recent Labs  07/20/14 2149 07/21/14 0620 07/23/14 1249  GLUCAP 174* 141* 174*   Dg Chest 2 View  07/16/2014   CLINICAL DATA:  Larey Seat this morning.  EXAM: CHEST  2 VIEW  COMPARISON:  07/22/2005.  FINDINGS: Normal sized heart. Clear lungs. Thoracic spine degenerative changes. 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 No fracture or pneumothorax.  IMPRESSION: No acute abnormality.   Electronically Signed   By: Gordan Payment M.D.   On: 07/16/2014 23:34   Dg Pelvis 1-2 Views  07/16/2014   CLINICAL DATA:  Fall  EXAM: PELVIS - 1-2 VIEW  COMPARISON:  Prior radiograph from 02/01/2011  FINDINGS: Right total hip arthroplasty in place. The acetabular and femoral components articulate normally with 1 another. No acute fracture or dislocation. Bony pelvis is intact. SI joints are approximated. No pubic dye stasis.  Prominent degenerative changes noted within the lower lumbar spine.  No soft tissue abnormality.  Osteopenia noted. Vascular calcifications present within the upper thighs.  IMPRESSION: 1. No acute fracture or dislocation. 2. Right hip arthroplasty in place without evidence of hardware complication.   Electronically Signed   By: Rise Mu M.D.   On: 07/16/2014 23:34   Dg Femur Left  07/16/2014   CLINICAL DATA:  Larey Seat at 6 a.m. this morning.  Left lower leg pain.  EXAM: LEFT FEMUR - 2 VIEW  COMPARISON:  None.  FINDINGS: There is no evidence of fracture or other focal bone lesions. Soft tissues are unremarkable. Prominent degenerative changes in  the left knee. Vascular calcifications.  IMPRESSION: No acute bony abnormalities.   Electronically Signed   By: Burman Nieves M.D.   On: 07/16/2014 23:31   Dg Tibia/fibula Left  07/16/2014   CLINICAL DATA:  Left lower leg pain after a fall at 6 a.m. this morning.  EXAM: LEFT TIBIA AND FIBULA - 2 VIEW  COMPARISON:  12/31/2013  FINDINGS: Acute mostly transverse fracture of the proximal/ mid shaft of the left tibia without significant displacement. Marked degenerative changes in the  left knee. Left fibula appears intact.  IMPRESSION: Fracture of the proximal/ mid shaft of the left tibia without significant displacement.   Electronically Signed   By: Burman NievesWilliam  Stevens M.D.   On: 07/16/2014 23:32   Dg Abd Portable 1v  07/17/2014   CLINICAL DATA:  Abdominal pain and vomiting.  EXAM: PORTABLE ABDOMEN - 1 VIEW  COMPARISON:  02/01/2011 and pelvis radiograph dated 07/16/2014.  FINDINGS: Normal bowel gas pattern. Stable 2.2 cm oval calcification in the region of the lower pole of the right kidney. Stable right hip prosthesis. Thoracolumbar spine degenerative changes and scoliosis.  IMPRESSION: No acute abnormality.   Electronically Signed   By: Gordan PaymentSteve  Reid M.D.   On: 07/17/2014 23:04    ASSESSMENT/PLAN:  Left tibia fracture - has a LLE brace for weight bearing; for home health PT, OT, CNA, social worker and nursing Type 2 diabetes mellitus with diabetic neuropathy - diet controlled Essential hypertension, benign - well controlled; continue diltiazem CD 120 mg 1 capsule by mouth daily, Lasix 40 mg by mouth daily and KCl supplement Hypothyroidism - continue Synthroid 75 g 1 tab by mouth daily Chronic atrial fibrillation - rate controlled; no episode of GI bleed while in SNF; latest hemoglobin is 12.3.; start enteric-coated aspirin 81 mg by mouth daily Lower GI bleed - continue Protonix 40 mg by mouth twice a day   I have filled out patient's discharge paperwork and written prescriptions.  Patient will  receive home health PT, OT, SW, Nursing and CNA.  DME provided: wheelchair with cushion and anti-tippers, leg rests and bedside commode  Total discharge time: Greater than 30 minutes  Discharge time involved coordination of the discharge process with social worker, nursing staff and therapy department. Medical justification for home health services/DME verified.    CPT CODE: [id number]   [facility] ClinicMEDINA-VARGAS,MONINA, NP [facility] [phone]

## 2014-10-15 ENCOUNTER — Inpatient Hospital Stay (HOSPITAL_COMMUNITY)
Admission: EM | Admit: 2014-10-15 | Discharge: 2014-10-20 | DRG: 871 | Disposition: A | Discharge status: Home Health | Payer: Medicare Other | Attending: Internal Medicine | Admitting: Internal Medicine

## 2014-10-15 ENCOUNTER — Encounter (HOSPITAL_COMMUNITY): Payer: Self-pay | Admitting: Emergency Medicine

## 2014-10-15 DIAGNOSIS — I4891 Unspecified atrial fibrillation: Secondary | ICD-10-CM | POA: Diagnosis present

## 2014-10-15 DIAGNOSIS — E876 Hypokalemia: Secondary | ICD-10-CM

## 2014-10-15 DIAGNOSIS — A419 Sepsis, unspecified organism: Secondary | ICD-10-CM | POA: Diagnosis present

## 2014-10-15 DIAGNOSIS — I10 Essential (primary) hypertension: Secondary | ICD-10-CM | POA: Diagnosis present

## 2014-10-15 DIAGNOSIS — A0472 Enterocolitis due to Clostridium difficile, not specified as recurrent: Secondary | ICD-10-CM

## 2014-10-15 DIAGNOSIS — R109 Unspecified abdominal pain: Secondary | ICD-10-CM

## 2014-10-15 DIAGNOSIS — Z79899 Other long term (current) drug therapy: Secondary | ICD-10-CM | POA: Diagnosis not present

## 2014-10-15 DIAGNOSIS — A047 Enterocolitis due to Clostridium difficile: Secondary | ICD-10-CM | POA: Diagnosis present

## 2014-10-15 DIAGNOSIS — E119 Type 2 diabetes mellitus without complications: Secondary | ICD-10-CM | POA: Diagnosis present

## 2014-10-15 DIAGNOSIS — G629 Polyneuropathy, unspecified: Secondary | ICD-10-CM | POA: Diagnosis present

## 2014-10-15 DIAGNOSIS — E871 Hypo-osmolality and hyponatremia: Secondary | ICD-10-CM

## 2014-10-15 DIAGNOSIS — D72829 Elevated white blood cell count, unspecified: Secondary | ICD-10-CM | POA: Diagnosis present

## 2014-10-15 DIAGNOSIS — E039 Hypothyroidism, unspecified: Secondary | ICD-10-CM | POA: Diagnosis present

## 2014-10-15 DIAGNOSIS — G2581 Restless legs syndrome: Secondary | ICD-10-CM | POA: Diagnosis present

## 2014-10-15 DIAGNOSIS — E43 Unspecified severe protein-calorie malnutrition: Secondary | ICD-10-CM | POA: Diagnosis present

## 2014-10-15 DIAGNOSIS — Z823 Family history of stroke: Secondary | ICD-10-CM

## 2014-10-15 DIAGNOSIS — F329 Major depressive disorder, single episode, unspecified: Secondary | ICD-10-CM | POA: Diagnosis present

## 2014-10-15 DIAGNOSIS — M797 Fibromyalgia: Secondary | ICD-10-CM | POA: Diagnosis present

## 2014-10-15 DIAGNOSIS — S82209A Unspecified fracture of shaft of unspecified tibia, initial encounter for closed fracture: Secondary | ICD-10-CM | POA: Diagnosis present

## 2014-10-15 DIAGNOSIS — S82202D Unspecified fracture of shaft of left tibia, subsequent encounter for closed fracture with routine healing: Secondary | ICD-10-CM | POA: Diagnosis not present

## 2014-10-15 DIAGNOSIS — Z87891 Personal history of nicotine dependence: Secondary | ICD-10-CM

## 2014-10-15 DIAGNOSIS — E860 Dehydration: Secondary | ICD-10-CM | POA: Diagnosis present

## 2014-10-15 DIAGNOSIS — Z7982 Long term (current) use of aspirin: Secondary | ICD-10-CM

## 2014-10-15 LAB — CBC WITH DIFFERENTIAL/PLATELET
BASOS PCT: 0 % (ref 0–1)
Basophils Absolute: 0 10*3/uL (ref 0.0–0.1)
EOS ABS: 0.1 10*3/uL (ref 0.0–0.7)
Eosinophils Relative: 1 % (ref 0–5)
HCT: 37.9 % (ref 36.0–46.0)
HEMOGLOBIN: 13 g/dL (ref 12.0–15.0)
Lymphocytes Relative: 7 % — ABNORMAL LOW (ref 12–46)
Lymphs Abs: 0.8 10*3/uL (ref 0.7–4.0)
MCH: 27.8 pg (ref 26.0–34.0)
MCHC: 34.3 g/dL (ref 30.0–36.0)
MCV: 81 fL (ref 78.0–100.0)
Monocytes Absolute: 0.8 10*3/uL (ref 0.1–1.0)
Monocytes Relative: 7 % (ref 3–12)
Neutro Abs: 10.5 10*3/uL — ABNORMAL HIGH (ref 1.7–7.7)
Neutrophils Relative %: 85 % — ABNORMAL HIGH (ref 43–77)
Platelets: 407 10*3/uL — ABNORMAL HIGH (ref 150–400)
RBC: 4.68 MIL/uL (ref 3.87–5.11)
RDW: 14.9 % (ref 11.5–15.5)
WBC: 12.3 10*3/uL — ABNORMAL HIGH (ref 4.0–10.5)

## 2014-10-15 LAB — BASIC METABOLIC PANEL
Anion gap: 15 (ref 5–15)
BUN: 18 mg/dL (ref 6–23)
CALCIUM: 8.4 mg/dL (ref 8.4–10.5)
CO2: 21 mEq/L (ref 19–32)
CREATININE: 0.74 mg/dL (ref 0.50–1.10)
Chloride: 89 mEq/L — ABNORMAL LOW (ref 96–112)
GFR, EST AFRICAN AMERICAN: 83 mL/min — AB (ref >90)
GFR, EST NON AFRICAN AMERICAN: 72 mL/min — AB (ref >90)
Glucose, Bld: 129 mg/dL — ABNORMAL HIGH (ref 70–99)
Potassium: 3.3 mEq/L — ABNORMAL LOW (ref 3.7–5.3)
Sodium: 125 mEq/L — ABNORMAL LOW (ref 137–147)

## 2014-10-15 LAB — URINE MICROSCOPIC-ADD ON

## 2014-10-15 LAB — URINALYSIS, ROUTINE W REFLEX MICROSCOPIC
Bilirubin Urine: NEGATIVE
GLUCOSE, UA: NEGATIVE mg/dL
Ketones, ur: NEGATIVE mg/dL
LEUKOCYTES UA: NEGATIVE
Nitrite: NEGATIVE
PH: 6 (ref 5.0–8.0)
PROTEIN: 30 mg/dL — AB
Specific Gravity, Urine: 1.022 (ref 1.005–1.030)
Urobilinogen, UA: 0.2 mg/dL (ref 0.0–1.0)

## 2014-10-15 MED ORDER — POTASSIUM CHLORIDE CRYS ER 20 MEQ PO TBCR
40.0000 meq | EXTENDED_RELEASE_TABLET | Freq: Two times a day (BID) | ORAL | Status: DC
  Administered 2014-10-15 – 2014-10-16 (×4): 40 meq via ORAL
  Filled 2014-10-15 (×6): qty 2

## 2014-10-15 MED ORDER — METRONIDAZOLE IN NACL 5-0.79 MG/ML-% IV SOLN
500.0000 mg | Freq: Three times a day (TID) | INTRAVENOUS | Status: DC
  Administered 2014-10-15 – 2014-10-18 (×8): 500 mg via INTRAVENOUS
  Filled 2014-10-15 (×9): qty 100

## 2014-10-15 MED ORDER — SODIUM CHLORIDE 0.9 % IV BOLUS (SEPSIS)
1000.0000 mL | Freq: Once | INTRAVENOUS | Status: AC
  Administered 2014-10-15: 1000 mL via INTRAVENOUS

## 2014-10-15 MED ORDER — CIPROFLOXACIN IN D5W 400 MG/200ML IV SOLN
400.0000 mg | Freq: Two times a day (BID) | INTRAVENOUS | Status: DC
  Administered 2014-10-16: 400 mg via INTRAVENOUS
  Filled 2014-10-15 (×2): qty 200

## 2014-10-15 MED ORDER — SODIUM CHLORIDE 0.9 % IV BOLUS (SEPSIS)
500.0000 mL | Freq: Once | INTRAVENOUS | Status: AC
  Administered 2014-10-15: 500 mL via INTRAVENOUS

## 2014-10-15 NOTE — ED Provider Notes (Signed)
CSN: [id number]     Arrival date & time 10/15/14  1359 History   First MD Initiated Contact with Patient 10/15/14 1410     Chief Complaint  Patient presents with  . Diarrhea  . Hypotension     (Consider location/radiation/quality/duration/timing/severity/associated sxs/prior Treatment) Patient is a 80 y.o. female presenting with diarrhea. The history is provided by the patient and a relative. No language interpreter was used.  Diarrhea Quality:  Watery and malodorous Severity:  Moderate Onset quality:  Gradual Number of episodes:  Unknown Duration:  1 week Timing:  Constant Progression:  Unchanged Relieved by:  Nothing Worsened by:  Nothing tried Ineffective treatments:  Anti-motility medications (immodium and pepto bismol) Associated symptoms: no abdominal pain, no arthralgias, no chills, no fever, no myalgias, no URI and no vomiting   Risk factors: no recent antibiotic use, no sick contacts, no suspicious food intake and no travel to endemic areas     Past Medical History  Diagnosis Date  . Hypertension   . Diabetes mellitus without complication   . Arthritis   . Hypothyroid   . Fibromyalgia   . Complication of anesthesia     trouble waking up, very cold, hypotension  . Atrial fibrillation    Past Surgical History  Procedure Laterality Date  . Hip surgery    . Cholecystectomy  1980s?    open chole  . Eye surgery     Family History  Problem Relation Age of Onset  . Stroke Father   . Arthritis Sister   . Arthritis Daughter    History  Substance Use Topics  . Smoking status: Former Smoker -- 1.50 packs/day for 20 years    Types: Cigarettes    Quit date: 11/21/1965  . Smokeless tobacco: Never Used  . Alcohol Use: Yes     Comment: Rarely    OB History    No data available     Review of Systems  Constitutional: Positive for appetite change. Negative for fever, chills and fatigue.  HENT: Negative for trouble swallowing.   Eyes: Negative for visual  disturbance.  Respiratory: Negative for shortness of breath.   Cardiovascular: Negative for chest pain and palpitations.  Gastrointestinal: Positive for diarrhea. Negative for nausea, vomiting and abdominal pain.  Genitourinary: Negative for dysuria and difficulty urinating.  Musculoskeletal: Negative for myalgias, arthralgias and neck pain.  Skin: Negative for color change.  Neurological: Negative for dizziness and weakness.  Psychiatric/Behavioral: Negative for dysphoric mood.      Allergies  Tetanus toxoids  Home Medications   Prior to Admission medications   Medication Sig Start Date End Date Taking? Authorizing Provider  diclofenac sodium (VOLTAREN) 1 % GEL Apply 4 g topically 4 (four) times daily as needed (for pain).     Historical Provider, MD  diltiazem (CARDIZEM CD) 120 MG 24 hr capsule Take 1 capsule (120 mg total) by mouth daily. 07/28/14   Kathlen ModyVijaya Akula, MD  DULoxetine (CYMBALTA) 60 MG capsule Take 60 mg by mouth every morning.  12/12/13 12/12/14  Gillian Scarceobyn K Zanard, MD  furosemide (LASIX) 40 MG tablet Take 40 mg by mouth daily.    Historical Provider, MD  gabapentin (NEURONTIN) 600 MG tablet Take 600 mg by mouth at bedtime as needed (for pain).     Historical Provider, MD  HYDROcodone-acetaminophen (NORCO/VICODIN) 5-325 MG per tablet Take one tablet by mouth every 4 hours as needed for pain 08/13/14   Oneal GroutMahima Pandey, MD  levothyroxine (SYNTHROID, LEVOTHROID) 75 MCG tablet Take 75 mcg by  mouth daily before breakfast.    Historical Provider, MD  lidocaine (LIDODERM) 5 % Place 3 patches onto the skin daily as needed (for pain). Apply to both knees and right shoulder. Remove & Discard patch within 12 hours or as directed by MD    Historical Provider, MD  Multiple Vitamin (MULTIVITAMIN) tablet Take 1 tablet by mouth daily.    Historical Provider, MD  pantoprazole (PROTONIX) 40 MG tablet Take 1 tablet (40 mg total) by mouth 2 (two) times daily. 07/28/14   Kathlen Mody, MD  polyethylene  glycol (MIRALAX / GLYCOLAX) packet Take 4-17 g by mouth daily as needed (constipation). 12/28/12   Joseph Art, DO  POTASSIUM PO Take 595 mg by mouth daily.     Historical Provider, MD  Vitamin D, Ergocalciferol, (DRISDOL) 50000 UNITS CAPS capsule Take 50,000 Units by mouth every 7 (seven) days. Saturday. 12/12/13 12/12/14  Gillian Scarce, MD   BP 116/63 mmHg  Pulse 95  Temp(Src) 98.5 F (36.9 C) (Oral)  Resp 19  SpO2 96% Physical Exam  Constitutional: She is oriented to person, place, and time. She appears well-developed and well-nourished. No distress.  HENT:  Head: Normocephalic and atraumatic.  Mouth/Throat: No oropharyngeal exudate.  Eyes: Conjunctivae and EOM are normal.  Neck: Normal range of motion.  Cardiovascular: Normal rate and regular rhythm.  Exam reveals no gallop and no friction rub.   No murmur heard. Pulmonary/Chest: Effort normal and breath sounds normal. She has no wheezes. She has no rales. She exhibits no tenderness.  Abdominal: Soft. She exhibits no distension. There is no tenderness. There is no rebound.  Musculoskeletal: Normal range of motion.  Neurological: She is alert and oriented to person, place, and time. Coordination normal.  Speech is goal-oriented. Moves limbs without ataxia.   Skin: Skin is warm and dry.  Psychiatric: She has a normal mood and affect. Her behavior is normal.  Nursing note and vitals reviewed.   ED Course  Procedures (including critical care time) Labs Review Labs Reviewed  CBC WITH DIFFERENTIAL - Abnormal; Notable for the following:    WBC 12.3 (*)    Platelets 407 (*)    Neutrophils Relative % 85 (*)    Neutro Abs 10.5 (*)    Lymphocytes Relative 7 (*)    All other components within normal limits  BASIC METABOLIC PANEL - Abnormal; Notable for the following:    Sodium 125 (*)    Potassium 3.3 (*)    Chloride 89 (*)    Glucose, Bld 129 (*)    GFR calc non Af Amer 72 (*)    GFR calc Af Amer 83 (*)    All other components  within normal limits  URINALYSIS, ROUTINE W REFLEX MICROSCOPIC - Abnormal; Notable for the following:    Hgb urine dipstick SMALL (*)    Protein, ur 30 (*)    All other components within normal limits  URINE MICROSCOPIC-ADD ON - Abnormal; Notable for the following:    Squamous Epithelial / LPF FEW (*)    Bacteria, UA FEW (*)    Casts HYALINE CASTS (*)    All other components within normal limits  COMPREHENSIVE METABOLIC PANEL - Abnormal; Notable for the following:    Sodium 128 (*)    Chloride 95 (*)    CO2 17 (*)    Total Protein 5.4 (*)    Albumin 2.1 (*)    Total Bilirubin 0.2 (*)    GFR calc non Af Amer 75 (*)  GFR calc Af Amer 87 (*)    Anion gap 16 (*)    All other components within normal limits  STOOL CULTURE  OVA AND PARASITE EXAMINATION  URINE CULTURE  CULTURE, EXPECTORATED SPUTUM-ASSESSMENT  LACTIC ACID, PLASMA  PROCALCITONIN  MAGNESIUM  PHOSPHORUS  CBC WITH DIFFERENTIAL  APTT  PROTIME-INR  TSH  GI PATHOGEN PANEL BY PCR, STOOL    Imaging Review Dg Chest Port 1 View  10/16/2014   CLINICAL DATA:  Shortness of breath.  EXAM: PORTABLE CHEST - 1 VIEW  COMPARISON:  07/18/2014  FINDINGS: The heart size and mediastinal contours are within normal limits. Calcified plaque noted within the aortic arch. Right lung is clear. There is of atelectasis in the left base. Advanced osteoarthritis involves both glenohumeral joints. The visualized skeletal structures are unremarkable.  IMPRESSION: Left base atelectasis.   Electronically Signed   By: Signa Kellaylor  Stroud M.D.   On: 10/16/2014 00:31     EKG Interpretation None      MDM   Final diagnoses:  Hyponatremia    3:29 PM Labs and stool culture pending. Vitals stable and patient afebrile. Patient will have fluids here.   Patient signed out to Ebbie Ridgehris Lawyer, PA-C.    Emilia BeckKaitlyn Minela [place], PA-C 10/16/14 [id number]  Tilden FossaElizabeth Rees, MD 10/22/14 [phone]

## 2014-10-15 NOTE — Progress Notes (Signed)
Attempted to call report,RN unavailable. Adonys Wildes, Drinda Buttsharito Joselita, RCharity fundraiser

## 2014-10-15 NOTE — ED Notes (Signed)
Pt arrives via EMS from home with diarrhea for the last week. Pt has tried imodium and pepto bismol with little relief in symptoms. Pt was scheduled for tib/fib fx repair with [facility] but was cancelled due to illness. Pt systolic bp in 90s initally, 300cc IVF recheck systolic in 120s. Pt awake, alert, oriented x4, HOH

## 2014-10-15 NOTE — H&P (Signed)
[facility] History and Physical  Kelli Pena ZOX:[id number] DOB: 04/13/1920 [name]: 10/15/2014  Referring physician: ER physician PCP: [name], MD   Chief Complaint: diarrhea, dehydration   HPI:  80 year old female with past medical history of hypertension, recent hospitalization in 07/2014 for sepsis secondary to perforated gastric ulcer. Left tibial fracture managed conservatively with splint who presented with complaints of 46 non bloody loose and watery BM as noted by patient's daughter over past 6 days PTA. Family reported patient had fever on sunday of 102 F and fever persisted for 24 hours and then she was afebrile since. No reports of cough but she did have sore throat and has had very poor po intake. No reports of abdominal pain, nausea or vomiting. No reports of blood in stool or urine. No GU complaints. No chest pain, no shortness of breath or palpitations. No lightheadedness or loss of consciousness.   In ED, BP was 47/16, HR 38-98, RR 15-26, T max 98.5 F and oxygen saturation 85% on [place] oxygen support. BP improved with IV fluids challenge to 126/41, HR 88 and oxygen saturation 95%. Blood work showed WBC count of 12.3, sodium 125, potassium 3.3 which was repleted in ED. Pt started on flagyl and cipro for possible GI infection and /or sepsis.   Assessment & Plan    Principal Problem: Sepsis/ leukocytosis  - possible C.diff or other GI pathology; sepsis criteria present on admission with vitals signs that included BP 47/16, HR 38-98, RR 15-26, oxygen saturation 85% on [place] oxygen support. Temperature was normal on admission but patient had fever of 102 F at home. In addition, WBC count elevated at 12.3. Suspected source of infection - GI. Obtain stool for C.diff, GI pathogen panel. Obtain lactic acid level, procalcitonin level. - order placed for flagyl 500 mg IV Q 12 hours and cipro 400 mg IV Q 12 hours. Please narrow down as soon as stool panel results, blood and urine  culture results available. - order placed for blood cultures, urine culture, CXR.  - hemodynamically stable; BP normalized to 114/46, HR 88. Will admit to telemetry. - will continue IV fluids for next 12 hours and reassess in am if fluids still needed Active Problems:   Hypothyroidism - resume synthroid - check TSH   Essential hypertension, benign - hold antihypertensives due to hypotension    Neuropathy / depression - resume lyrica and Cymbalta    Tibia fracture, LLE - stable non surgical intervention with splint    Atrial fibrillation - on Cardizem but this on hold due to soft BP - not on anticoagulation due to recent history of GI bleed; also risk of falls   Hyponatremia - likely dehydration, GI losses - supportive care with IV fluids - follow up BMP in am    Hypokalemia - secondary to GI losses - supplemented - follow up BMP in am   Severe protein calorie malnutrition - poor PO intake over past 1 week or so due to ongoing diarrhea, dehydration    DVT prophylaxis:  - SCD's bilaterally   Radiological Exams on Admission: No results found.  EKG: not yet done; order placed for admission EKG  Code Status: Full Family Communication: Plan of care discussed with the patient  Disposition Plan: Admit for further evaluation  Manson Passey, MD  [facility] Pager [phone]  Review of Systems:  Constitutional: positive for fever, chills and malaise/fatigue. Negative for diaphoresis.  HENT: Negative for hearing loss, ear pain, nosebleeds, congestion, sore throat, neck  pain, tinnitus and ear discharge.   Eyes: Negative for blurred vision, double vision, photophobia, pain, discharge and redness.  Respiratory: Negative for cough, hemoptysis, sputum production, shortness of breath, wheezing and stridor.   Cardiovascular: Negative for chest pain, palpitations, orthopnea, claudication and leg swelling.  Gastrointestinal: Negative for nausea, vomiting and abdominal pain. Negative  for heartburn, constipation, blood in stool and melena.  Genitourinary: Negative for dysuria, urgency, frequency, hematuria and flank pain.  Musculoskeletal: Negative for myalgias, back pain, joint pain and falls.  Skin: Negative for itching and rash.  Neurological: Negative for dizziness and weakness. Negative for tingling, tremors, sensory change, speech change, focal weakness, loss of consciousness and headaches.  Endo/Heme/Allergies: Negative for environmental allergies and polydipsia. Does not bruise/bleed easily.  Psychiatric/Behavioral: Negative for suicidal ideas. The patient is not nervous/anxious.      Past Medical History  Diagnosis Date  . Hypertension   . Diabetes mellitus without complication   . Arthritis   . Hypothyroid   . Fibromyalgia   . Complication of anesthesia     trouble waking up, very cold, hypotension  . Atrial fibrillation    Past Surgical History  Procedure Laterality Date  . Hip surgery    . Cholecystectomy  1980s?    open chole  . Eye surgery     Social History:  reports that she quit smoking about 48 years ago. Her smoking use included Cigarettes. She has a 30 pack-year smoking history. She has never used smokeless tobacco. She reports that she drinks alcohol. She reports that she does not use illicit drugs.  Allergies  Allergen Reactions  . Tetanus Toxoids Swelling    Family History:  Family History  Problem Relation Age of Onset  . Stroke Father   . Arthritis Sister   . Arthritis Daughter      Prior to Admission medications   Medication Sig Start Date End Date Taking? Authorizing Provider  aspirin 325 MG EC tablet Take 325 mg by mouth daily.   Yes Historical Provider, MD  diclofenac sodium (VOLTAREN) 1 % GEL Apply 4 g topically 4 (four) times daily as needed (for pain).    Yes Historical Provider, MD  diltiazem (CARDIZEM CD) 120 MG 24 hr capsule Take 1 capsule (120 mg total) by mouth daily. 07/28/14  Yes Kathlen ModyVijaya Akula, MD  DULoxetine  (CYMBALTA) 60 MG capsule Take 60 mg by mouth every morning.  12/12/13 12/12/14 Yes Gillian Scarceobyn K Zanard, MD  furosemide (LASIX) 40 MG tablet Take 40 mg by mouth daily.   Yes Historical Provider, MD  gabapentin (NEURONTIN) 600 MG tablet Take 600 mg by mouth at bedtime.    Yes Historical Provider, MD  HYDROcodone-acetaminophen (NORCO/VICODIN) 5-325 MG per tablet Take one tablet by mouth every 4 hours as needed for pain 08/13/14  Yes Mahima Pandey, MD  levothyroxine (SYNTHROID, LEVOTHROID) 75 MCG tablet Take 75 mcg by mouth daily before breakfast.   Yes Historical Provider, MD  lidocaine (LIDODERM) 5 % Place 1 patch onto the skin daily as needed (for pain). Apply to both knees and right shoulder. Remove & Discard patch within 12 hours or as directed by MD   Yes Historical Provider, MD  Multiple Vitamin (MULTIVITAMIN) tablet Take 1 tablet by mouth daily.   Yes Historical Provider, MD  pantoprazole (PROTONIX) 40 MG tablet Take 1 tablet (40 mg total) by mouth 2 (two) times daily. 07/28/14  Yes Kathlen ModyVijaya Akula, MD  polyethylene glycol (MIRALAX / GLYCOLAX) packet Take 8.5-17 g by mouth daily as needed  for moderate constipation.  12/28/12  Yes Joseph ArtJessica U Vann, DO  potassium gluconate (SM POTASSIUM) 595 MG TABS tablet Take 595 mg by mouth daily.   Yes Historical Provider, MD  traMADol (ULTRAM) 50 MG tablet Take 50 mg by mouth every 6 (six) hours.   Yes Historical Provider, MD  Vitamin D, Ergocalciferol, (DRISDOL) 50000 UNITS CAPS capsule Take 50,000 Units by mouth every 7 (seven) days. Saturday. 12/12/13 12/12/14 Yes Gillian Scarceobyn K Zanard, MD   Physical Exam: Filed Vitals:   10/15/14 2130 10/15/14 2145 10/15/14 2200 10/15/14 2215  BP: 114/43 47/16 105/55 114/46  Pulse: 38 83 95 88  Temp:      TempSrc:      Resp: 21 18 17 23   SpO2: 85% 95% 98% 95%    Physical Exam  Constitutional: Appears well-developed and well-nourished. No distress.  HENT: Normocephalic. No tonsillar erythema or exudates Eyes: Conjunctivae and EOM are  normal. PERRLA, no scleral icterus.  Neck: Normal ROM. Neck supple. No JVD. No tracheal deviation. No thyromegaly.  CVS: irregular rhythm, rate controlled, S1/S2 appreciated  Pulmonary: Effort and breath sounds normal, no stridor, rhonchi, wheezes, rales.  Abdominal: Soft. BS +,  no distension, tenderness, rebound or guarding.  Musculoskeletal: Normal range of motion. No edema and no tenderness. LLE stabilized in splint Lymphadenopathy: No lymphadenopathy noted, cervical, inguinal. Neuro: Alert. Normal reflexes, muscle tone coordination. No focal neurologic deficits. Skin: Skin is warm and dry. No rash noted. Not diaphoretic. No erythema. No pallor.  Psychiatric: Normal mood and affect. Behavior, judgment, thought content normal.   Labs on Admission:  Basic Metabolic Panel:  Recent Labs Lab 10/15/14 1622  NA 125*  K 3.3*  CL 89*  CO2 21  GLUCOSE 129*  BUN 18  CREATININE 0.74  CALCIUM 8.4   Liver Function Tests: No results for input(s): AST, ALT, ALKPHOS, BILITOT, PROT, ALBUMIN in the last 168 hours. No results for input(s): LIPASE, AMYLASE in the last 168 hours. No results for input(s): AMMONIA in the last 168 hours. CBC:  Recent Labs Lab 10/15/14 1622  WBC 12.3*  NEUTROABS 10.5*  HGB 13.0  HCT 37.9  MCV 81.0  PLT 407*   Cardiac Enzymes: No results for input(s): CKTOTAL, CKMB, CKMBINDEX, TROPONINI in the last 168 hours. BNP: Invalid input(s): POCBNP CBG: No results for input(s): GLUCAP in the last 168 hours.  If 7PM-7AM, please contact night-coverage [email or web address] Password [email or web address] 10/15/2014, 11:03 PM

## 2014-10-16 ENCOUNTER — Inpatient Hospital Stay (HOSPITAL_COMMUNITY): Payer: Medicare Other

## 2014-10-16 DIAGNOSIS — I4891 Unspecified atrial fibrillation: Secondary | ICD-10-CM

## 2014-10-16 LAB — COMPREHENSIVE METABOLIC PANEL
ALT: 8 U/L (ref 0–35)
AST: 22 U/L (ref 0–37)
Albumin: 2.1 g/dL — ABNORMAL LOW (ref 3.5–5.2)
Alkaline Phosphatase: 90 U/L (ref 39–117)
Anion gap: 16 — ABNORMAL HIGH (ref 5–15)
BUN: 14 mg/dL (ref 6–23)
CALCIUM: 8.5 mg/dL (ref 8.4–10.5)
CO2: 17 mEq/L — ABNORMAL LOW (ref 19–32)
Chloride: 95 mEq/L — ABNORMAL LOW (ref 96–112)
Creatinine, Ser: 0.65 mg/dL (ref 0.50–1.10)
GFR calc Af Amer: 87 mL/min — ABNORMAL LOW (ref >90)
GFR calc non Af Amer: 75 mL/min — ABNORMAL LOW (ref >90)
Glucose, Bld: 74 mg/dL (ref 70–99)
Potassium: 5 mEq/L (ref 3.7–5.3)
SODIUM: 128 meq/L — AB (ref 137–147)
TOTAL PROTEIN: 5.4 g/dL — AB (ref 6.0–8.3)
Total Bilirubin: 0.2 mg/dL — ABNORMAL LOW (ref 0.3–1.2)

## 2014-10-16 LAB — CBC WITH DIFFERENTIAL/PLATELET
BASOS ABS: 0 10*3/uL (ref 0.0–0.1)
Basophils Relative: 0 % (ref 0–1)
Eosinophils Absolute: 0 10*3/uL (ref 0.0–0.7)
Eosinophils Relative: 0 % (ref 0–5)
HCT: 39.7 % (ref 36.0–46.0)
Hemoglobin: 13.1 g/dL (ref 12.0–15.0)
LYMPHS PCT: 5 % — AB (ref 12–46)
Lymphs Abs: 0.9 10*3/uL (ref 0.7–4.0)
MCH: 26.8 pg (ref 26.0–34.0)
MCHC: 33 g/dL (ref 30.0–36.0)
MCV: 81.4 fL (ref 78.0–100.0)
MONOS PCT: 6 % (ref 3–12)
Monocytes Absolute: 1.1 10*3/uL — ABNORMAL HIGH (ref 0.1–1.0)
Neutro Abs: 16.1 10*3/uL — ABNORMAL HIGH (ref 1.7–7.7)
Neutrophils Relative %: 89 % — ABNORMAL HIGH (ref 43–77)
Platelets: 456 10*3/uL — ABNORMAL HIGH (ref 150–400)
RBC: 4.88 MIL/uL (ref 3.87–5.11)
RDW: 15.2 % (ref 11.5–15.5)
WBC: 18.1 10*3/uL — AB (ref 4.0–10.5)

## 2014-10-16 LAB — LACTIC ACID, PLASMA: LACTIC ACID, VENOUS: 1.8 mmol/L (ref 0.5–2.2)

## 2014-10-16 LAB — PROCALCITONIN: PROCALCITONIN: 0.19 ng/mL

## 2014-10-16 LAB — MAGNESIUM: Magnesium: 1.8 mg/dL (ref 1.5–2.5)

## 2014-10-16 LAB — TSH: TSH: 1.51 u[IU]/mL (ref 0.350–4.500)

## 2014-10-16 LAB — PROTIME-INR
INR: 1.18 (ref 0.00–1.49)
PROTHROMBIN TIME: 15.1 s (ref 11.6–15.2)

## 2014-10-16 LAB — APTT: APTT: 30 s (ref 24–37)

## 2014-10-16 LAB — GLUCOSE, CAPILLARY: Glucose-Capillary: 73 mg/dL (ref 70–99)

## 2014-10-16 LAB — PHOSPHORUS: PHOSPHORUS: 2.6 mg/dL (ref 2.3–4.6)

## 2014-10-16 MED ORDER — LIDOCAINE 5 % EX PTCH
1.0000 | MEDICATED_PATCH | Freq: Every day | CUTANEOUS | Status: DC | PRN
  Filled 2014-10-16: qty 1

## 2014-10-16 MED ORDER — SODIUM CHLORIDE 0.9 % IV SOLN
INTRAVENOUS | Status: AC
  Administered 2014-10-16 (×2): via INTRAVENOUS

## 2014-10-16 MED ORDER — ONDANSETRON HCL 4 MG/2ML IJ SOLN: 4.0000 mg | Freq: Four times a day (QID) | INTRAMUSCULAR | Status: DC | PRN

## 2014-10-16 MED ORDER — VITAMIN D (ERGOCALCIFEROL) 1.25 MG (50000 UNIT) PO CAPS
50000.0000 [IU] | ORAL_CAPSULE | ORAL | Status: DC
  Administered 2014-10-18: 50000 [IU] via ORAL
  Filled 2014-10-16: qty 1

## 2014-10-16 MED ORDER — ONDANSETRON HCL 4 MG PO TABS
4.0000 mg | ORAL_TABLET | Freq: Four times a day (QID) | ORAL | Status: DC | PRN
  Filled 2014-10-16: qty 1

## 2014-10-16 MED ORDER — ACETAMINOPHEN 325 MG PO TABS: 650.0000 mg | ORAL_TABLET | Freq: Four times a day (QID) | ORAL | Status: DC | PRN

## 2014-10-16 MED ORDER — IPRATROPIUM-ALBUTEROL 0.5-2.5 (3) MG/3ML IN SOLN: 3.0000 mL | RESPIRATORY_TRACT | Status: DC | PRN

## 2014-10-16 MED ORDER — TRAMADOL HCL 50 MG PO TABS
50.0000 mg | ORAL_TABLET | Freq: Four times a day (QID) | ORAL | Status: DC
  Administered 2014-10-16 – 2014-10-20 (×14): 50 mg via ORAL
  Filled 2014-10-16 (×14): qty 1

## 2014-10-16 MED ORDER — DULOXETINE HCL 60 MG PO CPEP
60.0000 mg | ORAL_CAPSULE | Freq: Every day | ORAL | Status: DC
  Administered 2014-10-16 – 2014-10-20 (×5): 60 mg via ORAL
  Filled 2014-10-16 (×5): qty 1

## 2014-10-16 MED ORDER — LEVOTHYROXINE SODIUM 75 MCG PO TABS
75.0000 ug | ORAL_TABLET | Freq: Every day | ORAL | Status: DC
  Administered 2014-10-16 – 2014-10-20 (×5): 75 ug via ORAL
  Filled 2014-10-16 (×8): qty 1

## 2014-10-16 MED ORDER — DICLOFENAC SODIUM 1 % TD GEL
4.0000 g | Freq: Four times a day (QID) | TRANSDERMAL | Status: DC | PRN
  Filled 2014-10-16: qty 100

## 2014-10-16 MED ORDER — ASPIRIN EC 325 MG PO TBEC
325.0000 mg | DELAYED_RELEASE_TABLET | Freq: Every day | ORAL | Status: DC
Start: 2014-10-16 — End: 2014-10-20
  Administered 2014-10-16 – 2014-10-20 (×5): 325 mg via ORAL
  Filled 2014-10-16 (×5): qty 1

## 2014-10-16 MED ORDER — SODIUM CHLORIDE 0.9 % IJ SOLN
3.0000 mL | Freq: Two times a day (BID) | INTRAMUSCULAR | Status: DC
  Administered 2014-10-17 – 2014-10-18 (×4): 3 mL via INTRAVENOUS

## 2014-10-16 MED ORDER — ACETAMINOPHEN 650 MG RE SUPP: 650.0000 mg | Freq: Four times a day (QID) | RECTAL | Status: DC | PRN

## 2014-10-16 MED ORDER — PANTOPRAZOLE SODIUM 40 MG PO TBEC
40.0000 mg | DELAYED_RELEASE_TABLET | Freq: Two times a day (BID) | ORAL | Status: DC
  Administered 2014-10-16 – 2014-10-20 (×9): 40 mg via ORAL
  Filled 2014-10-16 (×8): qty 1

## 2014-10-16 MED ORDER — HYDROCODONE-ACETAMINOPHEN 5-325 MG PO TABS: 1.0000 | ORAL_TABLET | ORAL | Status: DC | PRN

## 2014-10-16 MED ORDER — GABAPENTIN 600 MG PO TABS
600.0000 mg | ORAL_TABLET | Freq: Every day | ORAL | Status: DC
  Administered 2014-10-16 – 2014-10-19 (×5): 600 mg via ORAL
  Filled 2014-10-16 (×6): qty 1

## 2014-10-16 MED ORDER — ADULT MULTIVITAMIN W/MINERALS CH
1.0000 | ORAL_TABLET | Freq: Every day | ORAL | Status: DC
  Administered 2014-10-16 – 2014-10-20 (×5): 1 via ORAL
  Filled 2014-10-16 (×5): qty 1

## 2014-10-16 NOTE — Plan of Care (Signed)
Problem: Phase I Progression Outcomes Goal: Initial discharge plan identified Outcome: Completed/Met Date Met:  10/16/14     

## 2014-10-16 NOTE — Progress Notes (Signed)
Patient's daughter requested to put pt on soft diet as pt doesn't have lower dentures.  Dr. Arbutus Leasat gave verbal order to change the diet to soft.

## 2014-10-16 NOTE — Plan of Care (Signed)
Problem: Phase I Progression Outcomes Goal: Voiding-avoid urinary catheter unless indicated Outcome: Completed/Met Date Met:  10/16/14     

## 2014-10-16 NOTE — Progress Notes (Addendum)
PROGRESS NOTE  Kelli Pena WUJ:[id number] DOB: [birth year] [name]: 10/15/2014 PCP: No PCP Per Patient  Assessment/Plan: SIRS -Suspect possible GI source of infection as patient has had diarrhea for the last 5-6 days -Await GI pathogen panel -Urinalysis negative for pyuria -Chest x-ray negative for consolidation -Check stool lactoferrin -d/c cipro, continue flagyl for now Diarrhea -stool lactoferrin -await GI pathogen panel -Continue IV fluids -hold furosemide Hyponatremia -Due to volume loss -Continue IV fluids Hypokalemia -Repleted -Follow BMP Atrial fibrillation  -continue aspirin -Not on anticoagulation due to recent history of GI bleed and risk of falls   -will restart diltiazem if blood pressure continues to improve-  essential hypertension -Blood pressure remains stable off antihypertensive medications Hypothyroidism  -Continue levothyroxine  left lower extremity tibia fracture -Present during the last admission -PT/OT -[facility]   Family Communication:  Daughter updated on phone Disposition Plan:   Home when medically stable       Procedures/Studies: Dg Chest Port 1 View  10/16/2014   CLINICAL DATA:  Shortness of breath.  EXAM: PORTABLE CHEST - 1 VIEW  COMPARISON:  07/18/2014  FINDINGS: The heart size and mediastinal contours are within normal limits. Calcified plaque noted within the aortic arch. Right lung is clear. There is of atelectasis in the left base. Advanced osteoarthritis involves both glenohumeral joints. The visualized skeletal structures are unremarkable.  IMPRESSION: Left base atelectasis.   Electronically Signed   By: Signa Kellaylor  Stroud M.D.   On: 10/16/2014 00:31         Subjective: Patient had 1 loose stool overnight and wonders stool this morning. She denies any chest pain, shortness breath, nausea, vomiting, abdominal pain, fevers, chills.   Objective: Filed Vitals:   10/16/14 0055 10/16/14  0143 10/16/14 0628 10/16/14 0911  BP: 119/55  122/54 144/32  Pulse: 86  94 116  Temp: 98.5 F (36.9 C)  100 F (37.8 C) 99.5 F (37.5 C)  TempSrc: Oral   Oral  Resp:   18 17  Height:  4\' 11"  (1.499 m)    Weight:  67.8 kg (149 lb 7.6 oz)    SpO2: 99%  96% 96%    Intake/Output Summary (Last 24 hours) at 10/16/14 0948 Last data filed at 10/16/14 0700  Gross per 24 hour  Intake 761.25 ml  Output      2 ml  Net 759.25 ml   Weight change:  Exam:   General:  Pt is alert, follows commands appropriately, not in acute distress  HEENT: No icterus, No thrush, No meningismus, [place]/AT  Cardiovascular: IRRR, S1/S2, no rubs, no gallops  Respiratory: bibasilar crackles, left greater than right. No wheezing. Good air movement.   Abdomen: Soft/+BS, non tender, non distended, no guarding  Extremities: No edema, No lymphangitis, No petechiae, No rashes, no synovitis  Data Reviewed: Basic Metabolic Panel:  Recent Labs Lab 10/15/14 1622 10/16/14 0525  NA 125* 128*  K 3.3* 5.0  CL 89* 95*  CO2 21 17*  GLUCOSE 129* 74  BUN 18 14  CREATININE 0.74 0.65  CALCIUM 8.4 8.5  MG  --  1.8  PHOS  --  2.6   Liver Function Tests:  Recent Labs Lab 10/16/14 0525  AST 22  ALT 8  ALKPHOS 90  BILITOT 0.2*  PROT 5.4*  ALBUMIN 2.1*   No results for input(s): LIPASE, AMYLASE in the last 168 hours. No results for input(s): AMMONIA in the last 168 hours.  CBC:  Recent Labs Lab 10/15/14 1622 10/16/14 0525  WBC 12.3* 18.1*  NEUTROABS 10.5* 16.1*  HGB 13.0 13.1  HCT 37.9 39.7  MCV 81.0 81.4  PLT 407* 456*   Cardiac Enzymes: No results for input(s): CKTOTAL, CKMB, CKMBINDEX, TROPONINI in the last 168 hours. BNP: Invalid input(s): POCBNP CBG:  Recent Labs Lab 10/16/14 0840  GLUCAP 73    Recent Results (from the past 240 hour(s))  Stool culture     Status: None (Preliminary result)   Collection Time: 10/15/14  4:11 PM  Result Value Ref Range Status   Specimen Description  PERIRECTAL  Final   Special Requests Normal  Final   Culture   Final    Culture reincubated for better growth Performed at [facility]    Report Status PENDING  Incomplete     Scheduled Meds: . aspirin  325 mg Oral Daily  . ciprofloxacin  400 mg Intravenous Q12H  . DULoxetine  60 mg Oral Daily  . gabapentin  600 mg Oral QHS  . levothyroxine  75 mcg Oral QAC breakfast  . metronidazole  500 mg Intravenous Q8H  . multivitamin with minerals  1 tablet Oral Daily  . pantoprazole  40 mg Oral BID  . potassium chloride  40 mEq Oral BID  . sodium chloride  3 mL Intravenous Q12H  . traMADol  50 mg Oral Q6H  . [START ON 10/18/2014] Vitamin D (Ergocalciferol)  50,000 Units Oral Q Sat   Continuous Infusions: . sodium chloride 75 mL/hr at 10/16/14 0107     Carlene Bickley, DO  [facility] Pager [phone]  If 7PM-7AM, please contact night-coverage [email or web address] Password [email or web address] 10/16/2014, 9:48 AM   LOS: 1 day

## 2014-10-16 NOTE — Progress Notes (Signed)
New Admission Note:   Arrival Method: stretcher from ED Mental Orientation: alert and oriented x3,forgetful Telemetry: Box #[id number] Assessment: Completed Skin:stage 2 on left buttock IV: L FA Pain: Denies Tubes: N/A Safety Measures: Safety Fall Prevention Plan has been given and discussed.                                                Admission: Completed [address] [place] Orientation: Patient has been orientated to the room, unit and staff.  Family: None at present  Orders have been reviewed and implemented. Will continue to monitor the patient. Call light has been placed within reach and bed alarm has been activated.   Toll BrothersCharito Dilana Mcphie BSN, RN-BC Phone number: [phone]

## 2014-10-17 ENCOUNTER — Inpatient Hospital Stay (HOSPITAL_COMMUNITY): Payer: Medicare Other

## 2014-10-17 DIAGNOSIS — R1013 Epigastric pain: Secondary | ICD-10-CM

## 2014-10-17 DIAGNOSIS — R109 Unspecified abdominal pain: Secondary | ICD-10-CM | POA: Insufficient documentation

## 2014-10-17 LAB — GI PATHOGEN PANEL BY PCR, STOOL
C difficile toxin A/B: POSITIVE
Campylobacter by PCR: NEGATIVE
Cryptosporidium by PCR: NEGATIVE
E COLI (STEC): NEGATIVE
E coli (ETEC) LT/ST: NEGATIVE
E coli 0157 by PCR: NEGATIVE
G LAMBLIA BY PCR: NEGATIVE
NOROVIRUS G1/G2: NEGATIVE
Rotavirus A by PCR: NEGATIVE
Salmonella by PCR: NEGATIVE
Shigella by PCR: NEGATIVE

## 2014-10-17 LAB — BASIC METABOLIC PANEL
Anion gap: 11 (ref 5–15)
BUN: 10 mg/dL (ref 6–23)
CO2: 20 mEq/L (ref 19–32)
CREATININE: 0.67 mg/dL (ref 0.50–1.10)
Calcium: 8.3 mg/dL — ABNORMAL LOW (ref 8.4–10.5)
Chloride: 97 mEq/L (ref 96–112)
GFR calc Af Amer: 86 mL/min — ABNORMAL LOW (ref >90)
GFR calc non Af Amer: 74 mL/min — ABNORMAL LOW (ref >90)
Glucose, Bld: 100 mg/dL — ABNORMAL HIGH (ref 70–99)
Potassium: 5.3 mEq/L (ref 3.7–5.3)
SODIUM: 128 meq/L — AB (ref 137–147)

## 2014-10-17 LAB — FECAL LACTOFERRIN, QUANT: Fecal Lactoferrin: POSITIVE

## 2014-10-17 LAB — CBC
HCT: 36 % (ref 36.0–46.0)
Hemoglobin: 11.8 g/dL — ABNORMAL LOW (ref 12.0–15.0)
MCH: 26.2 pg (ref 26.0–34.0)
MCHC: 32.8 g/dL (ref 30.0–36.0)
MCV: 80 fL (ref 78.0–100.0)
Platelets: 430 10*3/uL — ABNORMAL HIGH (ref 150–400)
RBC: 4.5 MIL/uL (ref 3.87–5.11)
RDW: 15.3 % (ref 11.5–15.5)
WBC: 17.7 10*3/uL — AB (ref 4.0–10.5)

## 2014-10-17 LAB — GLUCOSE, CAPILLARY: Glucose-Capillary: 94 mg/dL (ref 70–99)

## 2014-10-17 LAB — PROCALCITONIN: Procalcitonin: 0.23 ng/mL

## 2014-10-17 MED ORDER — IOHEXOL 300 MG/ML  SOLN
25.0000 mL | INTRAMUSCULAR | Status: AC
  Administered 2014-10-17 (×2): 25 mL via ORAL

## 2014-10-17 MED ORDER — IOHEXOL 300 MG/ML  SOLN
100.0000 mL | Freq: Once | INTRAMUSCULAR | Status: AC | PRN
  Administered 2014-10-17: 100 mL via INTRAVENOUS

## 2014-10-17 MED ORDER — BOOST / RESOURCE BREEZE PO LIQD
1.0000 | Freq: Two times a day (BID) | ORAL | Status: DC
  Administered 2014-10-18 – 2014-10-20 (×6): 1 via ORAL

## 2014-10-17 MED ORDER — GLUCERNA SHAKE PO LIQD: 237.0000 mL | Freq: Every day | ORAL | Status: DC

## 2014-10-17 NOTE — Care Management Note (Deleted)
CARE MANAGEMENT NOTE 10/17/2014  Patient:  Kelli Pena,Kelli Pena   Account Number:  [id number]  Date Initiated:  10/13/2014  Documentation initiated by:  [facility]  Subjective/Objective Assessment:   anemia     Action/Plan:   home oxygen, home alone  10/17/2014 Plan to d/c to SNF today.   Anticipated DC Date:  10/17/2014   Anticipated DC Plan:  SKILLED NURSING FACILITY      DC Planning Services  CM consult      Choice offered to / List presented to:             Status of service:  Completed, signed off Medicare Important Message given?  YES (If response is "NO", the following Medicare IM given date fields will be blank) Date Medicare IM given:  10/13/2014 Medicare IM given by:  [facility] Date Additional Medicare IM given:  10/17/2014 Additional Medicare IM given by:  [facility]  Discharge Disposition:  SKILLED NURSING FACILITY  Per UR Regulation:    If discussed at Long Length of Stay Meetings, dates discussed:    Comments:  10/13/2014 1541 Recommendation is for SNF. Isidoro DonningAlesia Shavis RN CCM Case Mgmt phone [phone]

## 2014-10-17 NOTE — Consult Note (Signed)
WOC wound consult note Reason for Consult: Consult requested for buttocks.  Sacrum and buttocks with generalized erythemia and patchy areas of partial thickness breakdown; appearance consistent with moisture associated skin damage. Wound type: Left buttock with full thickness wound; .2X.2X.1cm with yellow wound bed.  This is NOT a pressure ulcer; appearance consistent with a previous vesicle which has ruptured.  Yellow wound bed, small amt yellow drainage.  Pt is frequently incontinent and dressings become soiled.   Dressing procedure/placement/frequency: Foam dressing to protect, but if this becomes soiled, then leave off and apply barrier cream to protect and promote healing.  If site becomes painful or drainage large amt, then consider surgical consult at that time. Please re-consult if further assistance is needed.  Thank-you,  Cammie Mcgeeawn Haeleigh Streiff MSN, RN, CWOCN, [place] [facility]AP, CNS [phone]

## 2014-10-17 NOTE — Progress Notes (Addendum)
INITIAL NUTRITION ASSESSMENT  DOCUMENTATION CODES Per approved criteria  -Obesity Unspecified   INTERVENTION: Resource Breeze po BID, each supplement provides 250 kcal and 9 grams of protein RD to follow for nutrition care plan  NUTRITION DIAGNOSIS: Increased nutrient needs related to wound healing as evidenced by estimated nutrition needs  Goal: Pt to meet >/= 90% of their estimated nutrition needs   Monitor:  PO & supplemental intake, weight, labs, I/O's  Reason for Assessment: Malnutrition Screening Tool Report  80 y.o. female  Admitting Dx: Sepsis  ASSESSMENT: 80 year old Female with PMH of HTN, recent hospitalization in 07/2014 for sepsis secondary to perforated gastric ulcer. Left tibial fracture managed conservatively with splint who presented with complaints of 46 non bloody loose and watery BM as noted by patient's daughter over past 6 days PTA.  RD unable to obtain nutrition hx from patient.  Sleeping upon visit.  Per Malnutrition Screening Tool, pt eating poorly because of a decreased appetite and recent weight lost without trying.  Noted + diarrhea.  PO intake 50% per flowsheet records.  Per wt readings below, weight has been stable.  Pt also with full thickness wound to left buttocks.  Would benefit from addition of oral nutrition supplements.  RD to order.  RD unable to complete Nutrition Focused Physical Exam at this time.  Height: Ht Readings from Last 1 Encounters:  10/16/14 4\' 11"  (1.499 m)    Weight: Wt Readings from Last 1 Encounters:  10/16/14 150 lb 3.2 oz (68.13 kg)    Ideal Body Weight: 98 lb  % Ideal Body Weight: 153%  Wt Readings from Last 10 Encounters:  10/16/14 150 lb 3.2 oz (68.13 kg)  10/01/14 147 lb 9.6 oz (66.951 kg)  09/22/14 150 lb 3.2 oz (68.13 kg)  08/08/14 155 lb 6.4 oz (70.489 kg)  07/23/14 143 lb 11.8 oz (65.2 kg)  07/17/13 149 lb (67.586 kg)  03/21/13 150 lb 9.6 oz (68.312 kg)  12/26/12 155 lb 10.3 oz (70.6 kg)    Usual  Body Weight: 147 lb  % Usual Body Weight: 102%  BMI:  Body mass index is 30.32 kg/(m^2).  Estimated Nutritional Needs: Kcal: 1500-1700 Protein: 80-90 gm Fluid: 1.5-1.7 L  Skin: full thickness wound to left buttocks  Diet Order: Diet Heart  EDUCATION NEEDS: -No education needs identified at this time   Intake/Output Summary (Last 24 hours) at 10/17/14 1133 Last data filed at 10/17/14 0913  Gross per 24 hour  Intake   1000 ml  Output      0 ml  Net   1000 ml    Labs:   Recent Labs Lab 10/15/14 1622 10/16/14 0525 10/17/14 0458  NA 125* 128* 128*  K 3.3* 5.0 5.3  CL 89* 95* 97  CO2 21 17* 20  BUN 18 14 10   CREATININE 0.74 0.65 0.67  CALCIUM 8.4 8.5 8.3*  MG  --  1.8  --   PHOS  --  2.6  --   GLUCOSE 129* 74 100*    CBG (last 3)   Recent Labs  10/16/14 0840 10/17/14 0747  GLUCAP 73 94    Scheduled Meds: . aspirin  325 mg Oral Daily  . DULoxetine  60 mg Oral Daily  . gabapentin  600 mg Oral QHS  . levothyroxine  75 mcg Oral QAC breakfast  . metronidazole  500 mg Intravenous Q8H  . multivitamin with minerals  1 tablet Oral Daily  . pantoprazole  40 mg Oral BID  .  sodium chloride  3 mL Intravenous Q12H  . traMADol  50 mg Oral Q6H  . [START ON 10/18/2014] Vitamin D (Ergocalciferol)  50,000 Units Oral Q Sat    Continuous Infusions:   Past Medical History  Diagnosis Date  . Hypertension   . Diabetes mellitus without complication   . Arthritis   . Hypothyroid   . Fibromyalgia   . Complication of anesthesia     trouble waking up, very cold, hypotension  . Atrial fibrillation     Past Surgical History  Procedure Laterality Date  . Hip surgery    . Cholecystectomy  1980s?    open chole  . Eye surgery      Maureen ChattersKatie Xiomar Crompton, RD, LDN Pager #: [phone] After-Hours Pager #: [phone]

## 2014-10-17 NOTE — Plan of Care (Signed)
Problem: Phase I Progression Outcomes Goal: Pain controlled with appropriate interventions Outcome: Completed/Met Date Met:  10/17/14

## 2014-10-17 NOTE — Progress Notes (Signed)
PROGRESS NOTE  Kelli Pena UUV:[id number] DOB: 1920-07-01 [name]: 10/15/2014 PCP: No PCP Per Patient  Assessment/Plan: SIRS -Suspect possible GI source of infection as patient has had diarrhea for the last 5-6 days -Await GI pathogen panel -Urinalysis negative for pyuria -Chest x-ray negative for consolidation -Check stool lactoferrin--positive -d/c cipro, continue flagyl for now Diarrhea -stool lactoferrin--positive -await GI pathogen panel -Continue IV fluids -hold furosemide -10/17/2014-order CT abdomen and pelvis as the patient continues to have diarrhea and new abdominal pain with associated leukocytosis  Hyponatremia -Due to volume loss -Continue IV fluids Hypokalemia -Repleted -Follow BMP  -discontinue potassium supplement as potassium was trending up  Atrial fibrillation  -continue aspirin -Not on anticoagulation due to recent history of GI bleed and risk of falls  -will restart diltiazem if blood pressure continues to improve- -Remained rate controlled for the most part essential hypertension -Blood pressure remains stable off antihypertensive medications Hypothyroidism  -Continue levothyroxine  left lower extremity tibia fracture -Present during the last admission -PT/OT -[facility]presently in splint   Family Communication: Daughter updated on phone Disposition Plan: Home when medically stable         Procedures/Studies: Dg Chest Port 1 View  10/16/2014   CLINICAL DATA:  Shortness of breath.  EXAM: PORTABLE CHEST - 1 VIEW  COMPARISON:  07/18/2014  FINDINGS: The heart size and mediastinal contours are within normal limits. Calcified plaque noted within the aortic arch. Right lung is clear. There is of atelectasis in the left base. Advanced osteoarthritis involves both glenohumeral joints. The visualized skeletal structures are unremarkable.  IMPRESSION: Left base atelectasis.   Electronically Signed    By: Signa Kellaylor  Stroud M.D.   On: 10/16/2014 00:31         Subjective: Patient was able to tolerate that today is "black Friday".complains of some epigastric abdominal pain. Continues to have diarrhea. Denies fevers, chills, chest pain, short of breath, coughing, hemoptysis   Objective: Filed Vitals:   10/16/14 0911 10/16/14 2050 10/17/14 0608 10/17/14 0915  BP: 144/32 112/71 115/64 106/57  Pulse: 116 91 102 94  Temp: 99.5 F (37.5 C) 97.8 F (36.6 C) 97.3 F (36.3 C) 98.3 F (36.8 C)  TempSrc: Oral Oral Tympanic Oral  Resp: 17 16 17 20   Height:      Weight:  68.13 kg (150 lb 3.2 oz)    SpO2: 96% 98% 100% 97%    Intake/Output Summary (Last 24 hours) at 10/17/14 1011 Last data filed at 10/17/14 0913  Gross per 24 hour  Intake   1000 ml  Output      0 ml  Net   1000 ml   Weight change: 0.33 kg (11.7 oz) Exam:   General:  Pt is alert, follows commands appropriately, not in acute distress  HEENT: No icterus, No thrush, no meningismus, [place]/AT  Cardiovascular: IRRR, S1/S2, no rubs, no gallops  Respiratory: CTA bilaterally, no wheezing, no crackles, no rhonchi  Abdomen: Soft/+BS, epigastric tenderness without any rebound, non distended, no guarding  Extremities: No edema, No lymphangitis, No petechiae, No rashes, no synovitis  Data Reviewed: Basic Metabolic Panel:  Recent Labs Lab 10/15/14 1622 10/16/14 0525 10/17/14 0458  NA 125* 128* 128*  K 3.3* 5.0 5.3  CL 89* 95* 97  CO2 21 17* 20  GLUCOSE 129* 74 100*  BUN 18 14 10   CREATININE 0.74 0.65 0.67  CALCIUM 8.4 8.5 8.3*  MG  --  1.8  --  PHOS  --  2.6  --    Liver Function Tests:  Recent Labs Lab 10/16/14 0525  AST 22  ALT 8  ALKPHOS 90  BILITOT 0.2*  PROT 5.4*  ALBUMIN 2.1*   No results for input(s): LIPASE, AMYLASE in the last 168 hours. No results for input(s): AMMONIA in the last 168 hours. CBC:  Recent Labs Lab 10/15/14 1622 10/16/14 0525 10/17/14 0458  WBC 12.3* 18.1* 17.7*    NEUTROABS 10.5* 16.1*  --   HGB 13.0 13.1 11.8*  HCT 37.9 39.7 36.0  MCV 81.0 81.4 80.0  PLT 407* 456* 430*   Cardiac Enzymes: No results for input(s): CKTOTAL, CKMB, CKMBINDEX, TROPONINI in the last 168 hours. BNP: Invalid input(s): POCBNP CBG:  Recent Labs Lab 10/16/14 0840 10/17/14 0747  GLUCAP 73 94    Recent Results (from the past 240 hour(s))  Stool culture     Status: None (Preliminary result)   Collection Time: 10/15/14  4:11 PM  Result Value Ref Range Status   Specimen Description PERIRECTAL  Final   Special Requests Normal  Final   Culture   Final    Culture reincubated for better growth Performed at [facility]    Report Status PENDING  Incomplete     Scheduled Meds: . aspirin  325 mg Oral Daily  . DULoxetine  60 mg Oral Daily  . gabapentin  600 mg Oral QHS  . levothyroxine  75 mcg Oral QAC breakfast  . metronidazole  500 mg Intravenous Q8H  . multivitamin with minerals  1 tablet Oral Daily  . pantoprazole  40 mg Oral BID  . sodium chloride  3 mL Intravenous Q12H  . traMADol  50 mg Oral Q6H  . [START ON 10/18/2014] Vitamin D (Ergocalciferol)  50,000 Units Oral Q Sat   Continuous Infusions:    Skie Vitrano, DO  [facility] Pager [phone]  If 7PM-7AM, please contact night-coverage [email or web address] Password [email or web address] 10/17/2014, 10:11 AM   LOS: 2 days

## 2014-10-17 NOTE — Care Management Note (Signed)
CARE MANAGEMENT NOTE 10/17/2014  Patient:  Kelli Pena,Kelli Pena   Account Number:  [id number]  Date Initiated:  10/17/2014  Documentation initiated by:  Gianlucas Evenson  Subjective/Objective Assessment:   CM following for progression and d/c planning.     Action/Plan:   IM given as pt is day #2 and may be ready for d/c over the weekend.   Anticipated DC Date:  10/19/2014   Anticipated DC Plan:  HOME W HOME HEALTH SERVICES         Choice offered to / List presented to:             Status of service:  In process, will continue to follow Medicare Important Message given?  YES (If response is "NO", the following Medicare IM given date fields will be blank) Date Medicare IM given:  10/17/2014 Medicare IM given by:  Lorann Tani Date Additional Medicare IM given:   Additional Medicare IM given by:    Discharge Disposition:    Per UR Regulation:    If discussed at Long Length of Stay Meetings, dates discussed:    Comments:  10/17/2014 CM following will need to eval pt mobility prior to decision on d/c needs.   CRoyal RN MPH, case manager, [phone]

## 2014-10-18 DIAGNOSIS — A047 Enterocolitis due to Clostridium difficile: Secondary | ICD-10-CM

## 2014-10-18 DIAGNOSIS — A0472 Enterocolitis due to Clostridium difficile, not specified as recurrent: Secondary | ICD-10-CM

## 2014-10-18 LAB — BASIC METABOLIC PANEL
Anion gap: 12 (ref 5–15)
BUN: 7 mg/dL (ref 6–23)
CALCIUM: 8.3 mg/dL — AB (ref 8.4–10.5)
CO2: 19 meq/L (ref 19–32)
Chloride: 95 mEq/L — ABNORMAL LOW (ref 96–112)
Creatinine, Ser: 0.6 mg/dL (ref 0.50–1.10)
GFR calc Af Amer: 89 mL/min — ABNORMAL LOW (ref >90)
GFR calc non Af Amer: 77 mL/min — ABNORMAL LOW (ref >90)
Glucose, Bld: 102 mg/dL — ABNORMAL HIGH (ref 70–99)
Potassium: 4.7 mEq/L (ref 3.7–5.3)
SODIUM: 126 meq/L — AB (ref 137–147)

## 2014-10-18 LAB — GLUCOSE, CAPILLARY: Glucose-Capillary: 98 mg/dL (ref 70–99)

## 2014-10-18 LAB — CBC
HEMATOCRIT: 36.8 % (ref 36.0–46.0)
Hemoglobin: 12.3 g/dL (ref 12.0–15.0)
MCH: 27.2 pg (ref 26.0–34.0)
MCHC: 33.4 g/dL (ref 30.0–36.0)
MCV: 81.4 fL (ref 78.0–100.0)
Platelets: 442 10*3/uL — ABNORMAL HIGH (ref 150–400)
RBC: 4.52 MIL/uL (ref 3.87–5.11)
RDW: 15.3 % (ref 11.5–15.5)
WBC: 12.6 10*3/uL — ABNORMAL HIGH (ref 4.0–10.5)

## 2014-10-18 MED ORDER — METRONIDAZOLE 500 MG PO TABS
500.0000 mg | ORAL_TABLET | Freq: Three times a day (TID) | ORAL | Status: DC
  Administered 2014-10-18 – 2014-10-20 (×7): 500 mg via ORAL
  Filled 2014-10-18 (×9): qty 1

## 2014-10-18 MED ORDER — SODIUM CHLORIDE 0.9 % IV SOLN
INTRAVENOUS | Status: DC
  Administered 2014-10-18 – 2014-10-20 (×3): via INTRAVENOUS

## 2014-10-18 NOTE — Progress Notes (Signed)
PROGRESS NOTE  Kelli CruelJean F Pena HKV:[id number] DOB: Jun 18, 1920 [name]: 10/15/2014 PCP: No PCP Per Patient  Assessment/Plan: Sepsis -present at the time of admission -secondary to C.diff colitis -Await GI pathogen panel-->+Cdiff -Urinalysis negative for pyuria -Chest x-ray negative for consolidation -Check stool lactoferrin--positive -d/c cipro, continue flagyl  CDiff colitis -stool lactoferrin--positive -restart IV fluids -hold furosemide -10/17/2014-order CT abdomen and pelvis as the patient continues to have diarrhea and new abdominal pain with associated leukocytosis  -continue flagyl Hyponatremia -Due to volume loss -Restart IV fluids -hold furosemide Hypokalemia -Repleted -Follow BMP  -discontinue potassium supplement as potassium was trending up  Atrial fibrillation  -continue aspirin -Not on anticoagulation due to recent history of GI bleed and risk of falls  -will restart diltiazem if blood pressure continues to improve- -Remained rate controlled for the most part essential hypertension -Blood pressure remains stable off antihypertensive medications Hypothyroidism  -Continue levothyroxine  left lower extremity tibia fracture -Present during the last admission -PT/OT -[facility]presently in splint     Family Communication:   Daughter updated at beside Disposition Plan:   Home when medically stable       Procedures/Studies: Ct Abdomen Pelvis W Contrast  10/17/2014   CLINICAL DATA:  Generalized abdominal pain.  EXAM: CT ABDOMEN AND PELVIS WITH CONTRAST  TECHNIQUE: Multidetector CT imaging of the abdomen and pelvis was performed using the standard protocol following bolus administration of intravenous contrast.  CONTRAST:  100mL OMNIPAQUE IOHEXOL 300 MG/ML  SOLN  COMPARISON:  CT scan of July 22, 2014.  FINDINGS: Multilevel degenerative disc disease is noted in the lumbar spine. Minimal bilateral pleural  effusions are noted with adjacent subsegmental atelectasis.  Status post cholecystectomy. No focal abnormality is noted in the liver, spleen or pancreas. Adrenal glands appear normal. Stable calcified cyst seen involving lower pole right kidney. Stable simple cyst seen involving upper pole of right kidney. No hydronephrosis or renal obstruction is noted. No renal or ureteral calculi are noted. Mild diffuse wall thickening is seen throughout the colon suggesting colitis. Mild sigmoid diverticulosis is noted. There is no evidence of bowel obstruction.  Urinary bladder appears normal. Uterine atrophy is noted. Right hip prosthesis is noted. Atherosclerotic calcifications of abdominal aorta are noted without aneurysm formation. Urinary bladder is decompressed.  IMPRESSION: Mild diffuse wall thickening is seen throughout the colon consistent with colitis. This may be inflammatory or infectious.   Electronically Signed   By: Roque LiasJames  Green M.D.   On: 10/17/2014 21:31   Dg Chest Port 1 View  10/16/2014   CLINICAL DATA:  Shortness of breath.  EXAM: PORTABLE CHEST - 1 VIEW  COMPARISON:  07/18/2014  FINDINGS: The heart size and mediastinal contours are within normal limits. Calcified plaque noted within the aortic arch. Right lung is clear. There is of atelectasis in the left base. Advanced osteoarthritis involves both glenohumeral joints. The visualized skeletal structures are unremarkable.  IMPRESSION: Left base atelectasis.   Electronically Signed   By: Signa Kellaylor  Stroud M.D.   On: 10/16/2014 00:31         Subjective: Patient continues to have loose stool without any hematochezia or melena. Denies any fevers, chills, chest pain, shortness breath, nausea, vomiting, abdominal pain, dysuria, hematuria  Objective: Filed Vitals:   10/17/14 1751 10/17/14 2313 10/18/14 0514 10/18/14 0754  BP: 88/63 100/73 112/59 118/55  Pulse: 115 108 99 87  Temp: 98.6 F (37 C) 98.5 F (36.9 C) 98.3 F (  36.8 C) 98 F (36.7 C)    TempSrc: Oral Oral Oral Oral  Resp: 19 18 19 18   Height:      Weight:  70 kg (154 lb 5.2 oz)    SpO2: 99% 98% 98% 97%    Intake/Output Summary (Last 24 hours) at 10/18/14 1623 Last data filed at 10/17/14 1752  Gross per 24 hour  Intake      0 ml  Output      0 ml  Net      0 ml   Weight change: 1.87 kg (4 lb 2 oz) Exam:   General:  Pt is alert, follows commands appropriately, not in acute distress  HEENT: No icterus, No thrush, [place]/AT  Cardiovascular: IRRR, S1/S2, no rubs, no gallops  Respiratory: CTA bilaterally, no wheezing, no crackles, no rhonchi  Abdomen: Soft/+BS, non tender, non distended, no guarding  Extremities: No LE edema, No lymphangitis, No petechiae, No rashes, no synovitis  Data Reviewed: Basic Metabolic Panel:  Recent Labs Lab 10/15/14 1622 10/16/14 0525 10/17/14 0458 10/18/14 0327  NA 125* 128* 128* 126*  K 3.3* 5.0 5.3 4.7  CL 89* 95* 97 95*  CO2 21 17* 20 19  GLUCOSE 129* 74 100* 102*  BUN 18 14 10 7   CREATININE 0.74 0.65 0.67 0.60  CALCIUM 8.4 8.5 8.3* 8.3*  MG  --  1.8  --   --   PHOS  --  2.6  --   --    Liver Function Tests:  Recent Labs Lab 10/16/14 0525  AST 22  ALT 8  ALKPHOS 90  BILITOT 0.2*  PROT 5.4*  ALBUMIN 2.1*   No results for input(s): LIPASE, AMYLASE in the last 168 hours. No results for input(s): AMMONIA in the last 168 hours. CBC:  Recent Labs Lab 10/15/14 1622 10/16/14 0525 10/17/14 0458 10/18/14 0327  WBC 12.3* 18.1* 17.7* 12.6*  NEUTROABS 10.5* 16.1*  --   --   HGB 13.0 13.1 11.8* 12.3  HCT 37.9 39.7 36.0 36.8  MCV 81.0 81.4 80.0 81.4  PLT 407* 456* 430* 442*   Cardiac Enzymes: No results for input(s): CKTOTAL, CKMB, CKMBINDEX, TROPONINI in the last 168 hours. BNP: Invalid input(s): POCBNP CBG:  Recent Labs Lab 10/16/14 0840 10/17/14 0747 10/18/14 0752  GLUCAP 73 94 98    Recent Results (from the past 240 hour(s))  Stool culture     Status: None (Preliminary result)   Collection  Time: 10/15/14  4:11 PM  Result Value Ref Range Status   Specimen Description PERIRECTAL  Final   Special Requests Normal  Final   Culture   Final    NO SUSPICIOUS COLONIES, CONTINUING TO HOLD Performed at [facility]    Report Status PENDING  Incomplete     Scheduled Meds: . aspirin  325 mg Oral Daily  . DULoxetine  60 mg Oral Daily  . feeding supplement (RESOURCE BREEZE)  1 Container Oral BID BM  . gabapentin  600 mg Oral QHS  . levothyroxine  75 mcg Oral QAC breakfast  . metroNIDAZOLE  500 mg Oral 3 times per day  . multivitamin with minerals  1 tablet Oral Daily  . pantoprazole  40 mg Oral BID  . sodium chloride  3 mL Intravenous Q12H  . traMADol  50 mg Oral Q6H  . Vitamin D (Ergocalciferol)  50,000 Units Oral Q Sat   Continuous Infusions:    Sybel Standish, DO  [facility] Pager [phone]  If 7PM-7AM, please  contact night-coverage [email or web address] Password [email or web address] 10/18/2014, 4:23 PM   LOS: 3 days

## 2014-10-19 LAB — BASIC METABOLIC PANEL
Anion gap: 11 (ref 5–15)
BUN: 4 mg/dL — ABNORMAL LOW (ref 6–23)
CALCIUM: 7.9 mg/dL — AB (ref 8.4–10.5)
CO2: 22 mEq/L (ref 19–32)
Chloride: 97 mEq/L (ref 96–112)
Creatinine, Ser: 0.63 mg/dL (ref 0.50–1.10)
GFR calc Af Amer: 88 mL/min — ABNORMAL LOW (ref >90)
GFR calc non Af Amer: 76 mL/min — ABNORMAL LOW (ref >90)
GLUCOSE: 110 mg/dL — AB (ref 70–99)
Potassium: 4 mEq/L (ref 3.7–5.3)
Sodium: 130 mEq/L — ABNORMAL LOW (ref 137–147)

## 2014-10-19 LAB — CBC
HEMATOCRIT: 35.1 % — AB (ref 36.0–46.0)
HEMOGLOBIN: 11.7 g/dL — AB (ref 12.0–15.0)
MCH: 26.2 pg (ref 26.0–34.0)
MCHC: 33.3 g/dL (ref 30.0–36.0)
MCV: 78.7 fL (ref 78.0–100.0)
Platelets: 451 10*3/uL — ABNORMAL HIGH (ref 150–400)
RBC: 4.46 MIL/uL (ref 3.87–5.11)
RDW: 15.3 % (ref 11.5–15.5)
WBC: 8.5 10*3/uL (ref 4.0–10.5)

## 2014-10-19 LAB — GLUCOSE, CAPILLARY: Glucose-Capillary: 110 mg/dL — ABNORMAL HIGH (ref 70–99)

## 2014-10-19 LAB — STOOL CULTURE: Special Requests: NORMAL

## 2014-10-19 MED ORDER — BOOST / RESOURCE BREEZE PO LIQD: 1.0000 | Freq: Two times a day (BID) | ORAL | Status: DC

## 2014-10-19 NOTE — Plan of Care (Signed)
Problem: Phase I Progression Outcomes Goal: OOB as tolerated unless otherwise ordered Outcome: Completed/Met Date Met:  10/19/14  Problem: Phase II Progression Outcomes Goal: Progress activity as tolerated unless otherwise ordered Outcome: Completed/Met Date Met:  10/19/14 Goal: Tolerating diet Outcome: Completed/Met Date Met:  10/19/14 Goal: Discharge plan established Outcome: Completed/Met Date Met:  10/19/14 Goal: Voiding independently Outcome: Completed/Met Date Met:  10/19/14

## 2014-10-19 NOTE — Progress Notes (Signed)
PROGRESS NOTE  Kelli Pena:[id number] DOB: 07-Jul-1920 [name]: 10/15/2014 PCP: No PCP Per Patient  Assessment/Plan: Sepsis -present at the time of admission -secondary to C.diff colitis -Await GI pathogen panel-->+Cdiff -Urinalysis negative for pyuria -Chest x-ray negative for consolidation -Check stool lactoferrin--positive -d/c cipro, continue flagyl  CDiff colitis -stool lactoferrin--positive -continue IV fluids -hold furosemide -10/17/2014-order CT abdomen and pelvis as the patient continues to have diarrhea and new abdominal pain with associated leukocytosis  -continue flagyl Hyponatremia -Due to volume loss -Restart IV fluids-->improving -hold furosemide Hypokalemia -Repleted -Follow BMP  -discontinue potassium supplement as potassium was trending up  Atrial fibrillation  -continue aspirin -Not on anticoagulation due to recent history of GI bleed and risk of falls  -will restart diltiazem if blood pressure continues to improve- -Remained rate controlled for the most part essential hypertension -Blood pressure remains stable off antihypertensive medications Hypothyroidism  -Continue levothyroxine  left lower extremity tibia fracture -Present during the last admission -PT/OT -[facility] discharged [facility] -presently in splint  Family Communication: Daughter updated on phone Disposition Plan: Home when medically stable         Procedures/Studies: Ct Abdomen Pelvis W Contrast  10/17/2014   CLINICAL DATA:  Generalized abdominal pain.  EXAM: CT ABDOMEN AND PELVIS WITH CONTRAST  TECHNIQUE: Multidetector CT imaging of the abdomen and pelvis was performed using the standard protocol following bolus administration of intravenous contrast.  CONTRAST:  OMNIPAQUE IOHEXOL 300 MG/ML  SOLN  COMPARISON:  CT scan of July 22, 2014.  FINDINGS: Multilevel degenerative disc disease is noted in the lumbar spine. Minimal bilateral  pleural effusions are noted with adjacent subsegmental atelectasis.  Status post cholecystectomy. No focal abnormality is noted in the liver, spleen or pancreas. Adrenal glands appear normal. Stable calcified cyst seen involving lower pole right kidney. Stable simple cyst seen involving upper pole of right kidney. No hydronephrosis or renal obstruction is noted. No renal or ureteral calculi are noted. Mild diffuse wall thickening is seen throughout the colon suggesting colitis. Mild sigmoid diverticulosis is noted. There is no evidence of bowel obstruction.  Urinary bladder appears normal. Uterine atrophy is noted. Right hip prosthesis is noted. Atherosclerotic calcifications of abdominal aorta are noted without aneurysm formation. Urinary bladder is decompressed.  IMPRESSION: Mild diffuse wall thickening is seen throughout the colon consistent with colitis. This may be inflammatory or infectious.   Electronically Signed   By: Roque Lias M.D.   On: 10/17/2014 21:31   Dg Chest Port 1 View  10/16/2014   CLINICAL DATA:  Shortness of breath.  EXAM: PORTABLE CHEST - 1 VIEW  COMPARISON:  07/18/2014  FINDINGS: The heart size and mediastinal contours are within normal limits. Calcified plaque noted within the aortic arch. Right lung is clear. There is of atelectasis in the left base. Advanced osteoarthritis involves both glenohumeral joints. The visualized skeletal structures are unremarkable.  IMPRESSION: Left base atelectasis.   Electronically Signed   By: Signa Kell M.D.   On: 10/16/2014 00:31         Subjective: Patient is doing better. Her bowel movements are less voluminous. Denies fevers, chills, chest pain, shortness breath, nausea, vomiting, abdominal pain  Objective: Filed Vitals:   10/18/14 1649 10/18/14 2045 10/19/14 0555 10/19/14 1015  BP: 104/90 99/67 109/50 123/72  Pulse: 93 96 98 100  Temp: 97.4 F (36.3 C) 97.6 F (36.4 C) 98.2 F (36.8 C) 97.8 F (36.6 C)  TempSrc: Oral  Oral  Oral Oral  Resp: 18 17 16 17   Height:      Weight:      SpO2: 98% 99% 99% 96%    Intake/Output Summary (Last 24 hours) at 10/19/14 1344 Last data filed at 10/19/14 1100  Gross per 24 hour  Intake    400 ml  Output      0 ml  Net    400 ml   Weight change:  Exam:   General:  Pt is alert, follows commands appropriately, not in acute distress  HEENT: No icterus, No thrush,  [place]/AT  Cardiovascular: RRR, S1/S2, no rubs, no gallops  Respiratory: CTA bilaterally, no wheezing, no crackles, no rhonchi  Abdomen: Soft/+BS, non tender, non distended, no guarding  Extremities: trace LE edema, No lymphangitis, No petechiae, No rashes, no synovitis  Data Reviewed: Basic Metabolic Panel:  Recent Labs Lab 10/15/14 1622 10/16/14 0525 10/17/14 0458 10/18/14 0327 10/19/14 0359  NA 125* 128* 128* 126* 130*  K 3.3* 5.0 5.3 4.7 4.0  CL 89* 95* 97 95* 97  CO2 21 17* 20 19 22   GLUCOSE 129* 74 100* 102* 110*  BUN 18 14 10 7  4*  CREATININE 0.74 0.65 0.67 0.60 0.63  CALCIUM 8.4 8.5 8.3* 8.3* 7.9*  MG  --  1.8  --   --   --   PHOS  --  2.6  --   --   --    Liver Function Tests:  Recent Labs Lab 10/16/14 0525  AST 22  ALT 8  ALKPHOS 90  BILITOT 0.2*  PROT 5.4*  ALBUMIN 2.1*   No results for input(s): LIPASE, AMYLASE in the last 168 hours. No results for input(s): AMMONIA in the last 168 hours. CBC:  Recent Labs Lab 10/15/14 1622 10/16/14 0525 10/17/14 0458 10/18/14 0327 10/19/14 0359  WBC 12.3* 18.1* 17.7* 12.6* 8.5  NEUTROABS 10.5* 16.1*  --   --   --   HGB 13.0 13.1 11.8* 12.3 11.7*  HCT 37.9 39.7 36.0 36.8 35.1*  MCV 81.0 81.4 80.0 81.4 78.7  PLT 407* 456* 430* 442* 451*   Cardiac Enzymes: No results for input(s): CKTOTAL, CKMB, CKMBINDEX, TROPONINI in the last 168 hours. BNP: Invalid input(s): POCBNP CBG:  Recent Labs Lab 10/16/14 0840 10/17/14 0747 10/18/14 0752 10/19/14 0755  GLUCAP 73 94 98 110*    Recent Results (from the past 240 hour(s))    Stool culture     Status: None   Collection Time: 10/15/14  4:11 PM  Result Value Ref Range Status   Specimen Description PERIRECTAL  Final   Special Requests Normal  Final   Culture   Final    NO SALMONELLA, SHIGELLA, CAMPYLOBACTER, YERSINIA, OR E.COLI 0157:H7 ISOLATED Performed at [facility]    Report Status 10/19/2014 FINAL  Final     Scheduled Meds: . aspirin  325 mg Oral Daily  . DULoxetine  60 mg Oral Daily  . feeding supplement (RESOURCE BREEZE)  1 Container Oral BID BM  . gabapentin  600 mg Oral QHS  . levothyroxine  75 mcg Oral QAC breakfast  . metroNIDAZOLE  500 mg Oral 3 times per day  . multivitamin with minerals  1 tablet Oral Daily  . pantoprazole  40 mg Oral BID  . sodium chloride  3 mL Intravenous Q12H  . traMADol  50 mg Oral Q6H  . Vitamin D (Ergocalciferol)  50,000 Units Oral Q Sat   Continuous Infusions: . sodium chloride 100 mL/hr at  10/19/14 1257     Magalene Mclear, DO  [facility] Pager [phone]  If 7PM-7AM, please contact night-coverage [email or web address] Password [email or web address] 10/19/2014, 1:44 PM   LOS: 4 days

## 2014-10-19 NOTE — Discharge Summary (Signed)
Physician Discharge Summary  Kelli Pena AOZ:[id number] DOB: 1920-08-06 [name]: 10/15/2014  PCP: No PCP Per Patient  Admit date: 10/15/2014 Discharge date:10/20/14 Recommendations for Outpatient Follow-up:  1. Pt will need to follow up with PCP in 2 weeks post discharge 2. Please obtain BMP and CBC in one week   Discharge Diagnoses:  Sepsis -present at the time of admission -secondary to C.diff colitis -Await GI pathogen panel-->+Cdiff -Urinalysis negative for pyuria -Chest x-ray negative for consolidation -Check stool lactoferrin--positive  -d/c cipro, continue flagyl  CDiff colitis -stool lactoferrin--positive -continue IV fluids -hold furosemide -10/17/2014-order CT abdomen and pelvis as the patient continues to have diarrhea and new abdominal pain with associated leukocytosis  -continue flagyl po for 8 more days which will complete 10 days of therapy -Although the patient continued to have loose stools the actual amount was significantly decreased- Hyponatremia -Due to volume loss as well as poor solute intake  -Restart IV fluids-->improving -hold furosemide -Sodium 129 and a discharge, although I expect this to continue to improve as the patient's oral intake continues to improve  -Clinically, the patient is asymptomatic, and her serum sodium has continued to improve through the hospitalization with fluid hydration  Hypokalemia -Repleted -Follow BMP  -discontinue potassium supplement as potassium was trending up -Patient will restart her potassium supplement after discharge as she will resume her furosemide  Atrial fibrillation  -continue aspirin -Not on anticoagulation due to recent history of GI bleed and risk of falls  -will restart diltiazem as blood pressure continues to improve- -Remained rate controlled for the most part essential hypertension -Blood pressure remains stable off antihypertensive medications -Resume diltiazem after discharge    Hypothyroidism  -Continue levothyroxine  left lower extremity tibia fracture -Present during the last admission -PT/OT -[facility]ently [facility] -follow up with Dr. Charlann Boxerlin outpt  Discharge Condition: stable  Disposition: home  Diet:cardiac Wt Readings from Last 3 Encounters:  10/19/14 73.3 kg (161 lb 9.6 oz)  10/01/14 66.951 kg (147 lb 9.6 oz)  09/22/14 68.13 kg (150 lb 3.2 oz)    History of present illness:  80 year old female with past medical history of hypertension, recent hospitalization in 07/2014 for sepsis secondary to perforated gastric ulcer and Left tibial fracture managed conservatively with splint who presented with complaints of 48 (forty [age]) non bloody loose and watery BM as noted by patient's daughter over past 6 days PTA. There was no history of recent travel or exotic foods. GI pathogen panel was ordered and ultimately revealed positive C. difficile colitis. The patient was noted to be dehydrated with a serum sodium of 125 at the time of admission. The patient was started on intravenous fluids. The patient was started on oral Flagyl. The volume of her diarrhea improved. Her serum sodium gradually improved. Cardizem was initially held due to soft blood pressure. This will be restarted after discharge as her blood pressure improved. Her atrial fibrillation remained fairly well controlled during the hospitalization. She did have intermittent episodes of with a heart rate above 100, but this did improve with fluid resuscitation. Her furosemide was discontinued during hospitalization. This will be restarted after discharge.    Discharge Exam: Filed Vitals:   10/20/14 0900  BP: 113/47  Pulse: 84  Temp: 98.3 F (36.8 C)  Resp: 18   Filed Vitals:   10/19/14 1743 10/19/14 2012 10/20/14 0617 10/20/14 0900  BP: 119/60 105/67 140/87 113/47  Pulse: 101 97 96 84  Temp: 97.4 F (36.3 C)  98.3 F (36.8 C) 97.4 F (36.3 C) 98.3 F (36.8 C)   TempSrc: Oral Oral Oral Oral  Resp: 20 18 17 18   Height:      Weight:  73.3 kg (161 lb 9.6 oz)    SpO2: 100% 98% 98% 98%   General: A&O x 3, NAD, pleasant, cooperative Cardiovascular: RRR, no rub, no gallop, no S3 Respiratory: CTAB, no wheeze, no rhonchi Abdomen:soft, nontender, nondistended, positive bowel sounds Extremities: trace edema, No lymphangitis, no petechiae  Discharge Instructions      Discharge Instructions    Diet - low sodium heart healthy    Complete by:  As directed      Increase activity slowly    Complete by:  As directed             Medication List    TAKE these medications        aspirin 325 MG EC tablet  Take 325 mg by mouth daily.     diclofenac sodium 1 % Gel  Commonly known as:  VOLTAREN  Apply 4 g topically 4 (four) times daily as needed (for pain).     diltiazem 120 MG 24 hr capsule  Commonly known as:  CARDIZEM CD  Take 1 capsule (120 mg total) by mouth daily.     DULoxetine 60 MG capsule  Commonly known as:  CYMBALTA  Take 60 mg by mouth every morning.     feeding supplement (RESOURCE BREEZE) Liqd  Take 1 Container by mouth 2 (two) times daily between meals.     furosemide 40 MG tablet  Commonly known as:  LASIX  Take 40 mg by mouth daily.     gabapentin 600 MG tablet  Commonly known as:  NEURONTIN  Take 600 mg by mouth at bedtime.     HYDROcodone-acetaminophen 5-325 MG per tablet  Commonly known as:  NORCO/VICODIN  Take one tablet by mouth every 4 hours as needed for pain     levothyroxine 75 MCG tablet  Commonly known as:  SYNTHROID, LEVOTHROID  Take 75 mcg by mouth daily before breakfast.     lidocaine 5 %  Commonly known as:  LIDODERM  Place 1 patch onto the skin daily as needed (for pain). Apply to both knees and right shoulder. Remove & Discard patch within 12 hours or as directed by MD     metroNIDAZOLE 500 MG tablet  Commonly known as:  FLAGYL  Take 1 tablet (500 mg total) by mouth every 8 (eight) hours.      multivitamin tablet  Take 1 tablet by mouth daily.     pantoprazole 40 MG tablet  Commonly known as:  PROTONIX  Take 1 tablet (40 mg total) by mouth 2 (two) times daily.     polyethylene glycol packet  Commonly known as:  MIRALAX / GLYCOLAX  Take 8.5-17 g by mouth daily as needed for moderate constipation.     SM POTASSIUM 595 MG Tabs tablet  Generic drug:  potassium gluconate  Take 595 mg by mouth daily.     traMADol 50 MG tablet  Commonly known as:  ULTRAM  Take 50 mg by mouth every 6 (six) hours.     Vitamin D (Ergocalciferol) 50000 UNITS Caps capsule  Commonly known as:  DRISDOL  Take 50,000 Units by mouth every 7 (seven) days. Saturday.         The results of significant diagnostics from this hospitalization (including imaging, microbiology, ancillary and laboratory) are listed below for reference.  Significant Diagnostic Studies: Ct Abdomen Pelvis W Contrast  10/17/2014   CLINICAL DATA:  Generalized abdominal pain.  EXAM: CT ABDOMEN AND PELVIS WITH CONTRAST  TECHNIQUE: Multidetector CT imaging of the abdomen and pelvis was performed using the standard protocol following bolus administration of intravenous contrast.  CONTRAST:  OMNIPAQUE IOHEXOL 300 MG/ML  SOLN  COMPARISON:  CT scan of July 22, 2014.  FINDINGS: Multilevel degenerative disc disease is noted in the lumbar spine. Minimal bilateral pleural effusions are noted with adjacent subsegmental atelectasis.  Status post cholecystectomy. No focal abnormality is noted in the liver, spleen or pancreas. Adrenal glands appear normal. Stable calcified cyst seen involving lower pole right kidney. Stable simple cyst seen involving upper pole of right kidney. No hydronephrosis or renal obstruction is noted. No renal or ureteral calculi are noted. Mild diffuse wall thickening is seen throughout the colon suggesting colitis. Mild sigmoid diverticulosis is noted. There is no evidence of bowel obstruction.  Urinary bladder  appears normal. Uterine atrophy is noted. Right hip prosthesis is noted. Atherosclerotic calcifications of abdominal aorta are noted without aneurysm formation. Urinary bladder is decompressed.  IMPRESSION: Mild diffuse wall thickening is seen throughout the colon consistent with colitis. This may be inflammatory or infectious.   Electronically Signed   By: Roque Lias M.D.   On: 10/17/2014 21:31   Dg Chest Port 1 View  10/16/2014   CLINICAL DATA:  Shortness of breath.  EXAM: PORTABLE CHEST - 1 VIEW  COMPARISON:  07/18/2014  FINDINGS: The heart size and mediastinal contours are within normal limits. Calcified plaque noted within the aortic arch. Right lung is clear. There is of atelectasis in the left base. Advanced osteoarthritis involves both glenohumeral joints. The visualized skeletal structures are unremarkable.  IMPRESSION: Left base atelectasis.   Electronically Signed   By: Signa Kell M.D.   On: 10/16/2014 00:31     Microbiology: Recent Results (from the past 240 hour(s))  Stool culture     Status: None   Collection Time: 10/15/14  4:11 PM  Result Value Ref Range Status   Specimen Description PERIRECTAL  Final   Special Requests Normal  Final   Culture   Final    NO SALMONELLA, SHIGELLA, CAMPYLOBACTER, YERSINIA, OR E.COLI 0157:H7 ISOLATED Performed at [facility]    Report Status 10/19/2014 FINAL  Final  Ova and parasite examination     Status: None   Collection Time: 10/16/14 11:13 AM  Result Value Ref Range Status   Specimen Description STOOL  Final   Special Requests NONE  Final   Ova and parasites   Final    NO OVA OR PARASITES SEEN Performed at [facility]    Report Status 10/20/2014 FINAL  Final     Labs: Basic Metabolic Panel:  Recent Labs Lab 10/16/14 0525 10/17/14 0458 10/18/14 0327 10/19/14 0359 10/20/14 0402  NA 128* 128* 126* 130* 129*  K 5.0 5.3 4.7 4.0 4.1  CL 95* 97 95* 97 96  CO2 17* 20 19 22 20   GLUCOSE 74 100* 102* 110*  109*  BUN 14 10 7  4* 3*  CREATININE 0.65 0.67 0.60 0.63 0.51  CALCIUM 8.5 8.3* 8.3* 7.9* 7.7*  MG 1.8  --   --   --   --   PHOS 2.6  --   --   --   --    Liver Function Tests:  Recent Labs Lab 10/16/14 0525  AST 22  ALT 8  ALKPHOS 90  BILITOT 0.2*  PROT 5.4*  ALBUMIN 2.1*   No results for input(s): LIPASE, AMYLASE in the last 168 hours. No results for input(s): AMMONIA in the last 168 hours. CBC:  Recent Labs Lab 10/15/14 1622 10/16/14 0525 10/17/14 0458 10/18/14 0327 10/19/14 0359  WBC 12.3* 18.1* 17.7* 12.6* 8.5  NEUTROABS 10.5* 16.1*  --   --   --   HGB 13.0 13.1 11.8* 12.3 11.7*  HCT 37.9 39.7 36.0 36.8 35.1*  MCV 81.0 81.4 80.0 81.4 78.7  PLT 407* 456* 430* 442* 451*   Cardiac Enzymes: No results for input(s): CKTOTAL, CKMB, CKMBINDEX, TROPONINI in the last 168 hours. BNP: Invalid input(s): POCBNP CBG:  Recent Labs Lab 10/16/14 0840 10/17/14 0747 10/18/14 0752 10/19/14 0755 10/20/14 0738  GLUCAP 73 94 98 110* 118*    Time coordinating discharge:  Greater than 30 minutes  Signed:  Abdoulie Tierce, DO [facility] Pager: [phone] 10/20/2014, 3:00 PM

## 2014-10-19 NOTE — Evaluation (Addendum)
Physical Therapy Evaluation Patient Details Name: Kelli Pena MRN: [id number] DOB: 05/30/1920 Today's Date: 10/19/2014   History of Present Illness  Patient is a 80 y/o female with PMH of HTN, recent hospitalization in 07/2014 for sepsis secondary to perforated gastric ulcer and left tibial fracture managed conservatively with splint who presented with complaints of 46 non bloody loose and watery BM as noted by patient's daughter over past 6 days PTA. Admitted with sepsis secondary to + C.diff and dehydration.     Clinical Impression  Patient presents with functional limitations due to deficits listed in PT problem list (see below). Pt with generalized weakness, balance deficits and fatigue impacting safe mobility. Pt with increased effort to ambulate short distance with Mod A for balance/support/safety. Pt not safe to be home alone. If pt declines SNF and returns home, will need ambulance transport as pt not able to negotiate steps. Pt would benefit from skilled PT to improve transfers, gait, balance and mobility so pt can maximize independence and ease burden of care prior to return home.    Follow Up Recommendations SNF;Supervision/Assistance - 24 hour (If refusing SNF, pt need HHPT and 24/7 care.)    Equipment Recommendations  None recommended by PT    Recommendations for Other Services OT consult     Precautions / Restrictions Precautions Precautions: Fall Required Braces or Orthoses: Other Brace/Splint Other Brace/Splint: brace on LLE Restrictions Weight Bearing Restrictions: No Other Position/Activity Restrictions: No WB restrictions in chart. per pt, WBAt.      Mobility  Bed Mobility Overal bed mobility: Needs Assistance Bed Mobility: Rolling;Sidelying to Sit;Sit to Sidelying Rolling: Supervision Sidelying to sit: Min assist;HOB elevated     Sit to sidelying: Supervision;HOB elevated General bed mobility comments: Use of rails. Increased effort to pull up into  sitting. Min A for trunk.  Transfers Overall transfer level: Needs assistance Equipment used: Rolling walker (2 wheeled) Transfers: Sit to/from Stand Sit to Stand: Mod assist         General transfer comment: Mod A to stand from EOB and Max A from toilet with use of grab bar and RW for support. VC's for hand placement.   Ambulation/Gait Ambulation/Gait assistance: Mod assist Ambulation Distance (Feet): 16 Feet (x2 bouts) Assistive device: Rolling walker (2 wheeled) Gait Pattern/deviations: Step-to pattern;Trunk flexed;Decreased stride length Gait velocity: slow Gait velocity interpretation: Below normal speed for age/gender General Gait Details: Pt with genu recurvatum in BLEs during gait with difficulty advancing LLE at times. Constant Min A for weight shifting to assist with advancement. Min A to negotiate RW. Increased effort. HR elevated to 140s.  Stairs            Wheelchair Mobility    Modified Rankin (Stroke Patients Only)       Balance Overall balance assessment: Needs assistance Sitting-balance support: No upper extremity supported;Bilateral upper extremity supported Sitting balance-Leahy Scale: Fair     Standing balance support: During functional activity Standing balance-Leahy Scale: Poor Standing balance comment: Requires BUE support on RW for balance/safety.                             Pertinent Vitals/Pain Pain Assessment: No/denies pain    Home Living Family/patient expects to be discharged to:: Private residence Living Arrangements: Children (daughter) Available Help at Discharge: Family;Personal care attendant Type of Home: House Home Access: Stairs to enter Entrance Stairs-Rails: Right Entrance Stairs-Number of Steps: 3 Home Layout: One level Home Equipment: Wheelchair -  manual      Prior Function Level of Independence: Needs assistance   Gait / Transfers Assistance Needed: uses RW for ambulation.  ADL's / Homemaking  Assistance Needed: Daughter assists with ADLs and IADLs.        Hand Dominance   Dominant Hand: Right    Extremity/Trunk Assessment   Upper Extremity Assessment: Generalized weakness           Lower Extremity Assessment: Generalized weakness;RLE deficits/detail;LLE deficits/detail RLE Deficits / Details: Generally weak. Partial knee buckling with gait. Pt with genu recurvatum in standing, most notable during gait. LLE Deficits / Details: LLE in brace due to tibia fx tx conservatively. Ankle AROM not assessed. Pt with genu recurvatum in standing, most notable during gait.     Communication   Communication: HOH  Cognition Arousal/Alertness: Awake/alert Behavior During Therapy: WFL for tasks assessed/performed Overall Cognitive Status: No family/caregiver present to determine baseline cognitive functioning                      General Comments      Exercises        Assessment/Plan    PT Assessment Patient needs continued PT services  PT Diagnosis Abnormality of gait;Generalized weakness   PT Problem List Decreased strength;Decreased mobility;Decreased activity tolerance;Decreased balance;Cardiopulmonary status limiting activity  PT Treatment Interventions Gait training;Therapeutic exercise;Patient/family education;Therapeutic activities;Balance training;Stair training;Functional mobility training;Wheelchair mobility training   PT Goals (Current goals can be found in the Care Plan section) Acute Rehab PT Goals Patient Stated Goal: to get walking better PT Goal Formulation: With patient Time For Goal Achievement: 11/02/14 Potential to Achieve Goals: Fair    Frequency Min 3X/week   Barriers to discharge Decreased caregiver support      Co-evaluation               End of Session Equipment Utilized During Treatment: Gait belt Activity Tolerance: Patient limited by fatigue Patient left: in bed;with call bell/phone within reach;with bed alarm  set Nurse Communication: Mobility status         Time: 1600-1630 PT Time Calculation (min) (ACUTE ONLY): 30 min   Charges:   PT Evaluation $Initial PT Evaluation Tier I: 1 Procedure PT Treatments $Gait Training: 8-22 mins   PT G CodesAlvie Heidelberg:          Folan, Kollen Armenti A 10/19/2014, 4:39 PM Alvie HeidelbergShauna Folan, PT, DPT [phone]

## 2014-10-20 LAB — BASIC METABOLIC PANEL
ANION GAP: 13 (ref 5–15)
BUN: 3 mg/dL — AB (ref 6–23)
CHLORIDE: 96 meq/L (ref 96–112)
CO2: 20 meq/L (ref 19–32)
CREATININE: 0.51 mg/dL (ref 0.50–1.10)
Calcium: 7.7 mg/dL — ABNORMAL LOW (ref 8.4–10.5)
GFR calc Af Amer: >90 mL/min (ref >90)
GFR calc non Af Amer: 81 mL/min — ABNORMAL LOW (ref >90)
Glucose, Bld: 109 mg/dL — ABNORMAL HIGH (ref 70–99)
Potassium: 4.1 mEq/L (ref 3.7–5.3)
Sodium: 129 mEq/L — ABNORMAL LOW (ref 137–147)

## 2014-10-20 LAB — OVA AND PARASITE EXAMINATION: OVA AND PARASITES: NONE SEEN

## 2014-10-20 LAB — GLUCOSE, CAPILLARY: Glucose-Capillary: 118 mg/dL — ABNORMAL HIGH (ref 70–99)

## 2014-10-20 MED ORDER — METRONIDAZOLE 500 MG PO TABS: 500.0000 mg | ORAL_TABLET | Freq: Three times a day (TID) | ORAL | Status: DC

## 2014-10-20 MED ORDER — BOOST / RESOURCE BREEZE PO LIQD: 1.0000 | Freq: Two times a day (BID) | ORAL | Status: DC

## 2014-10-20 NOTE — Progress Notes (Signed)
Physical Therapy Treatment Patient Details Name: Kelli Pena MRN: [id number] DOB: Apr 24, 1920 Today's Date: 10/20/2014    History of Present Illness Patient is a 80 y/o female with PMH of HTN, recent hospitalization in 07/2014 for sepsis secondary to perforated gastric ulcer and left tibial fracture managed conservatively with splint who presented with complaints of 46 non bloody loose and watery BM as noted by patient's daughter over past 6 days PTA. Admitted with sepsis secondary to + C.diff and dehydration.     PT Comments    Pt progressing slowly towards physical therapy goals. Pt demonstrates fatigue when sitting up in the chair, as she had scooted/slid down to almost supine in the recliner. Pt required +2 assist to scoot herself back up to full sitting position. Pt to d/c home today with daughter. PT continues to recommend SNF at d/c for continued strengthening and gait training, however pt is declining at this time. Pt will require ambulance transport home as she is not able to negotiate steps at this time. Will continue to follow until discharge.   Follow Up Recommendations  SNF;Supervision/Assistance - 24 hour (If refusing SNF, pt need HHPT and 24/7 care.)     Equipment Recommendations  None recommended by PT    Recommendations for Other Services OT consult     Precautions / Restrictions Precautions Precautions: Fall Required Braces or Orthoses: Other Brace/Splint Other Brace/Splint: AFO on LLE Restrictions Weight Bearing Restrictions: No Other Position/Activity Restrictions: No WB restrictions in chart. per pt, WBAt.    Mobility  Bed Mobility Overal bed mobility: Needs Assistance Bed Mobility: Sit to Sidelying;Rolling Rolling: Supervision       Sit to sidelying: Min assist General bed mobility comments: Assist to elevate LE's into bed. +2 required to [facility] to [facility].   Transfers Overall transfer level: Needs assistance Equipment used: Rolling walker (2  wheeled) Transfers: Sit to/from Stand Sit to Stand: Min assist;+2 physical assistance         General transfer comment: Assist to stand at chair. Pt able to hold static stand for ~2 minutes while NT performed peri-care.   Ambulation/Gait Ambulation/Gait assistance: Mod assist;+2 safety/equipment Ambulation Distance (Feet): 15 Feet Assistive device: Rolling walker (2 wheeled) Gait Pattern/deviations: Decreased stride length;Step-to pattern;Trunk flexed Gait velocity: Decreased Gait velocity interpretation: Below normal speed for age/gender General Gait Details: Pt with genu recurvatum in BLEs during gait with difficulty advancing LLE at times. Constant Min-Mod A for weight shifting to assist with advancement. Min A to negotiate RW. Increased effort.   Stairs            Wheelchair Mobility    Modified Rankin (Stroke Patients Only)       Balance Overall balance assessment: Needs assistance Sitting-balance support: Feet supported;No upper extremity supported Sitting balance-Leahy Scale: Fair     Standing balance support: Bilateral upper extremity supported;During functional activity Standing balance-Leahy Scale: Poor                      Cognition Arousal/Alertness: Awake/alert Behavior During Therapy: WFL for tasks assessed/performed Overall Cognitive Status: No family/caregiver present to determine baseline cognitive functioning                      Exercises      General Comments        Pertinent Vitals/Pain Pain Assessment: No/denies pain    Home Living  Prior Function            PT Goals (current goals can now be found in the care plan section) Acute Rehab PT Goals Patient Stated Goal: to get walking better PT Goal Formulation: With patient Time For Goal Achievement: 11/02/14 Potential to Achieve Goals: Fair Progress towards PT goals: Progressing toward goals    Frequency  Min 3X/week    PT  Plan Current plan remains appropriate    Co-evaluation             End of Session Equipment Utilized During Treatment: Gait belt Activity Tolerance: Patient limited by fatigue Patient left: in bed;with call bell/phone within reach;with nursing/sitter in room     Time: [id number] PT Time Calculation (min) (ACUTE ONLY): 28 min  Charges:  $Gait Training: 8-22 mins $Therapeutic Activity: 8-22 mins                    G Codes:      Conni SlipperKirkman, Amarie Viles 10/20/2014, 3:52 PM  Conni SlipperLaura Laure Leone, PT, DPT Acute Rehabilitation Services Pager: [phone]

## 2014-10-20 NOTE — Clinical Social Work Note (Signed)
Patient medically stable for discharge today and ambulance transport requested by family. Transport contacted and requested and CSW signing off as no other services requested.  Genelle BalVanessa Javin Nong, MSW, LCSW [phone]

## 2014-10-20 NOTE — Progress Notes (Signed)
Discharge instructions and medications discussed with patient's daughter.  All questions answered.  

## 2014-10-20 NOTE — Care Management Note (Signed)
CARE MANAGEMENT NOTE 10/20/2014  Patient:  RONI, FRIBERG   Account Number:  [id number]  Date Initiated:  10/17/2014  Documentation initiated by:  Donalda Job  Subjective/Objective Assessment:   CM following for progression and d/c planning.     Action/Plan:   IM given as pt is day #2 and may be ready for d/c over the weekend.  10/20/2014 IM given and HH resumed with [facility] as requested by pt daughter.   Anticipated DC Date:  10/20/2014   Anticipated DC Plan:  [place]         Choice offered to / List presented to:  C-4 Adult Children        HH arranged  HH-1 RN  [place]      [place] agency  [place]   Status of service:  Completed, signed off Medicare Important Message given?  YES (If response is "NO", the following Medicare IM given date fields will be blank) Date Medicare IM given:  10/17/2014 Medicare IM given by:  Milika Ventress Date Additional Medicare IM given:  10/20/2014 Additional Medicare IM given by:  [facility]  Discharge Disposition:  [place]  Per UR Regulation:    If discussed at Long Length of Stay Meetings, dates discussed:    Comments:    10/20/2014 Met with pt who is unable to recall the name of her [facility] agency. Pt daughter called and states that the agency was [place] and they wish to continue services with that agency. This CM contacted [facility] rep , Linus Mako. for services to resume.  CRoyal RN MPH, case manager, [phone]  10/17/2014 CM following will need to eval pt mobility prior to decision on d/c needs.   CRoyal RN MPH, case manager, [phone]

## 2014-10-30 ENCOUNTER — Other Ambulatory Visit: Payer: Self-pay | Admitting: Adult Health

## 2014-11-12 ENCOUNTER — Telehealth: Payer: Self-pay | Admitting: *Deleted

## 2014-11-12 NOTE — Telephone Encounter (Signed)
[name], daughter called and stated that K[facility] Pena was in [facility]. Patient is out of her Diltiazem and needs a refill and also she is having C-Diff and needs to be treated. I informed her that once patient is discharged from the facility they are no longer our patient and would need to establish with our office. Told her that she would have to call her PCP for refills and she stated that she had no PCP that he quit on them when she was in the facility. Advised her to take her to Urgent Care to treat the C-Diff and to get her refill on Diltiazem. Told her also that our practice would be glad to take her on as a patient, she stated that she would call back and schedule an appointment. She will go ahead and take her to the Urgent Care to be treated.

## 2014-11-13 ENCOUNTER — Emergency Department (INDEPENDENT_AMBULATORY_CARE_PROVIDER_SITE_OTHER)
Admission: EM | Admit: 2014-11-13 | Discharge: 2014-11-13 | Disposition: A | Discharge status: Home / Self Care | Payer: Medicare Other | Source: Home / Self Care | Attending: Emergency Medicine | Admitting: Emergency Medicine

## 2014-11-13 ENCOUNTER — Encounter (HOSPITAL_COMMUNITY): Payer: Self-pay | Admitting: *Deleted

## 2014-11-13 DIAGNOSIS — Z760 Encounter for issue of repeat prescription: Secondary | ICD-10-CM

## 2014-11-13 DIAGNOSIS — I4891 Unspecified atrial fibrillation: Secondary | ICD-10-CM

## 2014-11-13 MED ORDER — DILTIAZEM HCL ER COATED BEADS 120 MG PO TB24: 120.0000 mg | ORAL_TABLET | Freq: Every day | ORAL | Status: DC

## 2014-11-13 NOTE — ED Notes (Signed)
Pt requesting  A  Refill of  Her  cardizem       She  Has     Has  An appt        With   A  Cardiologist

## 2014-11-13 NOTE — Discharge Instructions (Signed)
Medication Refill, Emergency Department °We have refilled your medication today as a courtesy to you. It is best for your medical care, however, to take care of getting refills done through your primary caregiver's office. They have your records and can do a better job of follow-up than we can in the emergency department. °On maintenance medications, we often only prescribe enough medications to get you by until you are able to see your regular caregiver. This is a more expensive way to refill medications. °In the future, please plan for refills so that you will not have to use the emergency department for this. °Thank you for your help. Your help allows us to better take care of the daily emergencies that enter our department. °Document Released: 02/24/2004 Document Revised: 01/30/2012 Document Reviewed: 02/14/2014 °ExitCare® Patient Information ©2015 [employer]. This information is not intended to replace advice given to you by your health care provider. Make sure you discuss any questions you have with your health care provider. ° °

## 2014-11-13 NOTE — ED Provider Notes (Signed)
CSN: [id number]     Arrival date & time 11/13/14  1155 History   First MD Initiated Contact with Patient 11/13/14 1205     Chief Complaint  Patient presents with  . Medication Refill   (Consider location/radiation/quality/duration/timing/severity/associated sxs/prior Treatment) HPI Comments: Patient arrives with her daughter and was recently diagnosed with atrial fibrillation (while an inpatient at [facility])  and placed on Cardizem for rate control. Is not scheduled to see her cardiologist (Dr. Patty SermonsBrackbill) until Jan. 27, 2016 and ran out of Cardizem 3 days ago. Is here to request medication refill until follow up appointment.  Patient reports herself to be without complaint  The history is provided by the patient and a relative.    Past Medical History  Diagnosis Date  . Hypertension   . Diabetes mellitus without complication   . Arthritis   . Hypothyroid   . Fibromyalgia   . Complication of anesthesia     trouble waking up, very cold, hypotension  . Atrial fibrillation    Past Surgical History  Procedure Laterality Date  . Hip surgery    . Cholecystectomy  1980s?    open chole  . Eye surgery     Family History  Problem Relation Age of Onset  . Stroke Father   . Arthritis Sister   . Arthritis Daughter    History  Substance Use Topics  . Smoking status: Former Smoker -- 1.50 packs/day for 20 years    Types: Cigarettes    Quit date: 11/21/1965  . Smokeless tobacco: Never Used  . Alcohol Use: Yes     Comment: Rarely    OB History    No data available     Review of Systems  All other systems reviewed and are negative.   Allergies  Tetanus toxoids  Home Medications   Prior to Admission medications   Medication Sig Start Date End Date Taking? Authorizing Provider  aspirin 325 MG EC tablet Take 325 mg by mouth daily.    Historical Provider, MD  diclofenac sodium (VOLTAREN) 1 % GEL Apply 4 g topically 4 (four) times daily as needed (for pain).      Historical Provider, MD  diltiazem (CARDIZEM CD) 120 MG 24 hr capsule Take 1 capsule (120 mg total) by mouth daily. 07/28/14   Kathlen ModyVijaya Akula, MD  diltiazem (CARDIZEM LA) 120 MG 24 hr tablet Take 1 tablet (120 mg total) by mouth daily. 11/13/14   Ria ClockJennifer Lee H Presson, PA  DULoxetine (CYMBALTA) 60 MG capsule Take 60 mg by mouth every morning.  12/12/13 12/12/14  Gillian Scarceobyn K Zanard, MD  feeding supplement, RESOURCE BREEZE, (RESOURCE BREEZE) LIQD Take 1 Container by mouth 2 (two) times daily between meals. 10/20/14   Catarina Hartshornavid Tat, MD  furosemide (LASIX) 40 MG tablet Take 40 mg by mouth daily.    Historical Provider, MD  gabapentin (NEURONTIN) 600 MG tablet Take 600 mg by mouth at bedtime.     Historical Provider, MD  HYDROcodone-acetaminophen (NORCO/VICODIN) 5-325 MG per tablet Take one tablet by mouth every 4 hours as needed for pain 08/13/14   Oneal GroutMahima Pandey, MD  levothyroxine (SYNTHROID, LEVOTHROID) 75 MCG tablet Take 75 mcg by mouth daily before breakfast.    Historical Provider, MD  lidocaine (LIDODERM) 5 % Place 1 patch onto the skin daily as needed (for pain). Apply to both knees and right shoulder. Remove & Discard patch within 12 hours or as directed by MD    Historical Provider, MD  metroNIDAZOLE (FLAGYL) 500  MG tablet Take 1 tablet (500 mg total) by mouth every 8 (eight) hours. 10/20/14   Catarina Hartshornavid Tat, MD  Multiple Vitamin (MULTIVITAMIN) tablet Take 1 tablet by mouth daily.    Historical Provider, MD  pantoprazole (PROTONIX) 40 MG tablet Take 1 tablet (40 mg total) by mouth 2 (two) times daily. 07/28/14   Kathlen ModyVijaya Akula, MD  polyethylene glycol (MIRALAX / GLYCOLAX) packet Take 8.5-17 g by mouth daily as needed for moderate constipation.  12/28/12   Joseph ArtJessica U Vann, DO  potassium gluconate (SM POTASSIUM) 595 MG TABS tablet Take 595 mg by mouth daily.    Historical Provider, MD  traMADol (ULTRAM) 50 MG tablet Take 50 mg by mouth every 6 (six) hours.    Historical Provider, MD  Vitamin D, Ergocalciferol, (DRISDOL)  50000 UNITS CAPS capsule Take 50,000 Units by mouth every 7 (seven) days. Saturday. 12/12/13 12/12/14  Gillian Scarceobyn K Zanard, MD   BP 129/78 mmHg  Pulse 125  Temp(Src) 98.7 F (37.1 C) (Oral)  Resp 16  SpO2 92% Physical Exam  Constitutional: She is oriented to person, place, and time. She appears well-developed and well-nourished. No distress.  HENT:  Head: Normocephalic and atraumatic.  Eyes: Conjunctivae are normal.  Cardiovascular: An irregularly irregular rhythm present. Tachycardia present.   Pulmonary/Chest: Effort normal and breath sounds normal.  Musculoskeletal: Normal range of motion.  Neurological: She is alert and oriented to person, place, and time.  Skin: Skin is warm and dry.  Psychiatric: She has a normal mood and affect. Her behavior is normal.  Nursing note and vitals reviewed.   ED Course  Procedures (including critical care time) Labs Review Labs Reviewed - No data to display  Imaging Review No results found.   MDM   1. Encounter for medication refill   Provided Rx for 30day supply with one refill for patient's current dose of Cardizem.    Ria ClockJennifer Lee H Presson, [place] 11/13/14 [phone]

## 2014-11-29 ENCOUNTER — Other Ambulatory Visit: Payer: Self-pay | Admitting: Adult Health

## 2014-12-17 ENCOUNTER — Encounter: Payer: Self-pay | Admitting: Cardiology

## 2014-12-17 ENCOUNTER — Ambulatory Visit (INDEPENDENT_AMBULATORY_CARE_PROVIDER_SITE_OTHER): Payer: Medicare Other | Admitting: Cardiology

## 2014-12-17 VITALS — BP 140/80 | HR 78 | Ht 59.0 in | Wt 138.0 lb

## 2014-12-17 DIAGNOSIS — I482 Chronic atrial fibrillation, unspecified: Secondary | ICD-10-CM

## 2014-12-17 DIAGNOSIS — I10 Essential (primary) hypertension: Secondary | ICD-10-CM

## 2014-12-17 MED ORDER — PANTOPRAZOLE SODIUM 40 MG PO TBEC: 40.0000 mg | DELAYED_RELEASE_TABLET | Freq: Two times a day (BID) | ORAL | Status: DC

## 2014-12-17 MED ORDER — APIXABAN 2.5 MG PO TABS: 2.5000 mg | ORAL_TABLET | Freq: Two times a day (BID) | ORAL | Status: DC

## 2014-12-17 MED ORDER — DILTIAZEM HCL ER COATED BEADS 120 MG PO CP24: 120.0000 mg | ORAL_CAPSULE | Freq: Every day | ORAL | Status: DC

## 2014-12-17 NOTE — Patient Instructions (Signed)
STOP ASPRIN   START ELIQUIS 2.5 MG TWICE A DAY  Your physician recommends that you schedule a follow-up appointment in: 2 MONTH OV/BMET/CBC/EKG

## 2014-12-17 NOTE — Progress Notes (Signed)
Cardiology Office Note   Date:  12/17/2014   ID:  Kelli Pena, DOB February 28, 1920, MRN [id number]  PCP:  No PCP Per Patient  Cardiologist:   Cassell Clementhomas Advait Buice, MD   No chief complaint on file.     History of Present Illness: Kelli Pena is a 81 y.o. female who presents for evaluation of atrial fibrillation.  The duration of her atrial fibrillation is unknown.  She was hospitalized for a broken hip in August 2015 and atrial fibrillation was present during that admission.  Her daughter who is a nurse suspects that she may have had it for a longer period of time.  She does not have any history of TIA or stroke.  She does not have any history of angina pectoris or coronary disease.  He had an echocardiogram on 07/23/14 showing an ejection fraction of 65-70% and mild mitral regurgitation and normal left atrial size.  She has not been on anticoagulation up until this point.  During her hospitalization for her broken hip she had complications of GI bleeding in August 2015.  She did not have any prior history of known GI bleeding and she has had no subsequent GI bleeding.  Following her hospitalization she went to a nursing home and came home from the nursing home in November.  She had 2 reenter the hospital because of C. difficile which responded to Flagyl.  Her bowels have been stable now for the past 4 weeks and she is no longer having any problem with diarrhea. Her Chadds vasc  Score is 4 for high blood pressure, age greater than 12375 and female sex.  She is borderline diabetic as well. Her family history is negative for known atrial fibrillation. 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    Past Medical History  Diagnosis Date  . Hypertension   . Diabetes mellitus without complication   . Arthritis   . Hypothyroid   . Fibromyalgia   . Complication of anesthesia    trouble waking up, very cold, hypotension  . Atrial fibrillation     Past Surgical History  Procedure Laterality Date  . Hip surgery    . Cholecystectomy  1980s?    open chole  . Eye surgery       Current Outpatient Prescriptions  Medication Sig Dispense Refill  . diltiazem (CARDIZEM CD) 120 MG 24 hr capsule Take 1 capsule (120 mg total) by mouth daily. 30 capsule 5  . gabapentin (NEURONTIN) 600 MG tablet Take 600 mg by mouth at bedtime.     Marland Kitchen. HYDROcodone-acetaminophen (NORCO/VICODIN) 5-325 MG per tablet Take one tablet by mouth every 4 hours as needed for pain 180 tablet 0  . levothyroxine (SYNTHROID, LEVOTHROID) 75 MCG tablet Take 75 mcg by mouth daily before breakfast.    . lidocaine (LIDODERM) 5 % Place 1 patch onto the skin daily as needed (for pain). Apply to both knees and right shoulder. Remove & Discard patch within 12 hours or as directed by MD    . Multiple Vitamin (MULTIVITAMIN) tablet Take 1 tablet by mouth daily.    . pantoprazole (PROTONIX) 40 MG tablet Take 1 tablet (40 mg total) by mouth 2 (two) times daily. 60 tablet 5  . polyethylene glycol (MIRALAX / GLYCOLAX) packet Take 8.5-17 g by mouth daily as needed for moderate constipation.     . potassium gluconate (SM  POTASSIUM) 595 MG TABS tablet Take 595 mg by mouth daily.    . traMADol (ULTRAM) 50 MG tablet Take 50 mg by mouth every 6 (six) hours.    Marland Kitchen apixaban (ELIQUIS) 2.5 MG TABS tablet Take 1 tablet (2.5 mg total) by mouth 2 (two) times daily. 60 tablet 5  . DULoxetine (CYMBALTA) 60 MG capsule Take 60 mg by mouth every morning.      No current facility-administered medications for this visit.    Allergies:   Tetanus toxoids    Social History:  The patient  reports that she quit smoking about 49 years ago. Her smoking use included Cigarettes. She has a 30 pack-year smoking history. She has never used smokeless tobacco. She reports that she drinks alcohol. She reports that she does not use illicit drugs.   Family  History:  The patient's family history includes Arthritis in her daughter and sister; Stroke in her father.    ROS:  Please see the history of present illness.   Otherwise, review of systems are positive for patient is hard of hearing.   All other systems are reviewed and negative.    PHYSICAL EXAM: VS:  BP 140/80 mmHg  Pulse 78  Ht  (1.499 m)  Wt 138 lb (62.596 kg)  BMI 27.86 kg/m2  SpO2 96% , BMI Body mass index is 27.86 kg/(m^2). GEN: Well nourished, well developed, in no acute distress HEENT: normalWith decreased hearing. Neck: no JVD, carotid bruits, or masses Cardiac: Irregularly irregular rhythm.  no murmurs, rubs, or gallops,no edema  Respiratory:  clear to auscultation bilaterally, normal work of breathing GI: soft, nontender, nondistended, + BS MS: no deformity or atrophy Skin: warm and dry, no rash Neuro:  Strength and sensation are intact Psych: euthymic mood, full affect   EKG:  EKG is ordered today. The ekg ordered today demonstrates atrial fibrillation with controlled ventricular response   Recent Labs: 10/16/2014: ALT 8; Magnesium 1.8; TSH 1.510 10/19/2014: Hemoglobin 11.7*; Platelets 451* 10/20/2014: BUN 3*; Creatinine 0.51; Potassium 4.1; Sodium 129*    Lipid Panel No results found for: CHOL, TRIG, HDL, CHOLHDL, VLDL, LDLCALC, LDLDIRECT    Wt Readings from Last 3 Encounters:  12/17/14 138 lb (62.596 kg)  10/19/14 161 lb 9.6 oz (73.3 kg)  10/01/14 147 lb 9.6 oz (66.951 kg)      Other studies Reviewed: Additional studies/ records that were reviewed today include: Chest x-ray from 10/16/14 Review of the above records demonstrates: Aortic arch plaque, normal heart size   ASSESSMENT AND PLAN: 1.  Chronic atrial fibrillation with controlled ventricular response on diltiazem.  Her Chaddsvasc score is at least 4 and is as high as 6 if we count borderline diabetes and aortic arch plaque. 2.  History of essential hypertension 3.  History of  borderline diabetes 4.  Remote GI bleeding in August 2015, no recurrence   Current medicines are reviewed at length with the patient today.  The patient does not have concerns regarding medicines.  The following changes have been made:  Stop aspirin and start Eliquis 2.5 mg twice a day.  We will use the lower dose because of her age 60 and her weight is very close to 60 kg.  Labs/ tests ordered today include: EKG   Orders Placed This Encounter  Procedures  . Basic metabolic panel  . CBC with Differential/Platelet  . EKG 12-Lead     Disposition:   FU with Dr. Patty Sermons in 2 months for office visit EKG CBC and  basal metabolic panel.   Signed, Cassell Clement, MD  12/17/2014 6:00 PM    [facility] [address] [place], [place], [place]  [address] Phone: [phone]; Fax: [phone]

## 2015-02-26 ENCOUNTER — Other Ambulatory Visit: Payer: Medicare Other

## 2015-02-26 ENCOUNTER — Ambulatory Visit: Payer: Medicare Other | Admitting: Cardiology

## 2015-03-10 ENCOUNTER — Ambulatory Visit (INDEPENDENT_AMBULATORY_CARE_PROVIDER_SITE_OTHER): Payer: Medicare Other | Admitting: Cardiology

## 2015-03-10 ENCOUNTER — Other Ambulatory Visit: Payer: Medicare Other

## 2015-03-10 ENCOUNTER — Encounter: Payer: Self-pay | Admitting: Cardiology

## 2015-03-10 VITALS — BP 140/64 | HR 79 | Ht 59.0 in | Wt 128.8 lb

## 2015-03-10 DIAGNOSIS — S82202A Unspecified fracture of shaft of left tibia, initial encounter for closed fracture: Secondary | ICD-10-CM

## 2015-03-10 DIAGNOSIS — I10 Essential (primary) hypertension: Secondary | ICD-10-CM | POA: Diagnosis not present

## 2015-03-10 DIAGNOSIS — I482 Chronic atrial fibrillation, unspecified: Secondary | ICD-10-CM

## 2015-03-10 NOTE — Addendum Note (Signed)
Addended by: Tonita PhoenixBOWDEN, Bentley Fissel K on: 03/10/2015 03:06 PM   Modules accepted: Orders

## 2015-03-10 NOTE — Progress Notes (Signed)
Cardiology Office Note   Date:  03/10/2015   ID:  Kelli Pena, DOB Apr 15, 1920, MRN [id number]  PCP:  No PCP Per Patient  Cardiologist:   Cassell Clement, MD   No chief complaint on file.     History of Present Illness: Kelli Pena is a 81 y.o. female who presents for a six-month follow-up office visit  Kelli Pena is a 81 y.o. female who presented 6 months ago for evaluation of atrial fibrillation. The duration of her atrial fibrillation is unknown. She was hospitalized for a broken hip in August 2015 and atrial fibrillation was present during that admission. Her daughter who is a nurse suspects that she may have had it for a longer period of time. She does not have any history of TIA or stroke. She does not have any history of angina pectoris or coronary disease. He had an echocardiogram on 07/23/14 showing an ejection fraction of 65-70% and mild mitral regurgitation and normal left atrial size. She has not been on anticoagulation up until this point. During her hospitalization for her broken hip she had complications of GI bleeding in August 2015. She did not have any prior history of known GI bleeding and she has had no subsequent GI bleeding. Following her hospitalization she went to a nursing home and came home from the nursing home in November. She had 2 reenter the hospital because of C. difficile which responded to Flagyl. Her bowels have been stable now for the past 4 weeks and she is no longer having any problem with diarrhea. Her Chadds vasc Score is 4 for high blood pressure, age greater than 76 and female sex. She is borderline diabetic as well. At her last visit she was placed on Eliquis 2.5 mg twice a day.  She has tolerated the Eliquis without any side effects.  She has not had any GI bleeding.  She has had no TIA or stroke symptoms.  No peripheral edema. Her family history is negative for known atrial fibrillation. She has a history of being on  gabapentin for an unusual central neuropathy which causes her skin to feel like something is "creeping and crawling over her skin".  Past Medical History  Diagnosis Date  . Hypertension   . Diabetes mellitus without complication   . Arthritis   . Hypothyroid   . Fibromyalgia   . Complication of anesthesia     trouble waking up, very cold, hypotension  . Atrial fibrillation     Past Surgical History  Procedure Laterality Date  . Hip surgery    . Cholecystectomy  1980s?    open chole  . Eye surgery       Current Outpatient Prescriptions  Medication Sig Dispense Refill  . apixaban (ELIQUIS) 2.5 MG TABS tablet Take 1 tablet (2.5 mg total) by mouth 2 (two) times daily. 60 tablet 5  . diltiazem (CARDIZEM CD) 120 MG 24 hr capsule Take 1 capsule (120 mg total) by mouth daily. 30 capsule 5  . gabapentin (NEURONTIN) 600 MG tablet Take 600 mg by mouth at bedtime.     Marland Kitchen HYDROcodone-acetaminophen (NORCO/VICODIN) 5-325 MG per tablet Take one tablet by mouth every 4 hours as needed for pain 180 tablet 0  . levothyroxine (SYNTHROID, LEVOTHROID) 75 MCG tablet Take 75 mcg by mouth daily before breakfast.    . lidocaine (LIDODERM) 5 % Place 1 patch onto the skin daily as needed (for pain). Apply to both knees and right shoulder. Remove &  Discard patch within 12 hours or as directed by MD    . Multiple Vitamin (MULTIVITAMIN) tablet Take 1 tablet by mouth daily.    . pantoprazole (PROTONIX) 40 MG tablet Take 1 tablet (40 mg total) by mouth 2 (two) times daily. 60 tablet 5  . polyethylene glycol (MIRALAX / GLYCOLAX) packet Take 8.5-17 g by mouth daily as needed for moderate constipation.     . potassium gluconate (SM POTASSIUM) 595 MG TABS tablet Take 595 mg by mouth daily.    . traMADol (ULTRAM) 50 MG tablet Take 50 mg by mouth every 6 (six) hours.    . DULoxetine (CYMBALTA) 60 MG capsule Take 60 mg by mouth every morning.      No current facility-administered medications for this visit.     Allergies:   Tetanus toxoids    Social History:  The patient  reports that she quit smoking about 49 years ago. Her smoking use included Cigarettes. She has a 30 pack-year smoking history. She has never used smokeless tobacco. She reports that she drinks alcohol. She reports that she does not use illicit drugs.   Family History:  The patient's family history includes Arthritis in her daughter and sister; Stroke in her father.    ROS:  Please see the history of present illness.   Otherwise, review of systems are positive for none.   All other systems are reviewed and negative.    PHYSICAL EXAM: VS:  BP 140/64 mmHg  Pulse 79  Ht  (1.499 m)  Wt 128 lb 12.8 oz (58.423 kg)  BMI 26.00 kg/m2 , BMI Body mass index is 26 kg/(m^2). GEN: Well nourished, well developed, in no acute distress HEENT: normal Neck: no JVD, carotid bruits, or masses Cardiac: Irregularly irregular. no murmurs, rubs, or gallops,no edema  Respiratory:  clear to auscultation bilaterally, normal work of breathing GI: soft, nontender, nondistended, + BS MS: no deformity or atrophy Skin: warm and dry, no rash Neuro:  Strength and sensation are intact Psych: euthymic mood, full affect   EKG:  EKG is ordered today. The ekg ordered today demonstrates atrial fibrillation with controlled ventricular response.  Low voltage.   Recent Labs: 10/16/2014: ALT 8; Magnesium 1.8; TSH 1.510 10/19/2014: Hemoglobin 11.7*; Platelets 451* 10/20/2014: BUN 3*; Creatinine 0.51; Potassium 4.1; Sodium 129*    Lipid Panel No results found for: CHOL, TRIG, HDL, CHOLHDL, VLDL, LDLCALC, LDLDIRECT    Wt Readings from Last 3 Encounters:  03/10/15 128 lb 12.8 oz (58.423 kg)  12/17/14 138 lb (62.596 kg)  10/19/14 161 lb 9.6 oz (73.3 kg)         ASSESSMENT AND PLAN:  1. Chronic atrial fibrillation with controlled ventricular response on diltiazem. Her Chaddsvasc score is at least 4 and is as high as 6 if we count  borderline diabetes and aortic arch plaque. 2. History of essential hypertension 3. History of borderline diabetes 4. Remote GI bleeding in August 2015, no recurrence   Current medicines are reviewed at length with the patient today. The patient does not have concerns regarding medicines.  Continue Eliquis 2.5 mg twice a day. We will use the lower dose because of her age 44 and her weight is very close to 60 kg.   Current medicines are reviewed at length with the patient today.  The patient does not have concerns regarding medicines.  The following changes have been made:  no change  Labs/ tests ordered today include:  No orders of the defined types were  placed in this encounter.    Disposition: Continue current medication.  Recheck in 6 months for office visit EKG CBC and basal metabolic panel.   Signed, Cassell Clementhomas Tyton Abdallah, MD  03/10/2015 5:11 PM    [facility] [address], [place], [place]  [address] Phone: [phone]; Fax: [phone]

## 2015-03-10 NOTE — Patient Instructions (Signed)
Medication Instructions:  Your physician recommends that you continue on your current medications as directed. Please refer to the Current Medication list given to you today.  Labwork: none  Testing/Procedures: none  Follow-Up: Your physician wants you to follow-up in: 6 month ov/ekg/cbc/bmet You will receive a reminder letter in the mail two months in advance. If you don't receive a letter, please call our office to schedule the follow-up appointment.

## 2015-05-16 IMAGING — CR DG UGI W/ GASTROGRAFIN
1 series · 1 of 1 positions shown · IV contrast (omnipaque)
Comparison: 07/20/2014

FLUOROSCOPY TIME:  1 min 22 seconds

CLINICAL DATA: Evaluate for perforation

EXAM:
WATER SOLUBLE UPPER GI SERIES
TECHNIQUE: Single-column upper GI series was performed using water soluble
contrast.
CONTRAST:  75mL OMNIPAQUE IOHEXOL 300 MG/ML  SOLN

[view not recorded]
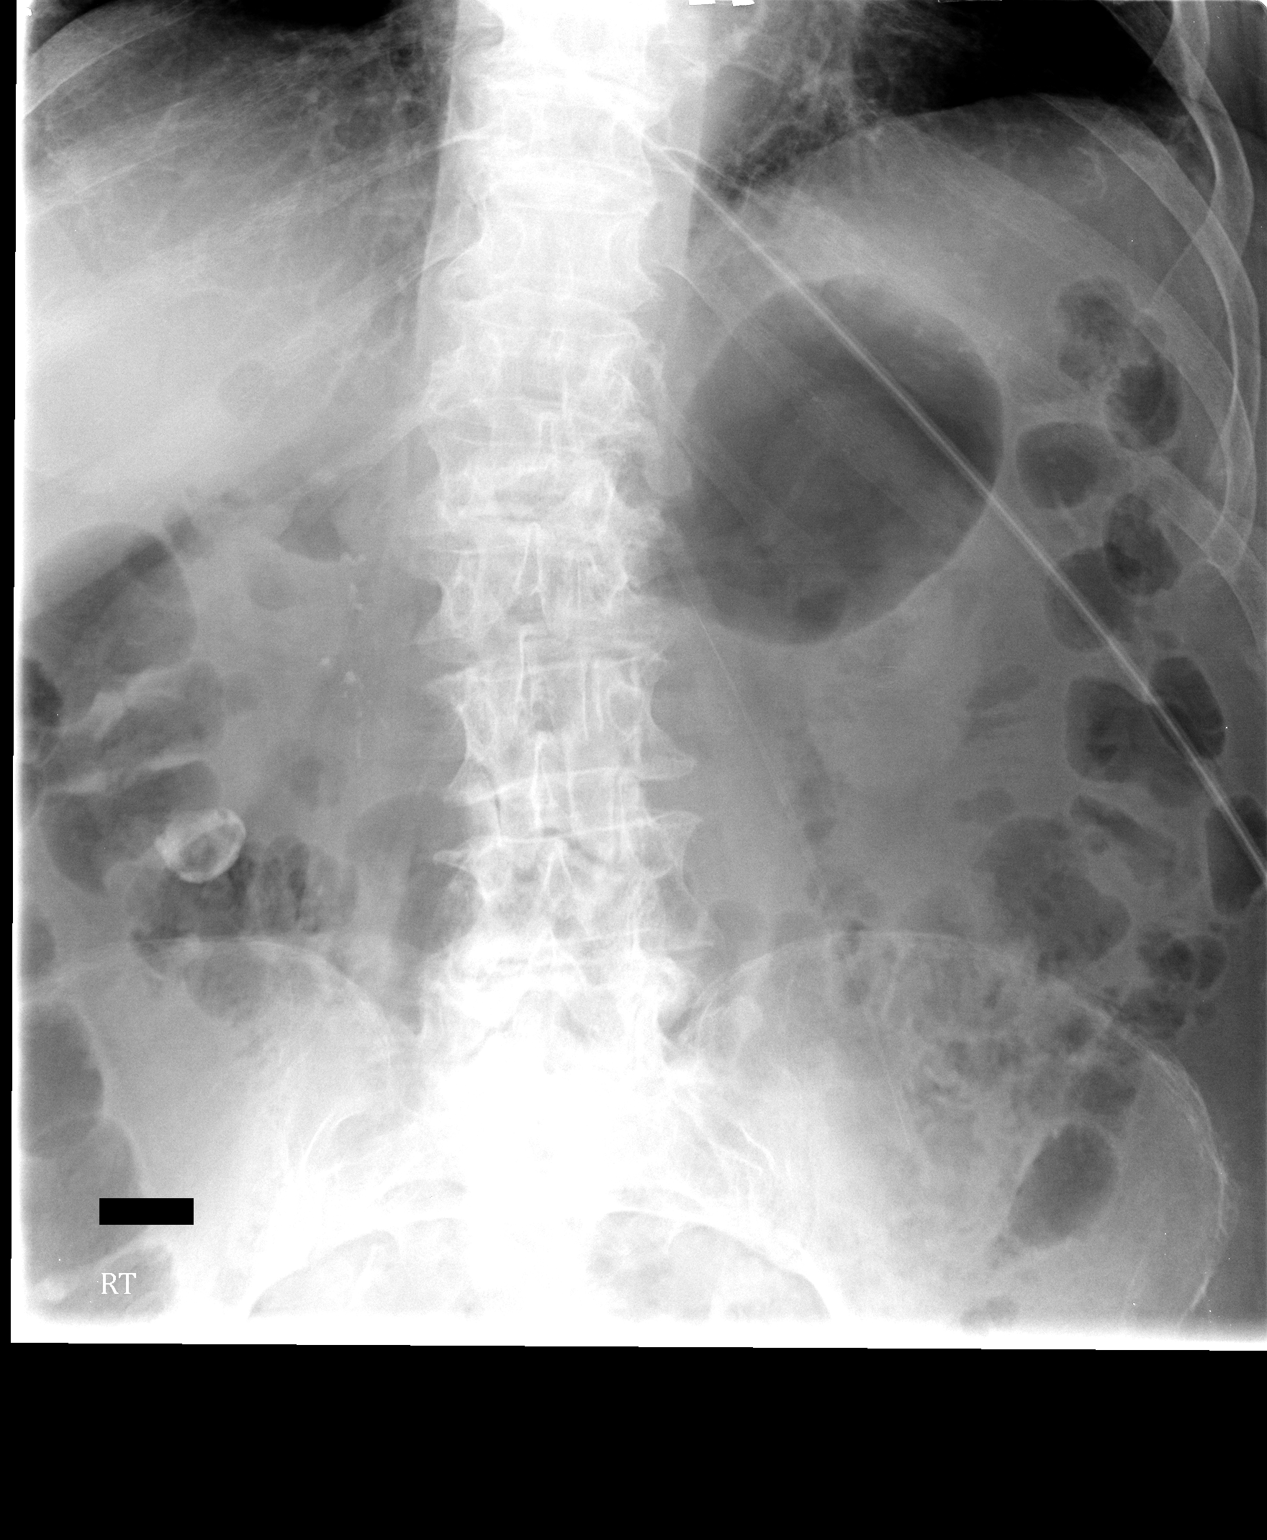

[1 of 1 positions shown; findings below may reference images not displayed]

FINDINGS: The patient ingested water-soluble contrast material. This passed
through the distal esophagus and opacifies the stomach, duodenum and
proximal small bowel loops. Small collection of contrast material
along the D2 segment of the duodenum is favored to represent a large
duodenal diverticulum. No extravasation of contrast material
identified.
IMPRESSION: 1. No evidence for active extravasation of contrast material.
2. Contrast collection along the descending portion of the duodenum
is favored to represent a duodenal diverticulum. This is similar to
07/20/2014.

## 2015-05-17 IMAGING — CT CT HEAD W/O CM
1 series · 16 of 30 positions shown, 20 images · non-contrast
Comparison: None.

CLINICAL DATA: Possible TIA

EXAM:
CT HEAD WITHOUT CONTRAST
TECHNIQUE: Contiguous axial images were obtained from the base of the skull
through the vertex without intravenous contrast.

[Series 2: head 5.0 h30s · axial · 0.46mm/px · z∈[+1461,+1596]mm · 16 of 31 slices shown, 20 images]
[im 2/31  brain]
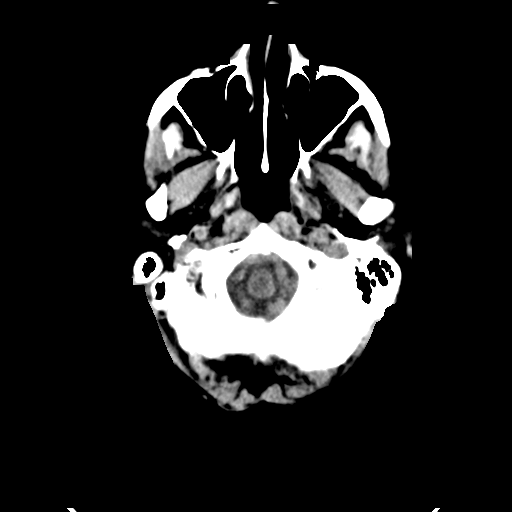
[im 2/31  bone]
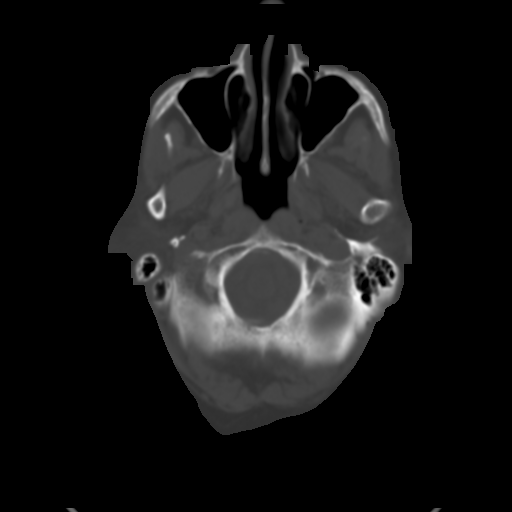
[im 4/31  brain]
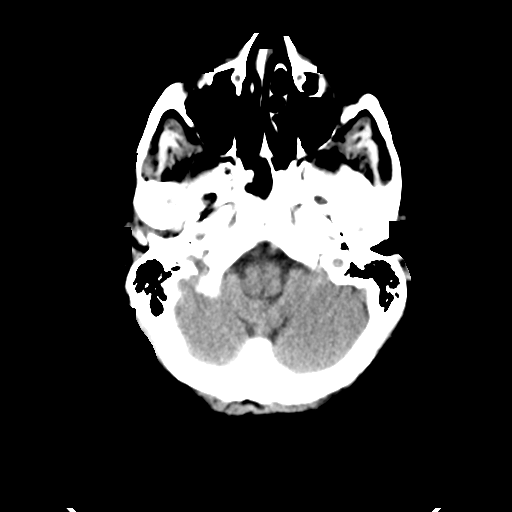
[im 6/31  brain]
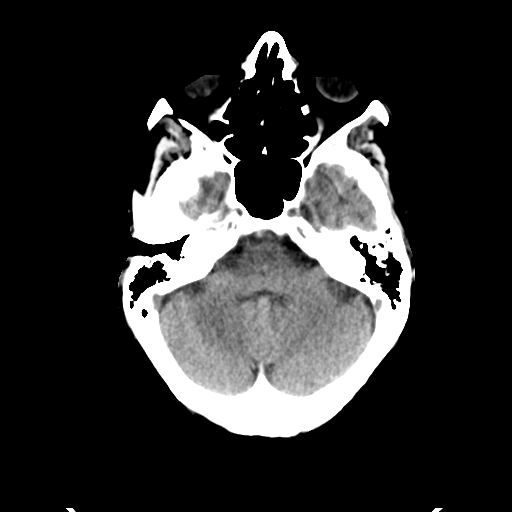
[im 8/31  brain]
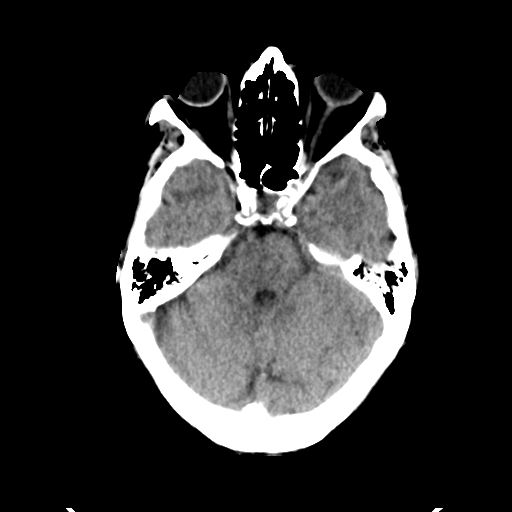
[im 9/31  brain]
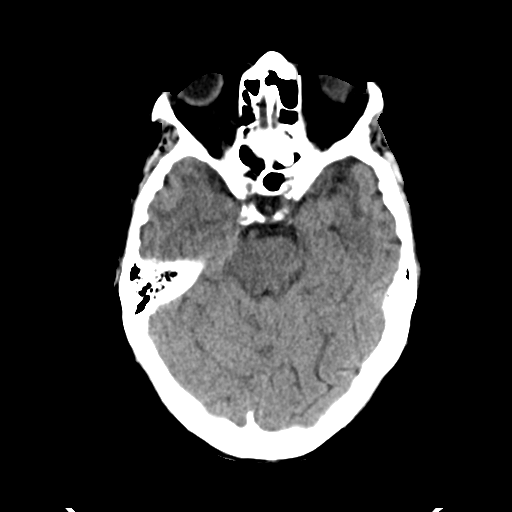
[im 9/31  bone]
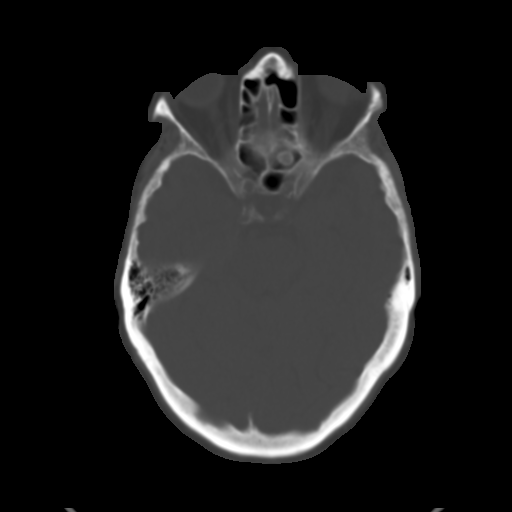
[im 11/31  brain]
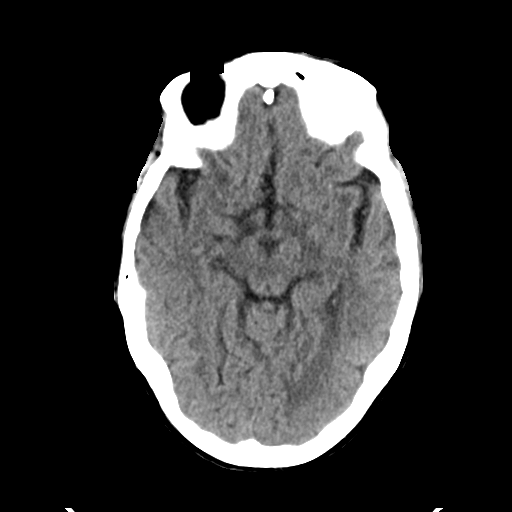
[im 13/31  brain]
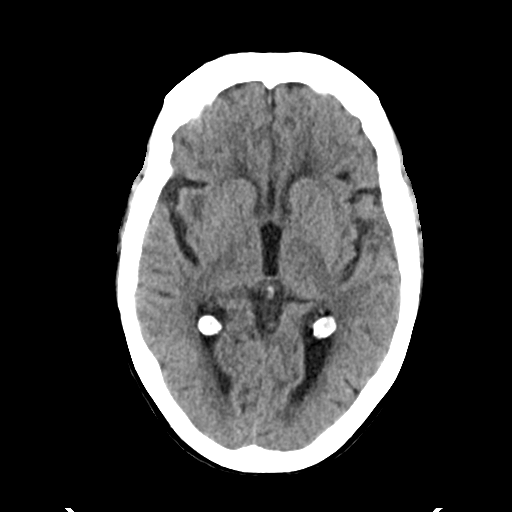
[im 15/31  brain]
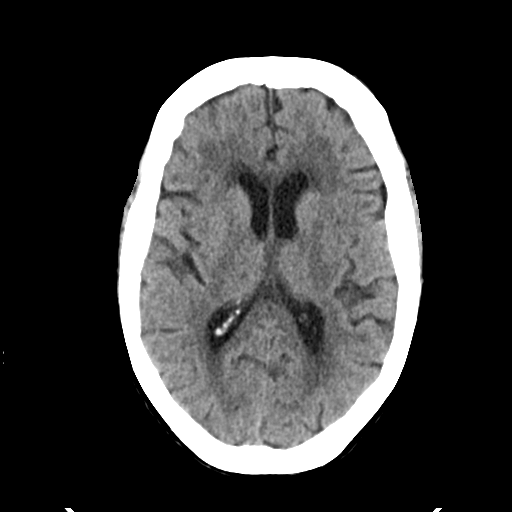
[im 16/31  brain]
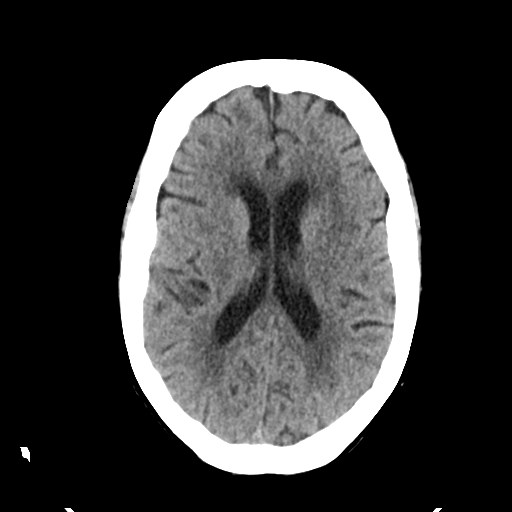
[im 16/31  bone]
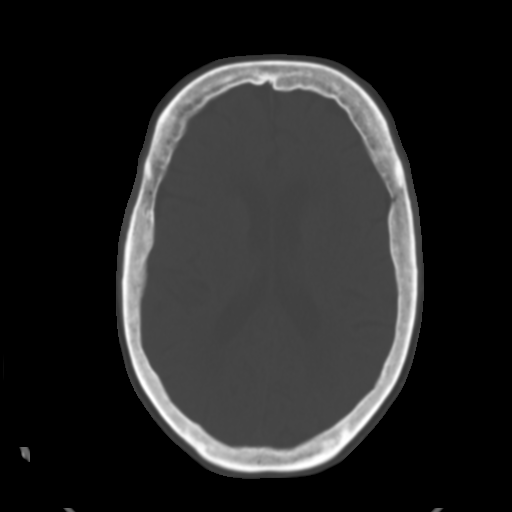
[im 18/31  brain]
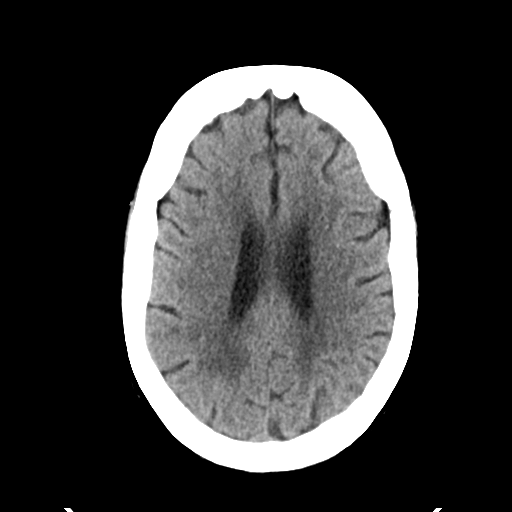
[im 20/31  brain]
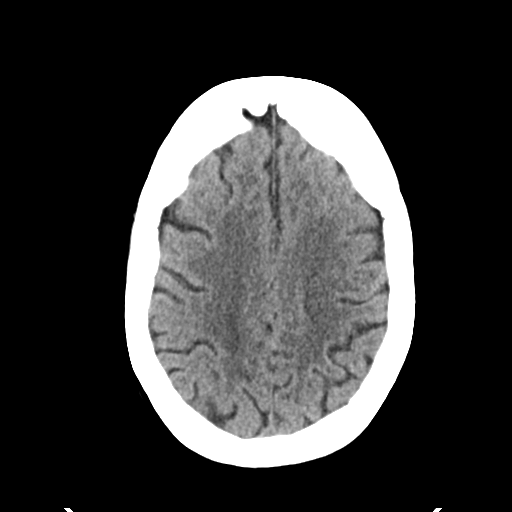
[im 22/31  brain]
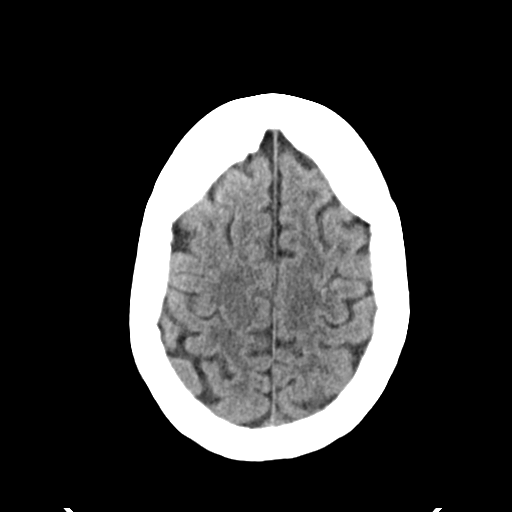
[im 23/31  brain]
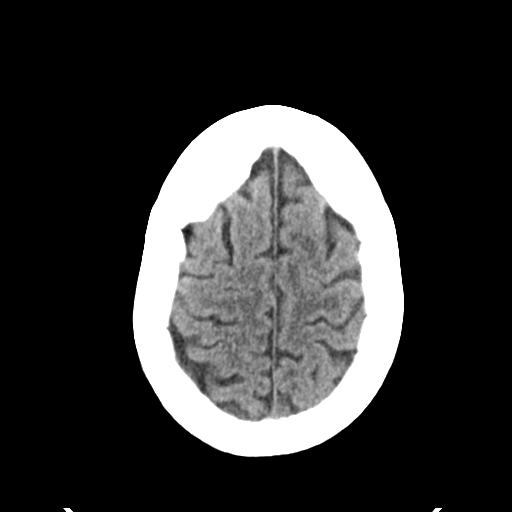
[im 23/31  bone]
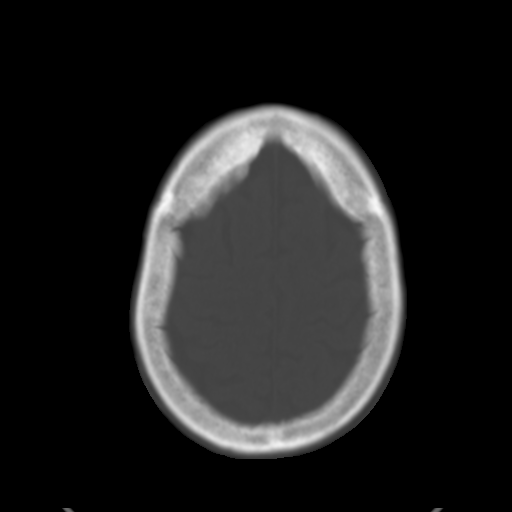
[im 25/31  brain]
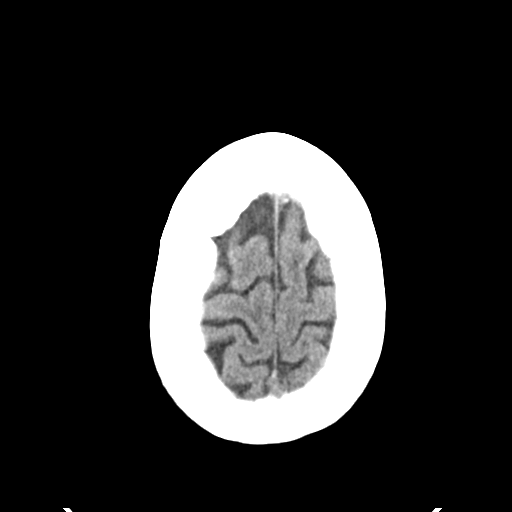
[im 27/31  brain]
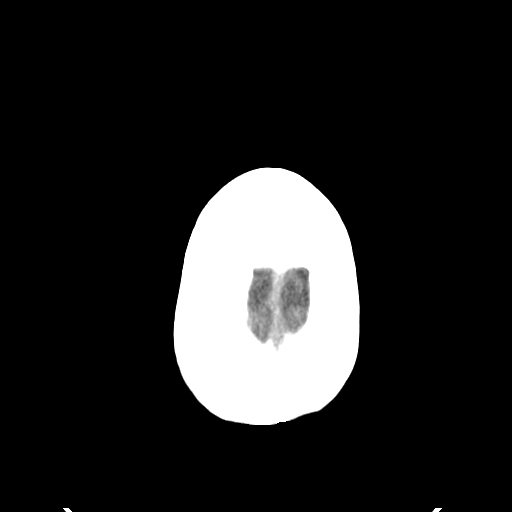
[im 29/31  brain]
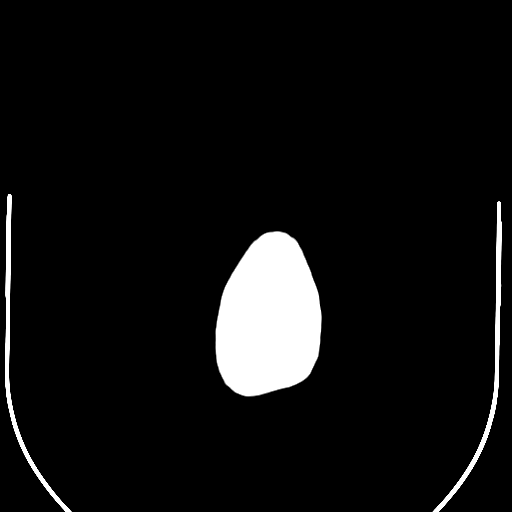

[16 of 30 positions shown; findings below may reference images not displayed]

FINDINGS: Bony calvarium is intact. No findings to suggest acute hemorrhage,
acute infarction or space-occupying mass lesion are noted.
IMPRESSION: No acute intracranial abnormality seen.

## 2015-07-11 ENCOUNTER — Other Ambulatory Visit: Payer: Self-pay | Admitting: Cardiology

## 2015-09-14 ENCOUNTER — Telehealth: Payer: Self-pay | Admitting: Cardiology

## 2015-09-14 MED ORDER — DILTIAZEM HCL ER COATED BEADS 120 MG PO CP24: ORAL_CAPSULE | ORAL | Status: DC

## 2015-09-14 NOTE — Telephone Encounter (Signed)
  STAT if patient is at the pharmacy , call can be transferred to refill team.   1. Which medications need to be refilled? cardizem- 120 mg  2. Which pharmacy/location is medication to be sent to? [facility]  3. Do they need a 30 day or 90 day supply? 90

## 2015-10-06 ENCOUNTER — Encounter: Payer: Self-pay | Admitting: Cardiology

## 2015-10-26 ENCOUNTER — Ambulatory Visit: Payer: Medicare Other | Admitting: Cardiology

## 2015-11-02 ENCOUNTER — Encounter: Payer: Self-pay | Admitting: Cardiology

## 2015-11-02 ENCOUNTER — Ambulatory Visit (INDEPENDENT_AMBULATORY_CARE_PROVIDER_SITE_OTHER): Payer: Medicare Other | Admitting: Cardiology

## 2015-11-02 VITALS — BP 104/54 | HR 69 | Ht 59.0 in | Wt 127.8 lb

## 2015-11-02 DIAGNOSIS — I10 Essential (primary) hypertension: Secondary | ICD-10-CM | POA: Diagnosis not present

## 2015-11-02 DIAGNOSIS — I482 Chronic atrial fibrillation, unspecified: Secondary | ICD-10-CM

## 2015-11-02 MED ORDER — HYDROCHLOROTHIAZIDE 25 MG PO TABS: 25.0000 mg | ORAL_TABLET | Freq: Every day | ORAL | Status: AC

## 2015-11-02 MED ORDER — APIXABAN 2.5 MG PO TABS: 2.5000 mg | ORAL_TABLET | Freq: Two times a day (BID) | ORAL | Status: DC

## 2015-11-02 MED ORDER — DILTIAZEM HCL ER COATED BEADS 120 MG PO CP24: ORAL_CAPSULE | ORAL | Status: DC

## 2015-11-02 NOTE — Progress Notes (Signed)
Cardiology Office Note   Date:  11/02/2015   ID:  Kelli Pena, DOB [birth year], MRN [id number]  PCP:  No PCP Per Patient  Cardiologist: Cassell Clement MD  Chief Complaint  Patient presents with  . Atrial Fibrillation      History of Present Illness: Kelli Pena is a 81 y.o. female who presents for follow-up of her atrial fibrillation.  Kelli Pena is a 81 y.o. female who presented 12 months ago for evaluation of atrial fibrillation. The duration of her atrial fibrillation is unknown. She was hospitalized for a broken hip in August 2015 and atrial fibrillation was present during that admission. Her daughter who is a nurse suspects that she may have had it for a longer period of time. She does not have any history of TIA or stroke. She does not have any history of angina pectoris or coronary disease. He had an echocardiogram on 07/23/14 showing an ejection fraction of 65-70% and mild mitral regurgitation and normal left atrial size. She has not been on anticoagulation up until this point. During her hospitalization for her broken hip she had complications of GI bleeding in August 2015. She did not have any prior history of known GI bleeding and she has had no subsequent GI bleeding. Following her hospitalization she went to a nursing home and came home from the nursing home in November. She had 2 reenter the hospital because of C. difficile which responded to Flagyl. Her bowels have been stable now for the past 4 weeks and she is no longer having any problem with diarrhea. Her Chadds vasc Score is 4 for high blood pressure, age greater than 30 and female sex. She is borderline diabetic as well. At her last visit she was placed on Eliquis 2.5 mg twice a day. She has tolerated the Eliquis without any side effects. She has not had any GI bleeding. She has had no TIA or stroke symptoms. No peripheral edema. Her family history is negative for known atrial  fibrillation. 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 Since we last saw her, blood pressure had run up and she was placed on hydrochlorothiazide 25 mg daily.  Her blood pressure is now low normal and the hydrochlorothiazide is being reduced to just 12.5 mg daily.  Past Medical History  Diagnosis Date  . Hypertension   . Diabetes mellitus without complication (HCC)   . Arthritis   . Hypothyroid   . Fibromyalgia   . Complication of anesthesia     trouble waking up, very cold, hypotension  . Atrial fibrillation [facility])     Past Surgical History  Procedure Laterality Date  . Hip surgery    . Cholecystectomy  1980s?    open chole  . Eye surgery       Current Outpatient Prescriptions  Medication Sig Dispense Refill  . apixaban (ELIQUIS) 2.5 MG TABS tablet Take 1 tablet (2.5 mg total) by mouth 2 (two) times daily. 180 tablet 3  . diltiazem (CARDIZEM CD) 120 MG 24 hr capsule TAKE 1 CAPSULE (120 MG TOTAL) BY MOUTH DAILY. 90 capsule 3  . gabapentin (NEURONTIN) 600 MG tablet Take 600 mg by mouth at bedtime.     . hydrochlorothiazide (HYDRODIURIL) 25 MG tablet Take 1 tablet (25 mg total) by mouth daily. 90 tablet 0  . HYDROcodone-acetaminophen (NORCO/VICODIN) 5-325 MG per tablet Take  one tablet by mouth every 4 hours as needed for pain 180 tablet 0  . ibuprofen (ADVIL,MOTRIN) 200 MG tablet Take 200 mg by mouth every 8 (eight) hours as needed for moderate pain.    Marland Kitchen. levothyroxine (SYNTHROID, LEVOTHROID) 75 MCG tablet Take 75 mcg by mouth daily before breakfast.    . lidocaine (LIDODERM) 5 % Place 1 patch onto the skin daily as needed (for pain). Apply to both knees and right shoulder. Remove & Discard patch within 12 hours or as directed by MD    . Multiple Vitamin (MULTIVITAMIN) tablet Take 1 tablet by mouth daily.    Marland Kitchen. nystatin-triamcinolone ointment (MYCOLOG) Apply 1  application topically 3 (three) times a week. Corners of mouth    . polyethylene glycol (MIRALAX / GLYCOLAX) packet Take 8.5-17 g by mouth daily as needed for moderate constipation.     . potassium gluconate (SM POTASSIUM) 595 MG TABS tablet Take 595 mg by mouth daily.    . traMADol (ULTRAM) 50 MG tablet Take 50 mg by mouth every 6 (six) hours.    . DULoxetine (CYMBALTA) 60 MG capsule Take 60 mg by mouth every morning.      No current facility-administered medications for this visit.    Allergies:   Tetanus toxoid, adsorbed and Tetanus toxoids    Social History:  The patient  reports that she quit smoking about 49 years ago. Her smoking use included Cigarettes. She has a 30 pack-year smoking history. She has never used smokeless tobacco. She reports that she drinks alcohol. She reports that she does not use illicit drugs.   Family History:  The patient's family history includes Arthritis in her daughter and sister; Stroke in her father.    ROS:  Please see the history of present illness.   Otherwise, review of systems are positive for none.   All other systems are reviewed and negative.    PHYSICAL EXAM: VS:  BP 104/54 mmHg  Pulse 69  Ht 4\' 11"  (1.499 m)  Wt 127 lb 12.8 oz (57.97 kg)  BMI 25.80 kg/m2 , BMI Body mass index is 25.8 kg/(m^2). GEN: Well nourished, well developed, in no acute distress HEENT: normal Neck: no JVD, carotid bruits, or masses Cardiac: Irregularly irregular; no murmurs, rubs, or gallops,no edema  Respiratory:  clear to auscultation bilaterally, normal work of breathing GI: soft, nontender, nondistended, + BS MS: no deformity or atrophy.  She wears braces on both legs.  She is able to walk with a walker at home.  Here she is in a wheelchair Skin: warm and dry, no rash Neuro:  Strength and sensation are intact Psych: euthymic mood, full affect   EKG:  EKG is ordered today. The ekg ordered today demonstrates atrial fibrillation with controlled ventricular  response   Recent Labs: No results found for requested labs within last 365 days.    Lipid Panel No results found for: CHOL, TRIG, HDL, CHOLHDL, VLDL, LDLCALC, LDLDIRECT    Wt Readings from Last 3 Encounters:  11/02/15 127 lb 12.8 oz (57.97 kg)  03/10/15 128 lb 12.8 oz (58.423 kg)  12/17/14 138 lb (62.596 kg)         ASSESSMENT AND PLAN:  1. Chronic atrial fibrillation with controlled ventricular response on diltiazem. Her Chaddsvasc score is at least 4 and is as high as 6 if we count borderline diabetes and aortic arch plaque. 2. History of essential hypertension 3. History of borderline diabetes 4. Remote GI bleeding in August 2015, no recurrence  Current medicines are reviewed at length with the patient today. The patient does not have concerns regarding medicines.  Continue Eliquis 2.5 mg twice a day. We will use the lower dose because of her age 26 and her weight is very close to 60 kg.   Current medicines are reviewed at length with the patient today.  The patient does not have concerns regarding medicines.  The following changes have been made:  no change  Labs/ tests ordered today include:   Orders Placed This Encounter  Procedures  . EKG 12-Lead     Disposition: Continue current medication except reduce hydrochlorothiazide to 12.5 mg daily until she can see her PCP again.  Recheck here in 6 months for office visit and EKG with Dr. Mayford Knife  Signed, Cassell Clement MD 11/02/2015 6:16 PM    [facility] [address] [place], [place], [place]  [address] Phone: [phone]; Fax: [phone]

## 2015-11-02 NOTE — Patient Instructions (Signed)
Medication Instructions:  Your physician recommends that you continue on your current medications as directed. Please refer to the Current Medication list given to you today.  Labwork: ndone  Testing/Procedures: none  Follow-Up: Your physician wants you to follow-up in: 6 month ov/ekg/ with Dr Sherlyn Lickurner  You will receive a reminder letter in the mail two months in advance. If you don't receive a letter, please call our office to schedule the follow-up appointment.  If you need a refill on your cardiac medications before your next appointment, please call your pharmacy.

## 2015-12-09 ENCOUNTER — Other Ambulatory Visit: Payer: Self-pay | Admitting: Cardiology

## 2016-06-20 ENCOUNTER — Ambulatory Visit: Payer: Medicare Other | Admitting: Internal Medicine

## 2016-07-07 NOTE — Progress Notes (Signed)
ELECTROPHYSIOLOGY CONSULT NOTE  Patient ID: Kelli CruelJean F Smedley, MRN: [id number], DOB/AGE: Jan 10, 1920 82 y.o. Admit date: (Not on file) Date of Consult: 07/08/2016  Primary Physician: Birdena JubileeZANARD, ROBYN, MD Primary Cardiologist: new -fformer TB Consulting Physician N/A  Chief Complaint: Atrial fib to establish   HPI Kelli Pena is a 82 y.o. female  seen to establish care for atrial fibrillation. It was first noted in 2015. Because of unknown duration no cardioversion was pursued. She is currently on apixoban for a CHADS-VASc score of 4 (age-15 gender-1 blood pressure-1) she has had a remote history of problems with GI bleeding occurring in the context of a hip fracture.  She initially presented to years ago with heart failure and atrial fibrillation. She's been managed diltiazem and apixoban. She's had no further problems with edema of the shortness of breath. There've been no bleeding complications.   Echocardiogram 2015 demonstrated normal LV function normal LA size     Past Medical History:  Diagnosis Date  . Arthritis   . Atrial fibrillation (HCC)   . Complication of anesthesia    trouble waking up, very cold, hypotension  . Diabetes mellitus without complication (HCC)   . Fibromyalgia   . Hypertension   . Hypothyroid       Surgical History:  Past Surgical History:  Procedure Laterality Date  . CHOLECYSTECTOMY  1980s?   open chole  . EYE SURGERY    . HIP SURGERY       Home Meds: Prior to Admission medications   Medication Sig Start Date End Date Taking? Authorizing Provider  apixaban (ELIQUIS) 2.5 MG TABS tablet Take 1 tablet (2.5 mg total) by mouth 2 (two) times daily. 11/02/15   Cassell Clementhomas Brackbill, MD  diltiazem (CARDIZEM CD) 120 MG 24 hr capsule TAKE 1 CAPSULE (120 MG TOTAL) BY MOUTH DAILY. 11/02/15   Cassell Clementhomas Brackbill, MD  DULoxetine (CYMBALTA) 60 MG capsule Take 60 mg by mouth every morning.  12/12/13 12/12/14  Gillian Scarceobyn K Zanard, MD  gabapentin (NEURONTIN) 600 MG  tablet Take 600 mg by mouth at bedtime.     Historical Provider, MD  hydrochlorothiazide (HYDRODIURIL) 25 MG tablet Take 1 tablet (25 mg total) by mouth daily. 11/02/15   Cassell Clementhomas Brackbill, MD  HYDROcodone-acetaminophen (NORCO/VICODIN) 5-325 MG per tablet Take one tablet by mouth every 4 hours as needed for pain 08/13/14   Oneal GroutMahima Pandey, MD  ibuprofen (ADVIL,MOTRIN) 200 MG tablet Take 200 mg by mouth every 8 (eight) hours as needed for moderate pain.    Historical Provider, MD  levothyroxine (SYNTHROID, LEVOTHROID) 75 MCG tablet Take 75 mcg by mouth daily before breakfast.    Historical Provider, MD  lidocaine (LIDODERM) 5 % Place 1 patch onto the skin daily as needed (for pain). Apply to both knees and right shoulder. Remove & Discard patch within 12 hours or as directed by MD    Historical Provider, MD  Multiple Vitamin (MULTIVITAMIN) tablet Take 1 tablet by mouth daily.    Historical Provider, MD  nystatin-triamcinolone ointment (MYCOLOG) Apply 1 application topically 3 (three) times a week. Corners of mouth 06/27/15   Historical Provider, MD  polyethylene glycol (MIRALAX / GLYCOLAX) packet Take 8.5-17 g by mouth daily as needed for moderate constipation.  12/28/12   Joseph ArtJessica U Vann, DO  potassium gluconate (SM POTASSIUM) 595 MG TABS tablet Take 595 mg by mouth daily.    Historical Provider, MD  traMADol (ULTRAM) 50 MG tablet Take 50 mg by mouth every 6 (six) hours.  Historical Provider, MD    Allergies:  Allergies  Allergen Reactions  . Tetanus Toxoid, Adsorbed Swelling  . Tetanus Toxoids Swelling    Social History   Social History  . Marital status: Widowed    Spouse name: N/A  . Number of children: N/A  . Years of education: 40   Occupational History  . RETIRED     Social History Main Topics  . Smoking status: Former Smoker    Packs/day: 1.50    Years: 20.00    Types: Cigarettes    Quit date: 11/21/1965  . Smokeless tobacco: Never Used  . Alcohol use Yes     Comment: Rarely   .  Drug use: No  . Sexual activity: No   Other Topics Concern  . Not on file   Social History Narrative   Marital Status: Widowed    Children: Daughter Oceanographer)   Pets:  Dog (1)    Living Situation: Lives alone.   Occupation: Retired Training and development officer)    Education: Engineer, agricultural    Tobacco Use/Exposure:  She smoked 1 - 1 1/2 ppd for 20 years. She quit in 1967.    Alcohol Use:  Rarely   Drug Use:  None   Diet:  Regular   Exercise:  None   Hobbies: Playing with Dog/ Taking Picture/ Reading              Family History  Problem Relation Age of Onset  . Stroke Father   . Arthritis Daughter   . Arthritis Sister      ROS:  Please see the history of present illness.     All other systems reviewed and negative.    Physical Exam: Blood pressure 120/70, pulse (!) 58, height 4\' 11"  (1.499 m), weight 124 lb (56.2 kg). General: Well developed, well nourished female in no acute distresssitting in a wheelchair Head: Normocephalic, atraumatic, sclera non-icteric, no xanthomas, nares are without discharge. EENT: normal  Lymph Nodes:  none Neck: Negative for carotid bruits.  . Back:without scoliosis kyphosis  Lungs: Clear bilaterally to auscultation without wheezes, rales, or rhonchi. Breathing is unlabored. Heart:  Irregular rate and rhythm with an early systolic murmur . No rubs, or gallops appreciated. Abdomen: Soft, non-tender, non-distended with normoactive bowel sounds. No hepatomegaly. No rebound/guarding. No obvious abdominal masses. Msk:  Strength and tone appear normal for age. Extremities: No clubbing or cyanosis. No  edema.  Distal pedal pulses are 2+ and equal bilaterally. Skin: Warm and Dry Neuro: Alert and oriented X 3. CN III-XII intact Grossly normal sensory and motor function .nearly deaf Psych:  Responds to questions appropriately with a normal affect.      Labs: Cardiac Enzymes No results for input(s): CKTOTAL, CKMB, TROPONINI in the last 72 hours. CBC Lab  Results  Component Value Date   WBC 8.5 10/19/2014   HGB 11.7 (L) 10/19/2014   HCT 35.1 (L) 10/19/2014   MCV 78.7 10/19/2014   PLT 451 (H) 10/19/2014   PROTIME: No results for input(s): LABPROT, INR in the last 72 hours. Chemistry No results for input(s): NA, K, CL, CO2, BUN, CREATININE, CALCIUM, PROT, BILITOT, ALKPHOS, ALT, AST, GLUCOSE in the last 168 hours.  Invalid input(s): LABALBU Lipids No results found for: CHOL, HDL, LDLCALC, TRIG BNP No results found for: PROBNP Thyroid Function Tests: No results for input(s): TSH, T4TOTAL, T3FREE, THYROIDAB in the last 72 hours.  Invalid input(s): FREET3 Miscellaneous No results found for: DDIMER  Radiology/Studies:  No results found.  EKG:  Atrial fibrillation at 58 Intervals-/08/41 Axis 130 RSR prime   Assessment and Plan:  Atrial fibrillation-permanent  Right axis deviation   Patient is tolerating her atrial fibrillation. There is no evidence of heart failure. No bleeding complications on apixoban    Sherryl MangesSteven Klein review

## 2016-07-08 ENCOUNTER — Ambulatory Visit (INDEPENDENT_AMBULATORY_CARE_PROVIDER_SITE_OTHER): Payer: Medicare Other | Admitting: Internal Medicine

## 2016-07-08 VITALS — BP 120/70 | HR 58 | Ht 59.0 in | Wt 124.0 lb

## 2016-07-08 DIAGNOSIS — I482 Chronic atrial fibrillation, unspecified: Secondary | ICD-10-CM

## 2016-07-08 NOTE — Patient Instructions (Signed)
Medication Instructions: Your physician recommends that you continue on your current medications as directed. Please refer to the Current Medication list given to you today.   Labwork: None Ordered  Procedures/Testing: None Ordered  Follow-Up: Your physician wants you to follow-up in 6 MONTHS with Francis Dowseenee Ursuy, PA and 1 YEAR with Dr. Graciela HusbandsKlein. You will receive a reminder letter in the mail two months in advance. If you don't receive a letter, please call our office to schedule the follow-up appointment.   Any Additional Special Instructions Will Be Listed Below (If Applicable).     If you need a refill on your cardiac medications before your next appointment, please call your pharmacy.

## 2016-11-08 ENCOUNTER — Other Ambulatory Visit: Payer: Self-pay | Admitting: *Deleted

## 2016-11-08 MED ORDER — DILTIAZEM HCL ER COATED BEADS 120 MG PO CP24: ORAL_CAPSULE | ORAL | 2 refills | Status: DC

## 2017-06-30 ENCOUNTER — Ambulatory Visit (INDEPENDENT_AMBULATORY_CARE_PROVIDER_SITE_OTHER): Payer: Medicare Other | Admitting: Internal Medicine

## 2017-06-30 VITALS — BP 142/78 | HR 66 | Ht 59.0 in | Wt 144.0 lb

## 2017-06-30 DIAGNOSIS — I10 Essential (primary) hypertension: Secondary | ICD-10-CM

## 2017-06-30 DIAGNOSIS — I482 Chronic atrial fibrillation, unspecified: Secondary | ICD-10-CM

## 2017-06-30 NOTE — Patient Instructions (Signed)
Medication Instructions: Your physician recommends that you continue on your current medications as directed. Please refer to the Current Medication list given to you today.   Labwork: Your physician recommends that you have lab work today: BMET and CBC   Procedures/Testing: None Ordered   Follow-Up: Your physician wants you to follow-up in: 1 YEAR with Dr. Graciela HusbandsKlein. You will receive a reminder letter in the mail two months in advance. If you don't receive a letter, please call our office to schedule the follow-up appointment.   Any Additional Special Instructions Will Be Listed Below (If Applicable).     If you need a refill on your cardiac medications before your next appointment, please call your pharmacy.

## 2017-06-30 NOTE — Progress Notes (Signed)
ELECTROPHYSIOLOGY Progress NOTE  Patient ID: Kelli Pena, MRN: [id number], DOB/AGE: [birth year] 83 y.o. Admit date: (Not on file) Date of Consult: 06/30/2017  Primary Physician: Gillian Scarce, MD Primary Cardiologist: new -fformer TB Consulting Physician N/A     HPI Kelli Pena is a 83 y.o. female  seen in followup for atrial fibrillation. It was first noted in 2015. Because of unknown duration no cardioversion was pursued. She is currently on apixoban for a CHADS-VASc score of 4 (age-61 gender-1 blood pressure-1) she has had a remote history of problems with GI bleeding occurring in the context of a hip fracture.  Walks w walker  No cp or sob No edema Tolerating meds No bleeding    Date Cr Hgb  10/17 1.22 13.8          Echocardiogram 2015 demonstrated normal LV function normal LA size     Past Medical History:  Diagnosis Date  . Arthritis   . Atrial fibrillation (HCC)   . Complication of anesthesia    trouble waking up, very cold, hypotension  . Diabetes mellitus without complication (HCC)   . Fibromyalgia   . Hypertension   . Hypothyroid       Surgical History:  Past Surgical History:  Procedure Laterality Date  . CHOLECYSTECTOMY  1980s?   open chole  . EYE SURGERY    . HIP SURGERY       Home Meds: Prior to Admission medications   Medication Sig Start Date End Date Taking? Authorizing Provider  apixaban (ELIQUIS) 2.5 MG TABS tablet Take 1 tablet (2.5 mg total) by mouth 2 (two) times daily. 11/02/15   Cassell Clement, MD  diltiazem (CARDIZEM CD) 120 MG 24 hr capsule TAKE 1 CAPSULE (120 MG TOTAL) BY MOUTH DAILY. 11/02/15   Cassell Clement, MD  DULoxetine (CYMBALTA) 60 MG capsule Take 60 mg by mouth every morning.  12/12/13 12/12/14  Gillian Scarce, MD  gabapentin (NEURONTIN) 600 MG tablet Take 600 mg by mouth at bedtime.     Historical Provider, MD  hydrochlorothiazide (HYDRODIURIL) 25 MG tablet Take 1 tablet (25 mg total) by mouth daily.  11/02/15   Cassell Clement, MD  HYDROcodone-acetaminophen (NORCO/VICODIN) 5-325 MG per tablet Take one tablet by mouth every 4 hours as needed for pain 08/13/14   Oneal Grout, MD  ibuprofen (ADVIL,MOTRIN) 200 MG tablet Take 200 mg by mouth every 8 (eight) hours as needed for moderate pain.    Historical Provider, MD  levothyroxine (SYNTHROID, LEVOTHROID) 75 MCG tablet Take 75 mcg by mouth daily before breakfast.    Historical Provider, MD  lidocaine (LIDODERM) 5 % Place 1 patch onto the skin daily as needed (for pain). Apply to both knees and right shoulder. Remove & Discard patch within 12 hours or as directed by MD    Historical Provider, MD  Multiple Vitamin (MULTIVITAMIN) tablet Take 1 tablet by mouth daily.    Historical Provider, MD  nystatin-triamcinolone ointment (MYCOLOG) Apply 1 application topically 3 (three) times a week. Corners of mouth 06/27/15   Historical Provider, MD  polyethylene glycol (MIRALAX / GLYCOLAX) packet Take 8.5-17 g by mouth daily as needed for moderate constipation.  12/28/12   Joseph Art, DO  potassium gluconate (SM POTASSIUM) 595 MG TABS tablet Take 595 mg by mouth daily.    Historical Provider, MD  traMADol (ULTRAM) 50 MG tablet Take 50 mg by mouth every 6 (six) hours.    Historical Provider, MD  Allergies:  Allergies  Allergen Reactions  . Tetanus Toxoid, Adsorbed Swelling  . Tetanus Toxoids Swelling      ROS:  Please see the history of present illness.     All other systems reviewed and negative.    Physical Exam: Blood pressure (!) 142/78, pulse 66, height 4\' 11"  (1.499 m), weight 144 lb (65.3 kg), SpO2 95 %. Well developed and nourished in no acute distress HENT normal  B hearing aids Neck supple with JVP-flat Carotids brisk and full without bruits Clear Irregular rate and rhythm, no murmurs or gallops Abd-soft with active BS without hepatomegaly No Clubbing cyanosis edema Skin-warm and dry A & Oriented  Grossly normal sensory and motor  function Sitting in wheel chair     Labs: Cardiac Enzymes No results for input(s): CKTOTAL, CKMB, TROPONINI in the last 72 hours. CBC Lab Results  Component Value Date   WBC 8.5 10/19/2014   HGB 11.7 (L) 10/19/2014   HCT 35.1 (L) 10/19/2014   MCV 78.7 10/19/2014   PLT 451 (H) 10/19/2014   PROTIME: No results for input(s): LABPROT, INR in the last 72 hours. Chemistry No results for input(s): NA, K, CL, CO2, BUN, CREATININE, CALCIUM, PROT, BILITOT, ALKPHOS, ALT, AST, GLUCOSE in the last 168 hours.  Invalid input(s): LABALBU Lipids No results found for: CHOL, HDL, LDLCALC, TRIG BNP No results found for: PROBNP Thyroid Function Tests: No results for input(s): TSH, T4TOTAL, T3FREE, THYROIDAB in the last 72 hours.  Invalid input(s): FREET3 Miscellaneous No results found for: DDIMER  Radiology/Studies:  No results found.  EKG:   Atrial fibrillation at 61 Intervals-/07/41 Axis -19 RSR prime   Assessment and Plan:  Atrial fibrillation-permanent  Right axis deviation  On Anticoagulation;  No bleeding issues      Sherryl MangesSteven Klein review

## 2017-07-01 LAB — CBC WITH DIFFERENTIAL/PLATELET
BASOS: 0 %
Basophils Absolute: 0 10*3/uL (ref 0.0–0.2)
EOS (ABSOLUTE): 0.3 10*3/uL (ref 0.0–0.4)
Eos: 4 %
HEMOGLOBIN: 14.2 g/dL (ref 11.1–15.9)
Hematocrit: 42.7 % (ref 34.0–46.6)
IMMATURE GRANS (ABS): 0 10*3/uL (ref 0.0–0.1)
Immature Granulocytes: 0 %
LYMPHS: 30 %
Lymphocytes Absolute: 2.2 10*3/uL (ref 0.7–3.1)
MCH: 30.9 pg (ref 26.6–33.0)
MCHC: 33.3 g/dL (ref 31.5–35.7)
MCV: 93 fL (ref 79–97)
MONOCYTES: 8 %
MONOS ABS: 0.6 10*3/uL (ref 0.1–0.9)
Neutrophils Absolute: 4.3 10*3/uL (ref 1.4–7.0)
Neutrophils: 58 %
Platelets: 389 10*3/uL — ABNORMAL HIGH (ref 150–379)
RBC: 4.6 x10E6/uL (ref 3.77–5.28)
RDW: 15.1 % (ref 12.3–15.4)
WBC: 7.3 10*3/uL (ref 3.4–10.8)

## 2017-07-01 LAB — BASIC METABOLIC PANEL
BUN/Creatinine Ratio: 22 (ref 12–28)
BUN: 20 mg/dL (ref 10–36)
CALCIUM: 9.4 mg/dL (ref 8.7–10.3)
CO2: 26 mmol/L (ref 20–29)
Chloride: 99 mmol/L (ref 96–106)
Creatinine, Ser: 0.89 mg/dL (ref 0.57–1.00)
GFR calc Af Amer: 64 mL/min/{1.73_m2} (ref >59)
GFR calc non Af Amer: 55 mL/min/{1.73_m2} — ABNORMAL LOW (ref >59)
GLUCOSE: 121 mg/dL — AB (ref 65–99)
Potassium: 3.7 mmol/L (ref 3.5–5.2)
SODIUM: 140 mmol/L (ref 134–144)

## 2017-07-03 ENCOUNTER — Telehealth: Payer: Self-pay

## 2017-07-03 NOTE — Telephone Encounter (Signed)
Spoke with Santina Evansatherine, patients daughter (per DPR) and she is aware and agreeable to normal labs.

## 2017-08-11 ENCOUNTER — Other Ambulatory Visit: Payer: Self-pay | Admitting: Internal Medicine

## 2018-06-02 ENCOUNTER — Emergency Department (HOSPITAL_BASED_OUTPATIENT_CLINIC_OR_DEPARTMENT_OTHER): Payer: Medicare Other

## 2018-06-02 ENCOUNTER — Encounter (HOSPITAL_COMMUNITY): Payer: Self-pay

## 2018-06-02 ENCOUNTER — Other Ambulatory Visit: Payer: Self-pay

## 2018-06-02 ENCOUNTER — Emergency Department (HOSPITAL_COMMUNITY)
Admission: EM | Admit: 2018-06-02 | Discharge: 2018-06-02 | Disposition: A | Discharge status: Home / Self Care | Payer: Medicare Other | Attending: Emergency Medicine | Admitting: Emergency Medicine

## 2018-06-02 DIAGNOSIS — Z79899 Other long term (current) drug therapy: Secondary | ICD-10-CM | POA: Diagnosis not present

## 2018-06-02 DIAGNOSIS — M79604 Pain in right leg: Secondary | ICD-10-CM | POA: Diagnosis present

## 2018-06-02 DIAGNOSIS — Z87891 Personal history of nicotine dependence: Secondary | ICD-10-CM | POA: Diagnosis not present

## 2018-06-02 DIAGNOSIS — I482 Chronic atrial fibrillation, unspecified: Secondary | ICD-10-CM

## 2018-06-02 DIAGNOSIS — I10 Essential (primary) hypertension: Secondary | ICD-10-CM | POA: Diagnosis not present

## 2018-06-02 DIAGNOSIS — E039 Hypothyroidism, unspecified: Secondary | ICD-10-CM | POA: Insufficient documentation

## 2018-06-02 DIAGNOSIS — M79609 Pain in unspecified limb: Secondary | ICD-10-CM | POA: Diagnosis not present

## 2018-06-02 DIAGNOSIS — E119 Type 2 diabetes mellitus without complications: Secondary | ICD-10-CM | POA: Diagnosis not present

## 2018-06-02 NOTE — Progress Notes (Signed)
*  PRELIMINARY RESULTS* Vascular Ultrasound Right lower extremity venous duplex has been completed.  Preliminary findings: No evidence of deep or superficial vein thrombosis in the visualized veins. Heterogeneous baker's cyst seen in right popliteal fossa. Preliminary results given to Dr. Preston FleetingGlick @ 8:30.  Kelli FischerCharlotte C Von Pena 06/02/2018, 9:14 AM

## 2018-06-02 NOTE — ED Notes (Signed)
Ultrasound at bedside with patient. 

## 2018-06-02 NOTE — Discharge Instructions (Addendum)
Continue local wound care.  Talk with Dr. Graciela HusbandsKlein about whether you should start the Eliquis.

## 2018-06-02 NOTE — ED Notes (Signed)
Bed: RU04WA16 Expected date:  Expected time:  Means of arrival:  Comments: EMS 84 yo female wound lower right leg-nodules-blood work-elevated d-dimer

## 2018-06-02 NOTE — ED Provider Notes (Addendum)
Kelli Pena Provider Note   CSN: [id number] Arrival date & time: 06/02/18  0032     History   Chief Complaint Chief Complaint  Patient presents with  . Leg Pain  . Abnormal Lab    HPI Kelli Pena is a 84 y.o. female.  The history is provided by the patient and a relative.  She has history of hypertension, diabetes, persistent atrial fibrillation and comes in because of an elevated d-dimer obtained as an outpatient.  She had bumped her right leg about 1 months ago and had leg swelling about 2 weeks ago which was felt to be due to excess salt consumption.  She had developed some redness around the sore on her leg.  She saw her PCP today who felt she probably had superficial phlebitis and obtained a d-dimer which came back high and she was directed to come to the ED.  She is not complaining of any pain in her leg nor she complaining of any chest pain or dyspnea.  Also, of note, she had been prescribed apixaban for atrial fibrillation, but has not been taking it.  At that time that atrial fibrillation was identified, she had some bleeding episodes and decided never to start the medication.  She is not on any aspirin either.  Past Medical History:  Diagnosis Date  . Arthritis   . Atrial fibrillation (HCC)   . Complication of anesthesia    trouble waking up, very cold, hypotension  . Diabetes mellitus without complication (HCC)   . Fibromyalgia   . Hypertension   . Hypothyroid     Patient Active Problem List   Diagnosis Date Noted  . C. difficile colitis 10/18/2014  . Abdominal pain   . Hyponatremia 10/15/2014  . Sepsis (HCC) 10/15/2014  . Leukocytosis 10/15/2014  . Hypokalemia   . Atrial fibrillation (HCC)   . Tibia fracture 07/17/2014  . Osteoarthritis 06/16/2013  . Unspecified vitamin D deficiency 06/16/2013  . Neuropathy 04/05/2013  . Femur fracture, left (HCC) 04/05/2013  . Hypothyroidism 04/02/2013  . Essential hypertension,  benign 04/02/2013    Past Surgical History:  Procedure Laterality Date  . CHOLECYSTECTOMY  1980s?   open chole  . EYE SURGERY    . HIP SURGERY       OB History   None      Home Medications    Prior to Admission medications   Medication Sig Start Date End Date Taking? Authorizing Provider  apixaban (ELIQUIS) 2.5 MG TABS tablet Take 1 tablet (2.5 mg total) by mouth 2 (two) times daily. 11/02/15   Cassell Clement, MD  diltiazem (CARDIZEM CD) 120 MG 24 hr capsule TAKE 1 CAPSULE (120 MG TOTAL) BY MOUTH DAILY. 08/11/17   Duke Salvia, MD  gabapentin (NEURONTIN) 600 MG tablet Take 600 mg by mouth at bedtime.     [provider]  hydrochlorothiazide (HYDRODIURIL) 25 MG tablet Take 1 tablet (25 mg total) by mouth daily. 11/02/15   Cassell Clement, MD  HYDROcodone-acetaminophen (NORCO/VICODIN) 5-325 MG per tablet Take one tablet by mouth every 4 hours as needed for pain 08/13/14   Oneal Grout, MD  ibuprofen (ADVIL,MOTRIN) 200 MG tablet Take 200 mg by mouth every 8 (eight) hours as needed for moderate pain.    [provider]  levothyroxine (SYNTHROID, LEVOTHROID) 75 MCG tablet Take 75 mcg by mouth daily before breakfast.    [provider]  lidocaine (LIDODERM) 5 % Place 1 patch onto the skin daily as  needed (for pain). Apply to both knees and right shoulder. Remove & Discard patch within 12 hours or as directed by MD    [provider]  Multiple Vitamin (MULTIVITAMIN) tablet Take 1 tablet by mouth daily.    [provider]  nystatin-triamcinolone ointment (MYCOLOG) Apply 1 application topically 3 (three) times a week. Corners of mouth 06/27/15   [provider]  polyethylene glycol (MIRALAX / GLYCOLAX) packet Take 8.5-17 g by mouth daily as needed for moderate constipation.  12/28/12   Joseph Art, DO  potassium gluconate (SM POTASSIUM) 595 MG TABS tablet Take 595 mg by mouth daily.    [provider]  traMADol (ULTRAM) 50  MG tablet Take 50 mg by mouth every 6 (six) hours.    [provider]    Family History Family History  Problem Relation Age of Onset  . Stroke Father   . Arthritis Daughter   . Arthritis Sister     Social History Social History   Tobacco Use  . Smoking status: Former Smoker    Packs/day: 1.50    Years: 20.00    Pack years: 30.00    Types: Cigarettes    Last attempt to quit: 11/21/1965    Years since quitting: 52.5  . Smokeless tobacco: Never Used  Substance Use Topics  . Alcohol use: Yes    Comment: Rarely   . Drug use: No     Allergies   Tetanus toxoid, adsorbed and Tetanus toxoids   Review of Systems Review of Systems  All other systems reviewed and are negative.    Physical Exam Updated Vital Signs BP (!) 156/69 (BP Location: Right Arm)   Pulse 70   Temp 97.9 F (36.6 C) (Oral)   Resp 20   Ht 4\' 11"  (1.499 m)   Wt 65.3 kg (144 lb)   SpO2 94%   BMI 29.08 kg/m   Physical Exam  Nursing note and vitals reviewed.  84 year old female, resting comfortably and in no acute distress. Vital signs are significant for elevated systolic blood pressure. Oxygen saturation is 94%, which is normal. Head is normocephalic and atraumatic. PERRLA, EOMI. Oropharynx is clear. Neck is nontender and supple without adenopathy or JVD. Back is nontender and there is no CVA tenderness. Lungs are clear without rales, wheezes, or rhonchi. Chest is nontender. Heart has regular rate and rhythm without murmur. Abdomen is soft, flat, nontender without masses or hepatosplenomegaly and peristalsis is normoactive. Extremities: Scab present on the anterior aspect of the right lower leg with mild area of erythema surrounding it.  There is no significant swelling, no calf tenderness, no cord palpable.  Negative Homans sign.  There is 1+ pedal edema bilaterally. Skin is warm and dry without rash. Neurologic: Mental status is normal, cranial nerves are intact, there are no motor or  sensory deficits.  ED Treatments / Results   Procedures Procedures  Medications Ordered in ED Medications - No data to display   Initial Impression / Assessment and Plan / ED Course  I have reviewed the triage vital signs and the nursing notes.  Slowly healing lesion on the right lower leg with elevated d-dimer.  I have a very low index of suspicion for DVT given rather benign clinical appearance.  With no chest pain or dyspnea, I do not see an indication for CT angiogram.  Will get venous Doppler study to rule out DVT.  Family has difficulty getting her in and out of home, so she  will be kept in the ED for Doppler to be done in the morning.  Old records are reviewed confirming outpatient visit with positive d-dimer.  She has been followed by cardiology, but they were under the impression that she was taking apixaban.  8:32 AM Venous Doppler shows no evidence of DVT, but Baker's cyst is noted.  She will be discharged.  She needs to discuss risks and benefits of anticoagulation with her cardiologist.  She is instructed to make a follow-up appointment.  CHA2DS2/VAS Stroke Risk Points  Current as of 13 minutes ago     5 >= 2 Points: High Risk  1 - 1.99 Points: Medium Risk  0 Points: Low Risk    This is the only CHA2DS2/VAS Stroke Risk Points available for the past  year.:  Last Change: N/A     Details    This score determines the patient's risk of having a stroke if the  patient has atrial fibrillation.       Points Metrics  0 Has Congestive Heart Failure:  No    Current as of 13 minutes ago  0 Has Vascular Disease:  No    Current as of 13 minutes ago  1 Has Hypertension:  Yes    Current as of 13 minutes ago  2 Age:  [age]    Current as of 13 minutes ago  1 Has Diabetes:  Yes    Current as of 13 minutes ago  0 Had Stroke:  No  Had TIA:  No  Had thromboembolism:  No    Current as of 13 minutes ago  1 Female:  Yes    Current as of 13 minutes ago      Final Clinical  Impressions(s) / ED Diagnoses   Final diagnoses:  Right leg pain  Chronic atrial fibrillation [facility](HCC)    ED Discharge Orders    None       Dione BoozeGlick, Chief Walkup, MD 06/02/18 [id number]    Dione BoozeGlick, Georgie Haque, MD 06/02/18 [phone]

## 2018-06-02 NOTE — ED Triage Notes (Signed)
Pt comes to ed, via ems, a month ago with injury to leg, unknown cause.Two weeks ago noticed edema in legs.Treated. Yesterday they noticed knots above the wound, took to her to her  provider today, d-dimer over 3000. Hx of afib. Not currently on any blood thinners. Pain in knee bilateral chronic issue. V/s on arrival 118/84, pulse 90, spo2 89.

## 2018-06-24 ENCOUNTER — Encounter (HOSPITAL_COMMUNITY): Payer: Self-pay

## 2018-06-24 ENCOUNTER — Ambulatory Visit (HOSPITAL_COMMUNITY)
Admission: EM | Admit: 2018-06-24 | Discharge: 2018-06-24 | Disposition: A | Discharge status: Home / Self Care | Payer: Medicare Other | Attending: Family Medicine | Admitting: Family Medicine

## 2018-06-24 DIAGNOSIS — K625 Hemorrhage of anus and rectum: Secondary | ICD-10-CM | POA: Diagnosis not present

## 2018-06-24 DIAGNOSIS — R1032 Left lower quadrant pain: Secondary | ICD-10-CM

## 2018-06-24 LAB — POCT I-STAT, CHEM 8
BUN: 13 mg/dL (ref 8–23)
CHLORIDE: 97 mmol/L — AB (ref 98–111)
CREATININE: 0.9 mg/dL (ref 0.44–1.00)
Calcium, Ion: 1.07 mmol/L — ABNORMAL LOW (ref 1.15–1.40)
Glucose, Bld: 126 mg/dL — ABNORMAL HIGH (ref 70–99)
HEMATOCRIT: 41 % (ref 36.0–46.0)
Hemoglobin: 13.9 g/dL (ref 12.0–15.0)
POTASSIUM: 3.3 mmol/L — AB (ref 3.5–5.1)
Sodium: 135 mmol/L (ref 135–145)
TCO2: 26 mmol/L (ref 22–32)

## 2018-06-24 LAB — OCCULT BLOOD, POC DEVICE: Fecal Occult Bld: POSITIVE — AB

## 2018-06-24 NOTE — ED Provider Notes (Signed)
[facility]   [id number] 06/24/18 Arrival Time: 1237  SUBJECTIVE:  Present with daughter who is a RN   Kelli Pena is a 84 y.o. female hx significant for HTN, DM, atrial fibrillation, fibromyalgia, and hypothyroid who presents with complaint of two episodes of BRB per rectum that began last night and this morning, following numerous episodes of diarrhea that began 2 days ago.  Patient reports diarrhea after taking "too much" of her miralax at home.  States she was having diarrhea every hour for approximately 24 hours.  Diarrhea now resolved.   Reports rectal burning.  Describes symptoms as intermittent and stable.  Has not tried OTC medications.  Worse with having a BM.  Reports hx of c. diff.  Last BM this AM and one small formed.  Reports a healthy appetite   Denies fever, chills, appetite changes, weight changes, nausea, vomiting, chest pain, SOB, constipation, hematochezia, melena, dysuria, difficulty urinating, increased frequency or urgency, flank pain, loss of bowel or bladder function.  Patient has never had a colonoscopy.    Currently taking one baby ASA daily.    No LMP recorded. Patient is postmenopausal.  ROS: As per HPI.  Past Medical History:  Diagnosis Date  . Arthritis   . Atrial fibrillation (HCC)   . Complication of anesthesia    trouble waking up, very cold, hypotension  . Diabetes mellitus without complication (HCC)   . Fibromyalgia   . Hypertension   . Hypothyroid    Past Surgical History:  Procedure Laterality Date  . CHOLECYSTECTOMY  1980s?   open chole  . EYE SURGERY    . HIP SURGERY     Allergies  Allergen Reactions  . Tetanus Toxoid, Adsorbed Swelling    Severe swelling on arm.  . Tetanus Toxoids Swelling   No current facility-administered medications on file prior to encounter.    Current Outpatient Medications on File Prior to Encounter  Medication Sig Dispense Refill  . apixaban (ELIQUIS) 2.5 MG TABS tablet Take 1 tablet (2.5  mg total) by mouth 2 (two) times daily. (Patient not taking: Reported on 06/02/2018) 180 tablet 3  . diltiazem (CARDIZEM CD) 120 MG 24 hr capsule TAKE 1 CAPSULE (120 MG TOTAL) BY MOUTH DAILY. 90 capsule 3  . gabapentin (NEURONTIN) 600 MG tablet Take 600 mg by mouth at bedtime.     . hydrochlorothiazide (HYDRODIURIL) 25 MG tablet Take 1 tablet (25 mg total) by mouth daily. 90 tablet 0  . HYDROcodone-acetaminophen (NORCO/VICODIN) 5-325 MG per tablet Take one tablet by mouth every 4 hours as needed for pain 180 tablet 0  . ibuprofen (ADVIL,MOTRIN) 200 MG tablet Take 200-400 mg by mouth every 8 (eight) hours as needed for moderate pain.     Marland Kitchen levothyroxine (SYNTHROID, LEVOTHROID) 75 MCG tablet Take 75 mcg by mouth daily before breakfast.    . lidocaine (LIDODERM) 5 % Place 1 patch onto the skin daily as needed (for pain). Apply to both knees and right shoulder. Remove & Discard patch within 12 hours or as directed by MD    . Multiple Vitamin (MULTIVITAMIN) tablet Take 1 tablet by mouth daily.    Marland Kitchen nystatin-triamcinolone ointment (MYCOLOG) Apply 1 application topically 3 (three) times a week. Corners of mouth    . pioglitazone (ACTOS) 15 MG tablet Take 15 mg by mouth at bedtime.  1  . polyethylene glycol (MIRALAX / GLYCOLAX) packet Take 8.5-17 g by mouth daily as needed for moderate constipation.     . potassium  gluconate (SM POTASSIUM) 595 MG TABS tablet Take 595 mg by mouth daily.    . traMADol (ULTRAM) 50 MG tablet Take 50 mg by mouth every 6 (six) hours.     Social History   Socioeconomic History  . Marital status: Widowed    Spouse name: Not on file  . Number of children: Not on file  . Years of education: 13  . Highest education level: Not on file  Occupational History  . Occupation: RETIRED   Social Needs  . Financial resource strain: Not on file  . Food insecurity:    Worry: Not on file    Inability: Not on file  . Transportation needs:    Medical: Not on file    Non-medical: Not on  file  Tobacco Use  . Smoking status: Former Smoker    Packs/day: 1.50    Years: 20.00    Pack years: 30.00    Types: Cigarettes    Last attempt to quit: 11/21/1965    Years since quitting: 52.6  . Smokeless tobacco: Never Used  Substance and Sexual Activity  . Alcohol use: Yes    Comment: Rarely   . Drug use: No  . Sexual activity: Never  Lifestyle  . Physical activity:    Days per week: Not on file    Minutes per session: Not on file  . Stress: Not on file  Relationships  . Social connections:    Talks on phone: Not on file    Gets together: Not on file    Attends religious service: Not on file    Active member of club or organization: Not on file    Attends meetings of clubs or organizations: Not on file    Relationship status: Not on file  . Intimate partner violence:    Fear of current or ex partner: Not on file    Emotionally abused: Not on file    Physically abused: Not on file    Forced sexual activity: Not on file  Other Topics Concern  . Not on file  Social History Narrative   Marital Status: Widowed    Children: Daughter Oceanographer)   Pets:  Dog (1)    Living Situation: Lives alone.   Occupation: Retired Training and development officer)    Education: Engineer, agricultural    Tobacco Use/Exposure:  She smoked 1 - 1 1/2 ppd for 20 years. She quit in 1967.    Alcohol Use:  Rarely   Drug Use:  None   Diet:  Regular   Exercise:  None   Hobbies: Playing with Dog/ Taking Picture/ Reading            Family History  Problem Relation Age of Onset  . Stroke Father   . Arthritis Daughter   . Arthritis Sister      OBJECTIVE:  Vitals:   06/24/18 1301  BP: (!) 153/70  Pulse: 77  Resp: 19  Temp: 98.8 F (37.1 C)  SpO2: 98%    General appearance: Alert and oriented to person and place; appears younger than stated age HEENT: NCAT.  Oropharynx clear.  Lungs: clear to auscultation bilaterally without adventitious breath sounds Heart: regular rate and rhythm.  Radial pulses 2+  symmetrical bilaterally Abdomen: soft, non-distended; normal active bowel sounds; tender to deep palpation about the LLQ; nontender at McBurney's point; negative rebound; no guarding Rectal: External exam negative for external hemorrhoid, obvious masses or fissures.  Mildly tender in 6 o'clock position  Internal: tender in 6 o'clock  position; no masses appreciated; hemoccult positive Extremities: no edema; symmetrical with no gross deformities Skin: warm and dry Neurologic: normal gait Psychological: alert and cooperative; normal mood and affect  Labs: Results for orders placed or performed during the hospital encounter of 06/24/18 (from the past 24 hour(s))  Occult blood, poc device     Status: Abnormal   Collection Time: 06/24/18  3:11 PM  Result Value Ref Range   Fecal Occult Bld POSITIVE (A) NEGATIVE  I-STAT, chem 8     Status: Abnormal   Collection Time: 06/24/18  3:12 PM  Result Value Ref Range   Sodium 135 135 - 145 mmol/L   Potassium 3.3 (L) 3.5 - 5.1 mmol/L   Chloride 97 (L) 98 - 111 mmol/L   BUN 13 8 - 23 mg/dL   Creatinine, Ser 0.980.90 0.44 - 1.00 mg/dL   Glucose, Bld 119126 (H) 70 - 99 mg/dL   Calcium, Ion 1.471.07 (L) 1.15 - 1.40 mmol/L   TCO2 26 22 - 32 mmol/L   Hemoglobin 13.9 12.0 - 15.0 g/dL   HCT 82.941.0 56.236.0 - 13.046.0 %     MDM:   Hx of BRB per rectum following multiple episodes of diarrhea.  VS stable.  PE unremarkable for obvious fissure or actively bleeding hemorrhoid, with mild LLQ discomfort to deep palpation.  Blood work did not show anemia.  Patient nontoxic appearance.  Discussed differential diagnosis with patient and family.  DDX includes: internal bleeding hemorrhoid, colon cancer, diverticular bleed, vs. anal fissure.  Gave patient and family the option to have a further work-up and evaluation in the ED.  Patient would like to trial diet changes and close follow up with PCP this week.  Given strict return and ER precautions.  Discussed patient case with Dr.  Delton SeeNelson  ASSESSMENT & PLAN:  1. Rectal bleeding   2. Abdominal discomfort in left lower quadrant    No orders of the defined types were placed in this encounter.  Hemoccult positive CBC did not show anemia, just mild hypokalemia There does not appear to be any immediate threat to life Close follow up with PCP this week is recommended for further evaluation and management of potential GI bleed Please go to the ER if you experience any new or worsening symptoms.   Reviewed expectations re: course of current medical issues. Questions answered. Outlined signs and symptoms indicating need for more acute intervention. Patient verbalized understanding. After Visit Summary given.   Rennis HardingWurst, Yomaris Palecek, PA-C 06/24/18 [phone]

## 2018-06-24 NOTE — Discharge Instructions (Signed)
Hemoccult positive CBC did not show anemia, just mild hypokalemia There does not appear to be any immediate threat to life Close follow up with PCP this week is recommended for further evaluation and management of potential GI bleed Please go to the ER if you experience any new or worsening symptoms.

## 2018-06-24 NOTE — ED Triage Notes (Signed)
Pt accidentally took too many laxatives and has been having diarrhea for two days. She then started having bright red blood in her stool. This RN talked with family about going to the ER for further evaluation.  The patient states that she does not have blood in her stool but has a hemorrhoid from all of the diarrhea. Family member reports looking at the patients bottom and seeing a hemorrhoid.

## 2018-07-26 ENCOUNTER — Ambulatory Visit: Payer: Medicare Other | Admitting: Physician Assistant

## 2018-08-22 ENCOUNTER — Other Ambulatory Visit: Payer: Self-pay | Admitting: Internal Medicine

## 2018-08-22 MED ORDER — DILTIAZEM HCL ER COATED BEADS 120 MG PO CP24: ORAL_CAPSULE | ORAL | 0 refills | Status: DC

## 2018-08-22 NOTE — Telephone Encounter (Signed)
Pt's medication was sent to pt's pharmacy as requested. Confirmation received.  °

## 2018-08-30 ENCOUNTER — Ambulatory Visit: Payer: Medicare Other | Admitting: Internal Medicine

## 2018-09-04 NOTE — Progress Notes (Deleted)
Cardiology Office Note Date:  09/04/2018  Patient ID:  Kelli Pena, Kelli Pena March 13, 1920, MRN [id number] PCP:  Gillian Scarce, MD  Cardiologist:  Dr. Graciela Husbands  ***refresh   Chief Complaint: annual visit  History of Present Illness: Kelli Pena is a 84 y.o. female with history of HTN, hypothyroidism, and AFib.  She comes today to be seen for dr. Graciela Husbands.  Last seen by him Aug 2018.  At that visit was doing well, tolerating her meds.  He mentions her AF 1st noted in 2015, given unknown duration, DCCV was not persued.   *** symptoms *** bleeding/eliquis, weight?/dose 2.5 *** meds   Past Medical History:  Diagnosis Date  . Arthritis   . Atrial fibrillation (HCC)   . Complication of anesthesia    trouble waking up, very cold, hypotension  . Diabetes mellitus without complication (HCC)   . Fibromyalgia   . Hypertension   . Hypothyroid     Past Surgical History:  Procedure Laterality Date  . CHOLECYSTECTOMY  1980s?   open chole  . EYE SURGERY    . HIP SURGERY      Current Outpatient Medications  Medication Sig Dispense Refill  . apixaban (ELIQUIS) 2.5 MG TABS tablet Take 1 tablet (2.5 mg total) by mouth 2 (two) times daily. (Patient not taking: Reported on 06/02/2018) 180 tablet 3  . diltiazem (CARDIZEM CD) 120 MG 24 hr capsule TAKE 1 CAPSULE (120 MG TOTAL) BY MOUTH DAILY. Please keep upcoming appt in October before anymore refills. Thank you 90 capsule 0  . gabapentin (NEURONTIN) 600 MG tablet Take 600 mg by mouth at bedtime.     . hydrochlorothiazide (HYDRODIURIL) 25 MG tablet Take 1 tablet (25 mg total) by mouth daily. 90 tablet 0  . HYDROcodone-acetaminophen (NORCO/VICODIN) 5-325 MG per tablet Take one tablet by mouth every 4 hours as needed for pain 180 tablet 0  . ibuprofen (ADVIL,MOTRIN) 200 MG tablet Take 200-400 mg by mouth every 8 (eight) hours as needed for moderate pain.     Marland Kitchen levothyroxine (SYNTHROID, LEVOTHROID) 75 MCG tablet Take 75 mcg by mouth daily before  breakfast.    . lidocaine (LIDODERM) 5 % Place 1 patch onto the skin daily as needed (for pain). Apply to both knees and right shoulder. Remove & Discard patch within 12 hours or as directed by MD    . Multiple Vitamin (MULTIVITAMIN) tablet Take 1 tablet by mouth daily.    Marland Kitchen nystatin-triamcinolone ointment (MYCOLOG) Apply 1 application topically 3 (three) times a week. Corners of mouth    . pioglitazone (ACTOS) 15 MG tablet Take 15 mg by mouth at bedtime.  1  . polyethylene glycol (MIRALAX / GLYCOLAX) packet Take 8.5-17 g by mouth daily as needed for moderate constipation.     . potassium gluconate (SM POTASSIUM) 595 MG TABS tablet Take 595 mg by mouth daily.    . traMADol (ULTRAM) 50 MG tablet Take 50 mg by mouth every 6 (six) hours.     No current facility-administered medications for this visit.     Allergies:   Tetanus toxoid, adsorbed and Tetanus toxoids   Social History:  The patient  reports that she quit smoking about 52 years ago. Her smoking use included cigarettes. She has a 30.00 pack-year smoking history. She has never used smokeless tobacco. She reports that she drinks alcohol. She reports that she does not use drugs.   Family History:  The patient's family history includes Arthritis in her daughter and sister;  Stroke in her father.  ROS:  Please see the history of present illness.  All other systems are reviewed and otherwise negative.   PHYSICAL EXAM: *** VS:  There were no vitals taken for this visit. BMI: There is no height or weight on file to calculate BMI. Well nourished, well developed, in no acute distress  HEENT: normocephalic, atraumatic  Neck: no JVD, carotid bruits or masses Cardiac:  *** RRR; no significant murmurs, no rubs, or gallops Lungs:  *** CTA b/l, no wheezing, rhonchi or rales  Abd: soft, nontender MS: no deformity or *** atrophy Ext: *** no edema  Skin: warm and dry, no rash Neuro:  No gross deficits appreciated Psych: euthymic mood, full  affect   EKG:  Done today and reviewed by myself ***  07/23/14; TTE Study Conclusions - Left ventricle: The cavity size was normal. Wall thickness was increased in a pattern of mild LVH. Systolic function was vigorous. The estimated ejection fraction was in the range of 65% to 70%. Wall motion was normal; there were no regional wall motion abnormalities. - Mitral valve: There was mild regurgitation.  Recent Labs: 06/24/2018: BUN 13; Creatinine, Ser 0.90; Hemoglobin 13.9; Potassium 3.3; Sodium 135  No results found for requested labs within last 8760 hours.   CrCl cannot be calculated (Patient's most recent lab result is older than the maximum 21 days allowed.).   Wt Readings from Last 3 Encounters:  06/02/18 144 lb (65.3 kg)  06/30/17 144 lb (65.3 kg)  07/08/16 124 lb (56.2 kg)     Other studies reviewed: Additional studies/records reviewed today include: summarized above  ASSESSMENT AND PLAN:  1. Permanent Afib     CHA2DS2Vasc is 4, on Eliquis, *** dosed at 2.5mg  BID     ***  2. HTN     ***   Disposition: F/u with ***  Current medicines are reviewed at length with the patient today.  The patient did not have any concerns regarding medicines.***  Signed, Francis Dowse, PA-C 09/04/2018 12:49 PM     [facility] [address] [place] [address] [phone] (office)  [phone] (fax)

## 2018-09-05 ENCOUNTER — Ambulatory Visit: Payer: Medicare Other | Admitting: Physician Assistant

## 2018-09-23 NOTE — Progress Notes (Signed)
Cardiology Office Note Date:  09/26/2018  Patient ID:  Kelli Pena, Kelli Pena 06/14/1920, MRN [id number] PCP:  Gillian Scarce, MD  Cardiologist:  Dr. Graciela Husbands   Chief Complaint: annual visit  History of Present Illness: Kelli Pena is a 84 y.o. female with history of HTN, hypothyroidism, and AFib.  She comes today to be seen for Dr. Graciela Husbands.  Last seen by him Aug 2018.  At that visit was doing well, tolerating her meds.  He mentions her AF 1st noted in 2015, given unknown duration, DCCV was not persued.   She comes today accompanied by her daughter/son-in-law.  The patient is very hard of hearing, though seems in-tune to her daughter's voice and the visit goes well.  The daughter reports that they never got her started on Eliquis.  That every time they went to start it she had some kind of unprovoked bleeding issue, or GO issue.  And in the end decided that despite her stroke risk, not to start it at all.   The patient lives by her self, he daughter visits every other day and a RN opposite her.  She has terrible knees though up to a couple weeks ago able to ambulate enough to get her daily needs taken care of.  She is seeing ortho today to get new knee braces, and has been confined to her wheelchair the last few weeks with her knees giving out. No reports of CP or cardiac awareness at all.  No palpitations or SOB. n near syncope or syncope.   Past Medical History:  Diagnosis Date  . Arthritis   . Atrial fibrillation (HCC)   . Complication of anesthesia    trouble waking up, very cold, hypotension  . Diabetes mellitus without complication (HCC)   . Fibromyalgia   . Hypertension   . Hypothyroid     Past Surgical History:  Procedure Laterality Date  . CHOLECYSTECTOMY  1980s?   open chole  . EYE SURGERY    . HIP SURGERY      Current Outpatient Medications  Medication Sig Dispense Refill  . diltiazem (CARDIZEM CD) 120 MG 24 hr capsule TAKE 1 CAPSULE (120 MG TOTAL) BY MOUTH DAILY.  Please keep upcoming appt in October before anymore refills. Thank you 90 capsule 0  . DULoxetine (CYMBALTA) 20 MG capsule Take 20 mg by mouth daily with supper.    . gabapentin (NEURONTIN) 600 MG tablet Take 600 mg by mouth at bedtime.     . hydrochlorothiazide (HYDRODIURIL) 25 MG tablet Take 1 tablet (25 mg total) by mouth daily. 90 tablet 0  . HYDROcodone-acetaminophen (NORCO/VICODIN) 5-325 MG per tablet Take one tablet by mouth every 4 hours as needed for pain 180 tablet 0  . ibuprofen (ADVIL,MOTRIN) 200 MG tablet Take 200-400 mg by mouth every 8 (eight) hours as needed for moderate pain.     Marland Kitchen levothyroxine (SYNTHROID, LEVOTHROID) 75 MCG tablet Take 75 mcg by mouth daily before breakfast.    . lidocaine (LIDODERM) 5 % Place 1 patch onto the skin daily as needed (for pain). Apply to both knees and right shoulder. Remove & Discard patch within 12 hours or as directed by MD    . Multiple Vitamin (MULTIVITAMIN) tablet Take 1 tablet by mouth daily.    Marland Kitchen nystatin-triamcinolone ointment (MYCOLOG) Apply 1 application topically 3 (three) times a week. Corners of mouth    . pioglitazone (ACTOS) 15 MG tablet Take 15 mg by mouth at bedtime.  1  .  polyethylene glycol (MIRALAX / GLYCOLAX) packet Take 8.5-17 g by mouth daily as needed for moderate constipation.     . potassium gluconate (SM POTASSIUM) 595 MG TABS tablet Take 595 mg by mouth daily.    . traMADol (ULTRAM) 50 MG tablet Take 50 mg by mouth every 6 (six) hours.     No current facility-administered medications for this visit.     Allergies:   Tetanus toxoid, adsorbed and Tetanus toxoids   Social History:  The patient  reports that she quit smoking about 52 years ago. Her smoking use included cigarettes. She has a 30.00 pack-year smoking history. She has never used smokeless tobacco. She reports that she drinks alcohol. She reports that she does not use drugs.   Family History:  The patient's family history includes Arthritis in her daughter  and sister; Stroke in her father.  ROS:  Please see the history of present illness.  All other systems are reviewed and otherwise negative.   PHYSICAL EXAM:  VS:  BP 124/62   Pulse 68   Ht 4\' 11"  (1.499 m)   Wt 142 lb (64.4 kg)   BMI 28.68 kg/m  BMI: Body mass index is 28.68 kg/m. Well nourished, well developed, in no acute distress  HEENT: normocephalic, atraumatic  Neck: no JVD, carotid bruits or masses Cardiac:  irreg-irreg; no significant murmurs, no rubs, or gallops Lungs:  CTA b/l, no wheezing, rhonchi or rales  Abd: soft, nontender MS: no deformity, age appropriate atrophy Ext: no edema  Skin: warm and dry, no rash Neuro:  No gross deficits appreciated Psych: euthymic mood, full affect   EKG:  Done today and reviewed by myself AFib, 68bpm  07/23/14; TTE Study Conclusions - Left ventricle: The cavity size was normal. Wall thickness was increased in a pattern of mild LVH. Systolic function was vigorous. The estimated ejection fraction was in the range of 65% to 70%. Wall motion was normal; there were no regional wall motion abnormalities. - Mitral valve: There was mild regurgitation.  Recent Labs: 06/24/2018: BUN 13; Creatinine, Ser 0.90; Hemoglobin 13.9; Potassium 3.3; Sodium 135  No results found for requested labs within last 8760 hours.   CrCl cannot be calculated (Patient's most recent lab result is older than the maximum 21 days allowed.).   Wt Readings from Last 3 Encounters:  09/26/18 142 lb (64.4 kg)  06/02/18 144 lb (65.3 kg)  06/30/17 144 lb (65.3 kg)     Other studies reviewed: Additional studies/records reviewed today include: summarized above  ASSESSMENT AND PLAN:  1. Permanent Afib     CHA2DS2Vasc is 4  Lengthy discussion today regarding AFib and her stroke risk, the  Daughter is a retired Engineer, civil (consulting), and states she has had a number of discussion with the patient, and we did again today.  At this juncture they feel best to proceed without  anticoagulation  2. HTN     Looks OK, no changes  3. Hypokalemia on last available labs     They report a f/u with PMD labs reported to be OK  4. Recurrent LGIB, hx of both small and large colon perforations     Follows with D. Mann, GI    Disposition: F/u with [place] annually.  She sees her PMD and other MDs periodically through the year, we can see her sooner if needed,  Current medicines are reviewed at length with the patient today.  The patient did not have any concerns regarding medicines.  Signed, Francis Dowse, PA-C 09/26/2018  9:53 AM     [facility] [address] [place] [address] [phone] (office)  [phone] (fax)

## 2018-09-26 ENCOUNTER — Ambulatory Visit (INDEPENDENT_AMBULATORY_CARE_PROVIDER_SITE_OTHER): Payer: Medicare Other | Admitting: Physician Assistant

## 2018-09-26 ENCOUNTER — Encounter (INDEPENDENT_AMBULATORY_CARE_PROVIDER_SITE_OTHER): Payer: Self-pay

## 2018-09-26 VITALS — BP 124/62 | HR 68 | Ht 59.0 in | Wt 142.0 lb

## 2018-09-26 DIAGNOSIS — I10 Essential (primary) hypertension: Secondary | ICD-10-CM | POA: Diagnosis not present

## 2018-09-26 DIAGNOSIS — I4821 Permanent atrial fibrillation: Secondary | ICD-10-CM

## 2018-09-26 NOTE — Patient Instructions (Signed)
Medication Instructions:     If you need a refill on your cardiac medications before your next appointment, please call your pharmacy.  Labwork: NONE ORDERED  TODAY    Testing/Procedures: NONE ORDERED  TODAY    Follow-Up: Your physician wants you to follow-up in: ONE YEAR WITH KLEIN/URSUY You will receive a reminder letter in the mail two months in advance. If you don't receive a letter, please call our office to schedule the follow-up appointment.      Any Other Special Instructions Will Be Listed Below (If Applicable).

## 2018-10-26 ENCOUNTER — Other Ambulatory Visit: Payer: Self-pay | Admitting: Chiropractic Medicine

## 2018-10-26 DIAGNOSIS — M48061 Spinal stenosis, lumbar region without neurogenic claudication: Secondary | ICD-10-CM

## 2018-11-10 ENCOUNTER — Ambulatory Visit
Admission: RE | Admit: 2018-11-10 | Discharge: 2018-11-10 | Disposition: A | Discharge status: Home / Self Care | Payer: Medicare Other | Source: Ambulatory Visit | Attending: Chiropractic Medicine | Admitting: Chiropractic Medicine

## 2018-11-10 DIAGNOSIS — M48061 Spinal stenosis, lumbar region without neurogenic claudication: Secondary | ICD-10-CM

## 2018-11-19 ENCOUNTER — Emergency Department (HOSPITAL_COMMUNITY)
Admission: EM | Admit: 2018-11-19 | Discharge: 2018-11-19 | Disposition: A | Discharge status: Home / Self Care | Payer: Medicare Other | Attending: Emergency Medicine | Admitting: Emergency Medicine

## 2018-11-19 ENCOUNTER — Encounter (HOSPITAL_COMMUNITY): Payer: Self-pay | Admitting: Emergency Medicine

## 2018-11-19 DIAGNOSIS — R531 Weakness: Secondary | ICD-10-CM | POA: Insufficient documentation

## 2018-11-19 DIAGNOSIS — E119 Type 2 diabetes mellitus without complications: Secondary | ICD-10-CM | POA: Insufficient documentation

## 2018-11-19 DIAGNOSIS — Z7984 Long term (current) use of oral hypoglycemic drugs: Secondary | ICD-10-CM | POA: Insufficient documentation

## 2018-11-19 DIAGNOSIS — N390 Urinary tract infection, site not specified: Secondary | ICD-10-CM | POA: Insufficient documentation

## 2018-11-19 DIAGNOSIS — I10 Essential (primary) hypertension: Secondary | ICD-10-CM | POA: Insufficient documentation

## 2018-11-19 DIAGNOSIS — Z87891 Personal history of nicotine dependence: Secondary | ICD-10-CM | POA: Diagnosis not present

## 2018-11-19 DIAGNOSIS — E039 Hypothyroidism, unspecified: Secondary | ICD-10-CM | POA: Diagnosis not present

## 2018-11-19 DIAGNOSIS — R32 Unspecified urinary incontinence: Secondary | ICD-10-CM

## 2018-11-19 LAB — URINALYSIS, ROUTINE W REFLEX MICROSCOPIC
BILIRUBIN URINE: NEGATIVE
Glucose, UA: NEGATIVE mg/dL
Hgb urine dipstick: NEGATIVE
KETONES UR: NEGATIVE mg/dL
Leukocytes, UA: NEGATIVE
NITRITE: NEGATIVE
PH: 9 — AB (ref 5.0–8.0)
Protein, ur: NEGATIVE mg/dL
Specific Gravity, Urine: 1.012 (ref 1.005–1.030)

## 2018-11-19 LAB — COMPREHENSIVE METABOLIC PANEL
ALK PHOS: 53 U/L (ref 38–126)
ALT: 12 U/L (ref 0–44)
AST: 16 U/L (ref 15–41)
Albumin: 3.3 g/dL — ABNORMAL LOW (ref 3.5–5.0)
Anion gap: 11 (ref 5–15)
BUN: 17 mg/dL (ref 8–23)
CALCIUM: 9.3 mg/dL (ref 8.9–10.3)
CO2: 33 mmol/L — AB (ref 22–32)
CREATININE: 0.86 mg/dL (ref 0.44–1.00)
Chloride: 96 mmol/L — ABNORMAL LOW (ref 98–111)
GFR calc Af Amer: >60 mL/min (ref >60)
GFR, EST NON AFRICAN AMERICAN: 57 mL/min — AB (ref >60)
Glucose, Bld: 123 mg/dL — ABNORMAL HIGH (ref 70–99)
Potassium: 3.6 mmol/L (ref 3.5–5.1)
SODIUM: 140 mmol/L (ref 135–145)
Total Bilirubin: 0.8 mg/dL (ref 0.3–1.2)
Total Protein: 6.4 g/dL — ABNORMAL LOW (ref 6.5–8.1)

## 2018-11-19 LAB — CBC
HEMATOCRIT: 38 % (ref 36.0–46.0)
HEMOGLOBIN: 12.1 g/dL (ref 12.0–15.0)
MCH: 30.1 pg (ref 26.0–34.0)
MCHC: 31.8 g/dL (ref 30.0–36.0)
MCV: 94.5 fL (ref 80.0–100.0)
Platelets: 484 10*3/uL — ABNORMAL HIGH (ref 150–400)
RBC: 4.02 MIL/uL (ref 3.87–5.11)
RDW: 14.2 % (ref 11.5–15.5)
WBC: 8.5 10*3/uL (ref 4.0–10.5)
nRBC: 0 % (ref 0.0–0.2)

## 2018-11-19 MED ORDER — OXYCODONE-ACETAMINOPHEN 5-325 MG PO TABS
1.0000 | ORAL_TABLET | Freq: Once | ORAL | Status: AC
  Administered 2018-11-19: 1 via ORAL
  Filled 2018-11-19: qty 1

## 2018-11-19 MED ORDER — SODIUM CHLORIDE 0.9 % IV BOLUS
1000.0000 mL | Freq: Once | INTRAVENOUS | Status: AC
  Administered 2018-11-19: 1000 mL via INTRAVENOUS

## 2018-11-19 NOTE — ED Notes (Signed)
Asked family at bedside if they would like to transport the pt home by EMS or if they would like to arragne their own transportation. PTs family stated that they would like to transport the pt back themselves. The risks associated with transporting the pt were explained to the family.

## 2018-11-19 NOTE — ED Triage Notes (Signed)
The patient comes from home, The patient;s daughter called due to concerns about new urinary incontinence (3days), back pain (MRI done on 11/10/18) and weakness. The daughter reports she is so weak she can't pull herself up or stand. When EMS arrive she pulled her self up to a standing position. They also report she slept all the way to the hospital.

## 2018-11-19 NOTE — ED Provider Notes (Signed)
[facility]-EMERGENCY DEPT Provider Note   CSN: [id number] Arrival date & time: 11/19/18  1512     History   Chief Complaint Chief Complaint  Patient presents with  . Fatigue  . Urinary Incontinence    HPI Kelli Pena is a 84 y.o. female.  HPI 84 year old female who is brought to the emergency department by her daughter with new urinary incontinence over the past several days.  She has some generalized weakness both in her arms and legs today and the daughter is concerned about the possibility of a urinary tract infection as a cause of her generalized weakness and urinary incontinence.  No dysuria.  No fevers or chills.  No vomiting.  Rectal temp is 99.3 on arrival to the emergency department.  Patient denies any significant back pain at this time.  She denies abdominal pain.  Family reports that her generalized weakness is significantly improved as compared to earlier this morning.  Baseline mental status.  She is been having back issues for 6 to 8 months.  She underwent MRI of her lumbar spine recently which demonstrated severe degenerative changes of her low back and some mild to moderate spinal stenosis without otherwise acute pathology.     Past Medical History:  Diagnosis Date  . Arthritis   . Atrial fibrillation (HCC)   . Complication of anesthesia    trouble waking up, very cold, hypotension  . Diabetes mellitus without complication (HCC)   . Fibromyalgia   . Hypertension   . Hypothyroid     Patient Active Problem List   Diagnosis Date Noted  . C. difficile colitis 10/18/2014  . Abdominal pain   . Hyponatremia 10/15/2014  . Sepsis (HCC) 10/15/2014  . Leukocytosis 10/15/2014  . Hypokalemia   . Atrial fibrillation (HCC)   . Tibia fracture 07/17/2014  . Osteoarthritis 06/16/2013  . Unspecified vitamin D deficiency 06/16/2013  . Neuropathy 04/05/2013  . Femur fracture, left (HCC) 04/05/2013  . Hypothyroidism 04/02/2013  . Essential  hypertension, benign 04/02/2013    Past Surgical History:  Procedure Laterality Date  . CHOLECYSTECTOMY  1980s?   open chole  . EYE SURGERY    . HIP SURGERY       OB History   No obstetric history on file.      Home Medications    Prior to Admission medications   Medication Sig Start Date End Date Taking? Authorizing Provider  calcium carbonate (TUMS - DOSED IN MG ELEMENTAL CALCIUM) 500 MG chewable tablet Chew 1 tablet by mouth daily.   Yes [provider]  CRANBERRY-VITAMIN C PO Take 1 tablet by mouth daily.   Yes [provider]  diltiazem (CARDIZEM CD) 120 MG 24 hr capsule TAKE 1 CAPSULE (120 MG TOTAL) BY MOUTH DAILY. Please keep upcoming appt in October before anymore refills. Thank you 08/22/18  Yes Duke SalviaKlein, Steven C, MD  DULoxetine (CYMBALTA) 20 MG capsule Take 20 mg by mouth daily with supper. 08/28/18  Yes [provider]  gabapentin (NEURONTIN) 600 MG tablet Take 600 mg by mouth at bedtime.    Yes [provider]  hydrochlorothiazide (HYDRODIURIL) 25 MG tablet Take 1 tablet (25 mg total) by mouth daily. 11/02/15  Yes Cassell ClementBrackbill, Thomas, MD  HYDROcodone-acetaminophen (NORCO) 10-325 MG tablet Take 1 tablet by mouth every 6 (six) hours as needed for moderate pain or severe pain.   Yes [provider]  ibuprofen (ADVIL,MOTRIN) 200 MG tablet Take 200-400 mg by mouth every 8 (eight) hours as  needed for moderate pain.    Yes [provider]  levothyroxine (SYNTHROID, LEVOTHROID) 88 MCG tablet Take 88 mcg by mouth daily before breakfast.   Yes [provider]  Lutein 20 MG TABS Take 1 tablet by mouth daily.   Yes [provider]  MAGNESIUM PO Take 1 tablet by mouth daily.   Yes [provider]  Multiple Vitamin (MULTIVITAMIN) tablet Take 1 tablet by mouth daily.   Yes [provider]  pioglitazone (ACTOS) 15 MG tablet Take 15 mg by mouth at bedtime. 05/24/18  Yes [provider]    HYDROcodone-acetaminophen (NORCO/VICODIN) 5-325 MG per tablet Take one tablet by mouth every 4 hours as needed for pain Patient not taking: Reported on 11/19/2018 08/13/14   Oneal Grout, MD  lidocaine (LIDODERM) 5 % Place 1 patch onto the skin daily as needed (for pain). Apply to both knees and right shoulder. Remove & Discard patch within 12 hours or as directed by MD    [provider]  nystatin-triamcinolone ointment (MYCOLOG) Apply 1 application topically 3 (three) times a week. Corners of mouth 06/27/15   [provider]  polyethylene glycol (MIRALAX / GLYCOLAX) packet Take 8.5-17 g by mouth daily as needed for moderate constipation.  12/28/12   Joseph Art, DO  traMADol (ULTRAM) 50 MG tablet Take 50 mg by mouth 3 (three) times daily as needed for moderate pain or severe pain.     [provider]    Family History Family History  Problem Relation Age of Onset  . Stroke Father   . Arthritis Daughter   . Arthritis Sister     Social History Social History   Tobacco Use  . Smoking status: Former Smoker    Packs/day: 1.50    Years: 20.00    Pack years: 30.00    Types: Cigarettes    Last attempt to quit: 11/21/1965    Years since quitting: 53.0  . Smokeless tobacco: Never Used  Substance Use Topics  . Alcohol use: Yes    Comment: Rarely   . Drug use: No     Allergies   Tetanus toxoid, adsorbed and Tetanus toxoids   Review of Systems Review of Systems  All other systems reviewed and are negative.    Physical Exam Updated Vital Signs BP 120/75   Pulse 64   Temp 99.3 F (37.4 C) (Rectal)   Resp 15   SpO2 97%   Physical Exam Vitals signs and nursing note reviewed.  Constitutional:      General: She is not in acute distress.    Appearance: She is well-developed.  HENT:     Head: Normocephalic and atraumatic.  Neck:     Musculoskeletal: Normal range of motion.  Cardiovascular:     Rate and Rhythm: Normal rate and regular rhythm.      Heart sounds: Normal heart sounds.  Pulmonary:     Effort: Pulmonary effort is normal.     Breath sounds: Normal breath sounds.  Abdominal:     General: There is no distension.     Palpations: Abdomen is soft.     Tenderness: There is no abdominal tenderness.  Musculoskeletal: Normal range of motion.     Comments: 5 of 5 strength in bilateral upper and lower extremity major muscle groups.  Full range of motion of bilateral upper and lower extremity major joints.  Skin:    General: Skin is warm and dry.  Neurological:     Mental Status: She  is alert and oriented to person, place, and time.  Psychiatric:        Judgment: Judgment normal.      ED Treatments / Results  Labs (all labs ordered are listed, but only abnormal results are displayed) Labs Reviewed  CBC - Abnormal; Notable for the following components:      Result Value   Platelets 484 (*)    All other components within normal limits  COMPREHENSIVE METABOLIC PANEL - Abnormal; Notable for the following components:   Chloride 96 (*)    CO2 33 (*)    Glucose, Bld 123 (*)    Total Protein 6.4 (*)    Albumin 3.3 (*)    GFR calc non Af Amer 57 (*)    All other components within normal limits  URINALYSIS, ROUTINE W REFLEX MICROSCOPIC - Abnormal; Notable for the following components:   pH 9.0 (*)    All other components within normal limits  URINE CULTURE    EKG None  Radiology No results found.  Procedures Procedures (including critical care time)  Medications Ordered in ED Medications  sodium chloride 0.9 % bolus 1,000 mL (1,000 mLs Intravenous New Bag/Given 11/19/18 1818)  oxyCODONE-acetaminophen (PERCOCET/ROXICET) 5-325 MG per tablet 1 tablet (1 tablet Oral Given 11/19/18 1806)     Initial Impression / Assessment and Plan / ED Course  I have reviewed the triage vital signs and the nursing notes.  Pertinent labs & imaging results that were available during my care of the patient were reviewed by me and  considered in my medical decision making (see chart for details).     Patient is overall well-appearing.  Her generalized weakness seems to have resolved.  She is moving all 4 extremities.  Family agrees that she is at her baseline now.  Labs and urine demonstrate no significant abnormality.  Urine culture sent.  Repeat abdominal exam without tenderness.  Patient was able to sit up for lung examination.  She only transfers from her bed to her wheelchair.  No indication for additional work-up at this time.  Family agreeable to outpatient primary care follow-up.  I personally reviewed her recent MRI scan.  There is likely no indication for any acute surgical intervention especially given her advanced age of almost 84 years old  Final Clinical Impressions(s) / ED Diagnoses   Final diagnoses:  Urinary incontinence, unspecified type  Generalized weakness    ED Discharge Orders    None       Azalia Bilisampos, Katlin Bortner, MD 11/19/18 1900

## 2018-11-21 LAB — URINE CULTURE: Culture: <10000 — AB

## 2018-11-23 ENCOUNTER — Other Ambulatory Visit: Payer: Self-pay

## 2018-11-23 MED ORDER — DILTIAZEM HCL ER COATED BEADS 120 MG PO CP24: ORAL_CAPSULE | ORAL | 3 refills | Status: DC

## 2019-01-29 ENCOUNTER — Emergency Department (HOSPITAL_COMMUNITY): Payer: Medicare Other

## 2019-01-29 ENCOUNTER — Emergency Department (HOSPITAL_COMMUNITY)
Admission: EM | Admit: 2019-01-29 | Discharge: 2019-01-29 | Disposition: A | Discharge status: Home / Self Care | Payer: Medicare Other | Attending: Emergency Medicine | Admitting: Emergency Medicine

## 2019-01-29 ENCOUNTER — Emergency Department (HOSPITAL_BASED_OUTPATIENT_CLINIC_OR_DEPARTMENT_OTHER)
Admit: 2019-01-29 | Discharge: 2019-01-29 | Disposition: A | Discharge status: Home / Self Care | Payer: Medicare Other | Attending: Emergency Medicine | Admitting: Emergency Medicine

## 2019-01-29 ENCOUNTER — Other Ambulatory Visit: Payer: Self-pay

## 2019-01-29 DIAGNOSIS — Z7982 Long term (current) use of aspirin: Secondary | ICD-10-CM | POA: Insufficient documentation

## 2019-01-29 DIAGNOSIS — R52 Pain, unspecified: Secondary | ICD-10-CM

## 2019-01-29 DIAGNOSIS — I4891 Unspecified atrial fibrillation: Secondary | ICD-10-CM | POA: Insufficient documentation

## 2019-01-29 DIAGNOSIS — G8929 Other chronic pain: Secondary | ICD-10-CM

## 2019-01-29 DIAGNOSIS — Z79899 Other long term (current) drug therapy: Secondary | ICD-10-CM | POA: Insufficient documentation

## 2019-01-29 DIAGNOSIS — E039 Hypothyroidism, unspecified: Secondary | ICD-10-CM | POA: Insufficient documentation

## 2019-01-29 DIAGNOSIS — I10 Essential (primary) hypertension: Secondary | ICD-10-CM | POA: Insufficient documentation

## 2019-01-29 DIAGNOSIS — M545 Low back pain, unspecified: Secondary | ICD-10-CM

## 2019-01-29 DIAGNOSIS — Z87891 Personal history of nicotine dependence: Secondary | ICD-10-CM | POA: Insufficient documentation

## 2019-01-29 DIAGNOSIS — M79604 Pain in right leg: Secondary | ICD-10-CM | POA: Diagnosis not present

## 2019-01-29 DIAGNOSIS — R410 Disorientation, unspecified: Secondary | ICD-10-CM | POA: Diagnosis not present

## 2019-01-29 DIAGNOSIS — M79605 Pain in left leg: Secondary | ICD-10-CM | POA: Insufficient documentation

## 2019-01-29 DIAGNOSIS — E119 Type 2 diabetes mellitus without complications: Secondary | ICD-10-CM | POA: Insufficient documentation

## 2019-01-29 LAB — CBC WITH DIFFERENTIAL/PLATELET
ABS IMMATURE GRANULOCYTES: 0.03 10*3/uL (ref 0.00–0.07)
BASOS ABS: 0.1 10*3/uL (ref 0.0–0.1)
BASOS PCT: 1 %
Eosinophils Absolute: 0.3 10*3/uL (ref 0.0–0.5)
Eosinophils Relative: 3 %
HCT: 45 % (ref 36.0–46.0)
HEMOGLOBIN: 14.5 g/dL (ref 12.0–15.0)
IMMATURE GRANULOCYTES: 0 %
LYMPHS PCT: 15 %
Lymphs Abs: 1.2 10*3/uL (ref 0.7–4.0)
MCH: 29.5 pg (ref 26.0–34.0)
MCHC: 32.2 g/dL (ref 30.0–36.0)
MCV: 91.6 fL (ref 80.0–100.0)
MONO ABS: 0.6 10*3/uL (ref 0.1–1.0)
Monocytes Relative: 8 %
NEUTROS ABS: 5.8 10*3/uL (ref 1.7–7.7)
NEUTROS PCT: 73 %
Platelets: 443 10*3/uL — ABNORMAL HIGH (ref 150–400)
RBC: 4.91 MIL/uL (ref 3.87–5.11)
RDW: 13.9 % (ref 11.5–15.5)
WBC: 8 10*3/uL (ref 4.0–10.5)
nRBC: 0 % (ref 0.0–0.2)

## 2019-01-29 LAB — URINALYSIS, ROUTINE W REFLEX MICROSCOPIC
Bilirubin Urine: NEGATIVE
Glucose, UA: NEGATIVE mg/dL
HGB URINE DIPSTICK: NEGATIVE
Ketones, ur: 5 mg/dL — AB
LEUKOCYTE UA: NEGATIVE
Nitrite: NEGATIVE
Protein, ur: NEGATIVE mg/dL
SPECIFIC GRAVITY, URINE: 1.016 (ref 1.005–1.030)
pH: 8 (ref 5.0–8.0)

## 2019-01-29 LAB — BASIC METABOLIC PANEL
ANION GAP: 11 (ref 5–15)
BUN: 17 mg/dL (ref 8–23)
CHLORIDE: 96 mmol/L — AB (ref 98–111)
CO2: 30 mmol/L (ref 22–32)
Calcium: 9.6 mg/dL (ref 8.9–10.3)
Creatinine, Ser: 0.86 mg/dL (ref 0.44–1.00)
GFR calc non Af Amer: 57 mL/min — ABNORMAL LOW (ref >60)
Glucose, Bld: 141 mg/dL — ABNORMAL HIGH (ref 70–99)
POTASSIUM: 3 mmol/L — AB (ref 3.5–5.1)
SODIUM: 137 mmol/L (ref 135–145)

## 2019-01-29 LAB — TROPONIN I

## 2019-01-29 MED ORDER — POTASSIUM CHLORIDE CRYS ER 20 MEQ PO TBCR: 40.0000 meq | EXTENDED_RELEASE_TABLET | Freq: Once | ORAL | Status: DC

## 2019-01-29 MED ORDER — ACETAMINOPHEN 325 MG PO TABS
650.0000 mg | ORAL_TABLET | Freq: Once | ORAL | Status: AC
  Administered 2019-01-29: 650 mg via ORAL
  Filled 2019-01-29: qty 2

## 2019-01-29 NOTE — ED Provider Notes (Signed)
[facility]-EMERGENCY DEPT Provider Note   CSN: [id number] Arrival date & time: 01/29/19  1151    History   Chief Complaint Chief Complaint  Patient presents with  . Back Pain    HPI Kelli Pena is a 85 y.o. female.     HPI   85 year old female, with a PMH of A. fib on aspirin, HTN, presents with complaints of fusion, back pain and blurred vision.  Per family patient has had episodes for the last 24 to 48 hours or she has been intermittently confused not able to recall words.  Daughter states episodes occur every couple hours and then resolve spontaneously within minutes.  The state that she has been complaining of blurry vision which has been constant for the last 24 hours.  Patient has a chronic history of back pain but symptoms worsened today by radiating down her bilateral legs.  Per daughter last time she had this there was concern for a blood clot.  Patient states pain only of the back and denies any radiation of pain to her legs at this time.  She denies any swelling of her legs.  Daughter also notes patient has had urinary frequency.  She denies any known dysuria daughter states patient is acting appropriately and has returned to baseline.  Patient and family denies any fever, cough, sputum production, chest pain, shortness of breath, nausea, vomiting, abdominal pain.  Past Medical History:  Diagnosis Date  . Arthritis   . Atrial fibrillation (HCC)   . Complication of anesthesia    trouble waking up, very cold, hypotension  . Diabetes mellitus without complication (HCC)   . Fibromyalgia   . Hypertension   . Hypothyroid     Patient Active Problem List   Diagnosis Date Noted  . C. difficile colitis 10/18/2014  . Abdominal pain   . Hyponatremia 10/15/2014  . Sepsis (HCC) 10/15/2014  . Leukocytosis 10/15/2014  . Hypokalemia   . Atrial fibrillation (HCC)   . Tibia fracture 07/17/2014  . Osteoarthritis 06/16/2013  . Unspecified vitamin D  deficiency 06/16/2013  . Neuropathy 04/05/2013  . Femur fracture, left (HCC) 04/05/2013  . Hypothyroidism 04/02/2013  . Essential hypertension, benign 04/02/2013    Past Surgical History:  Procedure Laterality Date  . CHOLECYSTECTOMY  1980s?   open chole  . EYE SURGERY    . HIP SURGERY       OB History   No obstetric history on file.      Home Medications    Prior to Admission medications   Medication Sig Start Date End Date Taking? Authorizing Provider  Artificial Tear Ointment (DRY EYES OP) Apply 1 drop to eye daily as needed (dry eyes).   Yes [provider]  aspirin EC 81 MG tablet Take 162 mg by mouth daily.   Yes [provider]  CRANBERRY-VITAMIN C PO Take 1 tablet by mouth daily.   Yes [provider]  diltiazem (CARDIZEM CD) 120 MG 24 hr capsule TAKE 1 CAPSULE (120 MG TOTAL) BY MOUTH DAILY. Please keep upcoming appt in October before anymore refills. Thank you 11/23/18  Yes Duke Salvia, MD  DULoxetine (CYMBALTA) 30 MG capsule Take 30 mg by mouth daily. 01/17/19  Yes [provider]  gabapentin (NEURONTIN) 600 MG tablet Take 600 mg by mouth at bedtime.    Yes [provider]  hydrochlorothiazide (HYDRODIURIL) 25 MG tablet Take 1 tablet (25 mg total) by mouth daily. 11/02/15  Yes Cassell Clement, MD  HYDROcodone-acetaminophen (  NORCO) 10-325 MG tablet Take 0.5-1 tablets by mouth every 4 (four) hours as needed for moderate pain or severe pain.    Yes [provider]  ibuprofen (ADVIL,MOTRIN) 200 MG tablet Take 200-400 mg by mouth every 8 (eight) hours as needed for moderate pain.    Yes [provider]  levothyroxine (SYNTHROID, LEVOTHROID) 88 MCG tablet Take 88 mcg by mouth daily before breakfast.   Yes [provider]  lidocaine (LIDODERM) 5 % Place 1 patch onto the skin daily as needed (for pain). Apply to both knees and right shoulder. Remove & Discard patch within 12 hours or as directed by MD    Yes [provider]  Lutein 20 MG TABS Take 1 tablet by mouth daily.   Yes [provider]  MAGNESIUM PO Take 1 tablet by mouth daily.   Yes [provider]  Meclizine HCl (BONINE PO) Take 1 tablet by mouth daily as needed (nausea).   Yes [provider]  Multiple Vitamin (MULTIVITAMIN) tablet Take 1 tablet by mouth daily.   Yes [provider]  nystatin-triamcinolone ointment (MYCOLOG) Apply 1 application topically 3 (three) times a week. Corners of mouth 06/27/15  Yes [provider]  pioglitazone (ACTOS) 15 MG tablet Take 15 mg by mouth at bedtime. 05/24/18  Yes [provider]  polyethylene glycol (MIRALAX / GLYCOLAX) packet Take 8.5-17 g by mouth daily as needed for moderate constipation.  12/28/12  Yes Vann, Jessica U, DO  POTASSIUM PO Take 1 tablet by mouth daily.   Yes [provider]  traMADol (ULTRAM) 50 MG tablet Take 50 mg by mouth 3 (three) times daily as needed for moderate pain or severe pain.    Yes [provider]  HYDROcodone-acetaminophen (NORCO/VICODIN) 5-325 MG per tablet Take one tablet by mouth every 4 hours as needed for pain Patient not taking: Reported on 01/29/2019 08/13/14   Oneal Grout, MD    Family History Family History  Problem Relation Age of Onset  . Stroke Father   . Arthritis Daughter   . Arthritis Sister     Social History Social History   Tobacco Use  . Smoking status: Former Smoker    Packs/day: 1.50    Years: 20.00    Pack years: 30.00    Types: Cigarettes    Last attempt to quit: 11/21/1965    Years since quitting: 53.2  . Smokeless tobacco: Never Used  Substance Use Topics  . Alcohol use: Yes    Comment: Rarely   . Drug use: No     Allergies   Tetanus toxoid, adsorbed and Tetanus toxoids   Review of Systems Review of Systems  Constitutional: Negative for chills and fever.  HENT: Negative for rhinorrhea and sore throat.   Eyes: Positive for visual  disturbance.  Respiratory: Negative for cough and shortness of breath.   Cardiovascular: Negative for chest pain and leg swelling.  Gastrointestinal: Negative for abdominal pain, diarrhea, nausea and vomiting.  Genitourinary: Positive for frequency. Negative for dysuria and urgency.  Musculoskeletal: Positive for arthralgias and back pain. Negative for joint swelling and neck pain.  Skin: Negative for rash and wound.  Neurological: Negative for syncope and numbness.  Psychiatric/Behavioral: Positive for confusion.  All other systems reviewed and are negative.    Physical Exam Updated Vital Signs BP (!) 141/101 (BP Location: Left Arm)   Pulse 72   Temp 98.1 F (36.7 C) (Oral)   Resp 16   SpO2 94%   Physical  Exam Vitals signs and nursing note reviewed.  Constitutional:      Appearance: She is well-developed.  HENT:     Head: Normocephalic and atraumatic.  Eyes:     Conjunctiva/sclera: Conjunctivae normal.  Neck:     Musculoskeletal: Neck supple.  Cardiovascular:     Rate and Rhythm: Normal rate and regular rhythm.     Heart sounds: Normal heart sounds. No murmur.  Pulmonary:     Effort: Pulmonary effort is normal. No respiratory distress.     Breath sounds: Normal breath sounds. No wheezing or rales.  Abdominal:     General: Bowel sounds are normal. There is no distension.     Palpations: Abdomen is soft.     Tenderness: There is no abdominal tenderness.  Musculoskeletal: Normal range of motion.        General: No tenderness or deformity.  Skin:    General: Skin is warm and dry.     Findings: No erythema or rash.  Neurological:     Mental Status: She is alert and oriented to person, place, and time.     GCS: GCS eye subscore is 4. GCS verbal subscore is 5. GCS motor subscore is 6.     Cranial Nerves: Cranial nerves are intact.     Sensory: Sensation is intact.     Motor: Motor function is intact.  Psychiatric:        Behavior: Behavior normal.      ED  Treatments / Results  Labs (all labs ordered are listed, but only abnormal results are displayed) Labs Reviewed  CBC WITH DIFFERENTIAL/PLATELET - Abnormal; Notable for the following components:      Result Value   Platelets 443 (*)    All other components within normal limits  BASIC METABOLIC PANEL - Abnormal; Notable for the following components:   Potassium 3.0 (*)    Chloride 96 (*)    Glucose, Bld 141 (*)    GFR calc non Af Amer 57 (*)    All other components within normal limits  TROPONIN I  URINALYSIS, ROUTINE W REFLEX MICROSCOPIC    EKG None  Radiology Vas [place]as Lower Extremity Venous (dvt) (only Mc & Wl 7a-7p)  Result Date: 01/29/2019  Lower Venous Study Indications: Pain.  Performing Technologist: Chanda BusingGregory Collins RVT  Examination Guidelines: A complete evaluation includes B-mode imaging, spectral Doppler, color Doppler, and power Doppler as needed of all accessible portions of each vessel. Bilateral testing is considered an integral part of a complete examination. Limited examinations for reoccurring indications may be performed as noted.  Right Venous Findings: +---------+---------------+---------+-----------+----------+-------+          CompressibilityPhasicitySpontaneityPropertiesSummary +---------+---------------+---------+-----------+----------+-------+ CFV      Full           Yes      Yes                          +---------+---------------+---------+-----------+----------+-------+ SFJ      Full                                                 +---------+---------------+---------+-----------+----------+-------+ FV Prox  Full                                                 +---------+---------------+---------+-----------+----------+-------+  FV Mid   Full                                                 +---------+---------------+---------+-----------+----------+-------+ FV DistalFull                                                  +---------+---------------+---------+-----------+----------+-------+ PFV      Full                                                 +---------+---------------+---------+-----------+----------+-------+ POP      Full           Yes      Yes                          +---------+---------------+---------+-----------+----------+-------+ PTV      Full                                                 +---------+---------------+---------+-----------+----------+-------+ PERO     Full                                                 +---------+---------------+---------+-----------+----------+-------+  Left Venous Findings: +---------+---------------+---------+-----------+----------+-------+          CompressibilityPhasicitySpontaneityPropertiesSummary +---------+---------------+---------+-----------+----------+-------+ CFV      Full           Yes      Yes                          +---------+---------------+---------+-----------+----------+-------+ SFJ      Full                                                 +---------+---------------+---------+-----------+----------+-------+ FV Prox  Full                                                 +---------+---------------+---------+-----------+----------+-------+ FV Mid   Full                                                 +---------+---------------+---------+-----------+----------+-------+ FV DistalFull                                                 +---------+---------------+---------+-----------+----------+-------+  PFV      Full                                                 +---------+---------------+---------+-----------+----------+-------+ POP      Full           Yes      Yes                          +---------+---------------+---------+-----------+----------+-------+ PTV      Full                                                  +---------+---------------+---------+-----------+----------+-------+ PERO     Full                                                 +---------+---------------+---------+-----------+----------+-------+    Summary: Right: There is no evidence of deep vein thrombosis in the lower extremity. A cystic structure measuring 1.9 cm high by 3.7 cm wide by 4.4 cm long is found in the popliteal fossa. Left: There is no evidence of deep vein thrombosis in the lower extremity. No cystic structure found in the popliteal fossa.  *See table(s) above for measurements and observations.    Preliminary     Procedures Procedures (including critical care time)  Medications Ordered in ED Medications  acetaminophen (TYLENOL) tablet 650 mg (650 mg Oral Given 01/29/19 1415)     Initial Impression / Assessment and Plan / ED Course  I have reviewed the triage vital signs and the nursing notes.  Pertinent labs & imaging results that were available during my care of the patient were reviewed by me and considered in my medical decision making (see chart for details).        Patient presented for multiple complaints.  Daughter was concerned patient has had blood clots in her legs given recent leg pain.  She also noted that patient had intermittent difficulty recalling words for the last 2 days.  On physical exam patient is alert and oriented x4.  Patient has no complaints.  Her legs have full range of motion and are neurovascularly intact.  No evidence of DVT on physical exam or ultrasound.  Patient also had head CT which shows no intracranial hemorrhage or acute findings.  Her blood work is shows a slightly low potassium for which she was given potassium in the ED.  Her blood work is otherwise unremarkable.  Her urine shows some ketones, encouraged increased p.o. intake of water.  She has been p.o. tolerant to water in the ED.  Episodes of difficulty with word finding are concerning for possible TIA.  Discussed this  with patient and family.  Offered admission to the hospital for TIA work-up. In-depth discussion, answering all of patient and family's questions.  After discussion of risks, benefits and alternatives, patient and family chose discharge at this time.  Patient does not want to be on blood thinners due to previous issues with blood thinners, perforated bowels and falls.  Management would not likely change given this information.  Still offered work-up with MRI and other blood test.  And family declined further evaluation.  They are agreeable with going home.  Stable for discharge.   At this time there does not appear to be any evidence of an acute emergency medical condition and the patient appears stable for discharge with appropriate outpatient follow up.Diagnosis was discussed with patient who verbalizes understanding and is agreeable to discharge. Pt case discussed with Dr. Freida Busman who agrees with my plan.   Final Clinical Impressions(s) / ED Diagnoses   Final diagnoses:  None    ED Discharge Orders    None       Rueben Bash 01/29/19 1737    Lorre Nick, MD 01/31/19 1240

## 2019-01-29 NOTE — Discharge Instructions (Addendum)
With your primary care provider in 2 days for continued evaluation.  Return to the ED immediately for new or worsening symptoms or concerns, chest pain, shortness of breath, fevers, vomiting, weakness or any concerns at all.

## 2019-01-29 NOTE — ED Provider Notes (Signed)
Medical screening examination/treatment/procedure(s) were conducted as a shared visit with non-physician practitioner(s) and myself.  I personally evaluated the patient during the encounter.  EKG Interpretation  Date/Time:  Tuesday January 29 2019 14:19:18 EDT Ventricular Rate:  85 PR Interval:    QRS Duration: 86 QT Interval:  393 QTC Calculation: 468 R Axis:   -27 Text Interpretation:  Atrial fibrillation Borderline left axis deviation Anterior infarct, old No significant change since last tracing Confirmed by Lorre Nick ([id number]) on 01/29/2019 3:5:32 PM 85 year old female presents worsening chronic back pain rating to her legs.  Doppler studies were negative.  She has no focal deficits.  Daughter states that she has had periods of confusion.  Is already on aspirin and does have history of A. fib.  Will discuss with family about treatment goals   Lorre Nick, MD 01/29/19 [phone]

## 2019-01-29 NOTE — ED Notes (Signed)
Bed: PT46 Expected date:  Expected time:  Means of arrival:  Comments: EMS 90s flank pain

## 2019-01-29 NOTE — ED Triage Notes (Signed)
Per EMS: Pt from home with c/o of back pain radiating to her legs, worse on L side since yesterday.  Daughter states last time she these c/o the pt had a blood clot.  Pt is urinating more, but has no pain with it.  Pt does not have significant hx of UTI.

## 2019-01-29 NOTE — Progress Notes (Signed)
Bilateral lower extremity venous duplex has been completed. Preliminary results can be found in CV Proc through chart review.  Results were given to [facility] PA.  01/29/19 1:11 PM Olen Cordial RVT

## 2019-11-06 ENCOUNTER — Other Ambulatory Visit: Payer: Self-pay | Admitting: Internal Medicine

## 2019-11-21 ENCOUNTER — Telehealth: Payer: Self-pay

## 2019-11-21 MED ORDER — DILTIAZEM HCL ER COATED BEADS 120 MG PO CP24: 120.0000 mg | ORAL_CAPSULE | Freq: Every day | ORAL | 0 refills | Status: DC

## 2019-11-21 NOTE — Telephone Encounter (Signed)
Refill for diltiazem sent to pharmacy.

## 2019-12-25 ENCOUNTER — Other Ambulatory Visit: Payer: Self-pay | Admitting: Internal Medicine

## 2020-01-08 ENCOUNTER — Telehealth: Payer: Self-pay | Admitting: Internal Medicine

## 2020-01-08 MED ORDER — DILTIAZEM HCL ER COATED BEADS 120 MG PO CP24: 120.0000 mg | ORAL_CAPSULE | Freq: Every day | ORAL | 0 refills | Status: DC

## 2020-01-08 NOTE — Telephone Encounter (Signed)
Pt's medication was sent to pt's pharmacy as requested. Confirmation received.  °

## 2020-01-08 NOTE — Telephone Encounter (Signed)
New message     *STAT* If patient is at the pharmacy, call can be transferred to refill team.   1. Which medications need to be refilled? (please list name of each medication and dose if known) cardizem 120 mg   2. Which pharmacy/location (including street and city if local pharmacy) is medication to be sent to? [facility] [address] [phone]  3. Do they need a 30 day or 90 day supply? 30 day--pt was given 15 day supply until appt tomorrow 2/18. Pt appt was RS d/t weather. Pt has enough to last until Saturday.

## 2020-01-09 ENCOUNTER — Ambulatory Visit: Payer: Medicare Other | Admitting: Student

## 2020-01-22 ENCOUNTER — Ambulatory Visit (INDEPENDENT_AMBULATORY_CARE_PROVIDER_SITE_OTHER): Payer: Medicare Other | Admitting: Student

## 2020-01-22 ENCOUNTER — Encounter: Payer: Self-pay | Admitting: Student

## 2020-01-22 ENCOUNTER — Other Ambulatory Visit: Payer: Self-pay

## 2020-01-22 VITALS — BP 118/60 | HR 69 | Ht 60.0 in | Wt 147.0 lb

## 2020-01-22 DIAGNOSIS — I10 Essential (primary) hypertension: Secondary | ICD-10-CM

## 2020-01-22 DIAGNOSIS — I4821 Permanent atrial fibrillation: Secondary | ICD-10-CM | POA: Diagnosis not present

## 2020-01-22 DIAGNOSIS — E876 Hypokalemia: Secondary | ICD-10-CM | POA: Diagnosis not present

## 2020-01-22 NOTE — Patient Instructions (Signed)
Medication Instructions:  none *If you need a refill on your cardiac medications before your next appointment, please call your pharmacy*   Lab Work: none If you have labs (blood work) drawn today and your tests are completely normal, you will receive your results only by: . MyChart Message (if you have MyChart) OR . A paper copy in the mail If you have any lab test that is abnormal or we need to change your treatment, we will call you to review the results.   Testing/Procedures: none   Follow-Up: At [facility], you and your health needs are our priority.  As part of our continuing mission to provide you with exceptional heart care, we have created designated Provider Care Teams.  These Care Teams include your primary Cardiologist (physician) and Advanced Practice Providers (APPs -  Physician Assistants and Nurse Practitioners) who all work together to provide you with the care you need, when you need it.  We recommend signing up for the patient portal called "MyChart".  Sign up information is provided on this After Visit Summary.  MyChart is used to connect with patients for Virtual Visits (Telemedicine).  Patients are able to view lab/test results, encounter notes, upcoming appointments, etc.  Non-urgent messages can be sent to your provider as well.   To learn more about what you can do with MyChart, go to [email or web address].    Your next appointment:   1 year(s)  The format for your next appointment:   Either In Person or Virtual  Provider:   Dr Klein   Other Instructions   

## 2020-01-22 NOTE — Progress Notes (Signed)
PCP:  Jonathon Resides, MD Electrophysiologist: Dr. Garret Reddish Kelli Pena is a 86 y.o. female with past medical history of HTN, hypothyroidism, and AF who presents today for routine electrophysiology followup. They are seen for Dr. Caryl Comes.   Since last being seen in our clinic, the patient reports doing very well overall. She has "bone on bone" arthritis in both knees, and this is her primary limiting factor. She worked with PT for a time last year, but ultimately was unable to progress due to weakness.  She denies symptoms of palpitations, chest pain, shortness of breath, orthopnea, PND, lower extremity edema, claudication, dizziness, presyncope, syncope, bleeding, or neurologic sequela. The patient is tolerating medications without difficulties.   The patient feels that she is tolerating medications without difficulties and is otherwise without complaint today.   Past Medical History:  Diagnosis Date  . Arthritis   . Atrial fibrillation ([place])   . Complication of anesthesia    trouble waking up, very cold, hypotension  . Diabetes mellitus without complication ([place])   . Fibromyalgia   . Hypertension   . Hypothyroid    Past Surgical History:  Procedure Laterality Date  . CHOLECYSTECTOMY  1980s?   open chole  . EYE SURGERY    . HIP SURGERY      Current Outpatient Medications  Medication Sig Dispense Refill  . aspirin EC 81 MG tablet Take 162 mg by mouth daily.    Marland Kitchen CRANBERRY-VITAMIN C PO Take 1 tablet by mouth daily.    Marland Kitchen diltiazem (CARDIZEM CD) 120 MG 24 hr capsule Take 1 capsule (120 mg total) by mouth daily. Please keep upcoming in March before anymore refills. Thank you 30 capsule 0  . DULoxetine (CYMBALTA) 30 MG capsule Take 30 mg by mouth daily.    Marland Kitchen gabapentin (NEURONTIN) 600 MG tablet Take 600 mg by mouth at bedtime.     . hydrochlorothiazide (HYDRODIURIL) 25 MG tablet Take 1 tablet (25 mg total) by mouth daily. 90 tablet 0  . HYDROcodone-acetaminophen (NORCO) 10-325 MG  tablet Take 0.5-1 tablets by mouth every 4 (four) hours as needed for moderate pain or severe pain.     Marland Kitchen ibuprofen (ADVIL,MOTRIN) 200 MG tablet Take 200-400 mg by mouth every 8 (eight) hours as needed for moderate pain.     Marland Kitchen levothyroxine (SYNTHROID, LEVOTHROID) 88 MCG tablet Take 88 mcg by mouth daily before breakfast.    . lidocaine (LIDODERM) 5 % Place 1 patch onto the skin daily as needed (for pain). Apply to both knees and right shoulder. Remove & Discard patch within 12 hours or as directed by MD    . Lutein 20 MG TABS Take 1 tablet by mouth daily.    Marland Kitchen MAGNESIUM PO Take 1 tablet by mouth daily.    . Meclizine HCl (BONINE PO) Take 1 tablet by mouth daily as needed (nausea).    . Multiple Vitamin (MULTIVITAMIN) tablet Take 1 tablet by mouth daily.    Marland Kitchen nystatin-triamcinolone ointment (MYCOLOG) Apply 1 application topically 3 (three) times a week. Corners of mouth    . pioglitazone (ACTOS) 15 MG tablet Take 15 mg by mouth at bedtime.  1  . polyethylene glycol (MIRALAX / GLYCOLAX) packet Take 8.5-17 g by mouth daily as needed for moderate constipation.     Marland Kitchen POTASSIUM PO Take 1 tablet by mouth daily.    . traMADol (ULTRAM) 50 MG tablet Take 50 mg by mouth 3 (three) times daily as needed for moderate pain  or severe pain.     . Artificial Tear Ointment (DRY EYES OP) Apply 1 drop to eye daily as needed (dry eyes).     No current facility-administered medications for this visit.    Allergies  Allergen Reactions  . Tetanus Toxoid, Adsorbed Swelling    Severe swelling on arm.  . Tetanus Toxoids Swelling    Social History   Socioeconomic History  . Marital status: Widowed    Spouse name: Not on file  . Number of children: Not on file  . Years of education: 8  . Highest education level: Not on file  Occupational History  . Occupation: RETIRED   Tobacco Use  . Smoking status: Former Smoker    Packs/day: 1.50    Years: 20.00    Pack years: 30.00    Types: Cigarettes    Quit date:  11/21/1965    Years since quitting: 54.2  . Smokeless tobacco: Never Used  Substance and Sexual Activity  . Alcohol use: Yes    Comment: Rarely   . Drug use: No  . Sexual activity: Never  Other Topics Concern  . Not on file  Social History Narrative   Marital Status: Widowed    Children: Daughter Oceanographer)   Pets:  Dog (1)    Living Situation: Lives alone.   Occupation: Retired Training and development officer)    Education: Engineer, agricultural    Tobacco Use/Exposure:  She smoked 1 - 1 1/2 ppd for 20 years. She quit in 1967.    Alcohol Use:  Rarely   Drug Use:  None   Diet:  Regular   Exercise:  None   Hobbies: Playing with Dog/ Taking Picture/ Reading            Social Determinants of Health   Financial Resource Strain:   . Difficulty of Paying Living Expenses: Not on file  Food Insecurity:   . Worried About Programme researcher, broadcasting/film/video in the Last Year: Not on file  . Ran Out of Food in the Last Year: Not on file  Transportation Needs:   . Lack of Transportation (Medical): Not on file  . Lack of Transportation (Non-Medical): Not on file  Physical Activity:   . Days of Exercise per Week: Not on file  . Minutes of Exercise per Session: Not on file  Stress:   . Feeling of Stress : Not on file  Social Connections:   . Frequency of Communication with Friends and Family: Not on file  . Frequency of Social Gatherings with Friends and Family: Not on file  . Attends Religious Services: Not on file  . Active Member of Clubs or Organizations: Not on file  . Attends Banker Meetings: Not on file  . Marital Status: Not on file  Intimate Partner Violence:   . Fear of Current or Ex-Partner: Not on file  . Emotionally Abused: Not on file  . Physically Abused: Not on file  . Sexually Abused: Not on file     Review of Systems: General: No chills, fever, night sweats or weight changes  Cardiovascular:  No chest pain, dyspnea on exertion, edema, orthopnea, palpitations, paroxysmal nocturnal  dyspnea Dermatological: No rash, lesions or masses Respiratory: No cough, dyspnea Urologic: No hematuria, dysuria Abdominal: No nausea, vomiting, diarrhea, bright red blood per rectum, melena, or hematemesis Neurologic: No visual changes, weakness, changes in mental status All other systems reviewed and are otherwise negative except as noted above.  Physical Exam: Vitals:   01/22/20 1212  BP: 118/60  Pulse: 69  Weight: 147 lb (66.7 kg)  Height: 5' (1.524 m)    GEN- The patient is well appearing, alert and oriented x 3 today.   HEENT: normocephalic, atraumatic; sclera clear, conjunctiva pink; hearing intact; oropharynx clear; neck supple, no JVP Lymph- no cervical lymphadenopathy Lungs- Clear to ausculation bilaterally, normal work of breathing.  No wheezes, rales, rhonchi Heart- Regular rate and rhythm, no murmurs, rubs or gallops, PMI not laterally displaced GI- soft, non-tender, non-distended, bowel sounds present, no hepatosplenomegaly Extremities- no clubbing, cyanosis, or edema; DP/PT/radial pulses 2+ bilaterally MS- no significant deformity or atrophy Skin- warm and dry, no rash or lesion Psych- euthymic mood, full affect Neuro- strength and sensation are intact  EKG is ordered. Personal review of EKG from today shows underlying atrial fibrillation and controlled ventricular raates at 69 bpm  Assessment and Plan:  1. Paroxysmal AF CHA2DS2VASC of at least 5   We have had multiple discussions about OAC, and felt best not to proceed with pt history of bleeding and overall frailty. Agree this is best way to proceed.  2. HTN Continue on current medications  3. Recurrent LGIB, hx of both small and large colon perforations Follows with Dr. Loreta Ave, GI  4. Hypokalemia In the past She is now taking daily potassium, and has a PCP follow up for labwork within the next couple of weeks.  She is doing very well overall. Will continue yearly follow ups. Sooner as needed with any  symptoms.   Graciella Freer, PA-C  01/22/20 12:31 PM

## 2020-02-07 ENCOUNTER — Other Ambulatory Visit: Payer: Self-pay | Admitting: Internal Medicine

## 2020-02-24 ENCOUNTER — Telehealth (INDEPENDENT_AMBULATORY_CARE_PROVIDER_SITE_OTHER): Payer: Self-pay

## 2020-02-24 NOTE — Telephone Encounter (Signed)
Pt's daughter, Kelli Pena, called stating they would like to postpone x at least one more month OR discontinue pt injections altogether. Kelli Pena stated that she herself has many new health issues that are leaving her housebound for the next several weeks, as well as her husband has a new major debilitating medical dx. Kelli Pena also stated that taking Kelli Pena out of the home and to her doctor appointments is difficult on the pt, with her being negatively affected for days after. Kelli Pena would like to know what is the safest length the patient can go without injections, or what would happen if the injections were stopped completely? I spoke with Kelli Pena and let her know that Dr. Luciana Axe had recommended she return in one week from last appointment as scheduled for Avastin OS to avoid losing the eye itself and prevent pain. Kelli Pena would like more detail from Dr. Luciana Axe on what the benefits vs consequences are for stopping treatment altogether or also waiting another month from today for treatment. I let Kelli Pena know that Dr. Luciana Axe does not recommend stopping treatments altogether, but it is ultimately the patient/family decision. She would like more information prior to proceeding with their decision.

## 2020-02-24 NOTE — Telephone Encounter (Signed)
We will communicate to the patient and her family proxy that the goal for this patient is to salvage the globe, prevent ongoing pain from the medical condition known as neovascular glaucoma.  The intraocular pressure of 44 has a high risk of giving her long-term pain and consequences from using topical medications which have profound side effects most notably topical atropine.  We would do everything anything and everything that the patient and family wish to in order to accomplish the goals which have been stated in prior examinations and visits..  We certainly respect the patient's choices in this matter.  The time between injections is typically 6 weeks.  That time is closely approaching in the left eye.  It is common that surface of the eye hematomas or, "blood blisters " might develop with an injection.  These types of side effects are common in an eye which is this sick when she recently returned for her visit January 22, 2020 after a long delay.  The external blood blister type of appearance it has no visual effect upon the eye.  We stand ready to assist in Kelli Pena's care at moments notice

## 2020-02-25 NOTE — Telephone Encounter (Signed)
Called Pt's daughter, [name], back, and relayed information from Dr. Luciana Axe, including recommendation to continue with treatments. [name] stated she spoke with her husband and they would like to make an appointment for Kelli Pena's injection. I routed the call over to [facility] to schedule.

## 2020-03-11 ENCOUNTER — Encounter (INDEPENDENT_AMBULATORY_CARE_PROVIDER_SITE_OTHER): Payer: Medicare Other | Admitting: Ophthalmology

## 2020-10-21 ENCOUNTER — Emergency Department (HOSPITAL_COMMUNITY): Payer: Medicare Other

## 2020-10-21 ENCOUNTER — Emergency Department (HOSPITAL_COMMUNITY)
Admission: EM | Admit: 2020-10-21 | Discharge: 2020-10-22 | Disposition: A | Discharge status: Home / Self Care | Payer: Medicare Other | Attending: Emergency Medicine | Admitting: Emergency Medicine

## 2020-10-21 ENCOUNTER — Encounter (HOSPITAL_COMMUNITY): Payer: Self-pay

## 2020-10-21 ENCOUNTER — Other Ambulatory Visit: Payer: Self-pay

## 2020-10-21 DIAGNOSIS — Z7982 Long term (current) use of aspirin: Secondary | ICD-10-CM | POA: Insufficient documentation

## 2020-10-21 DIAGNOSIS — Z87891 Personal history of nicotine dependence: Secondary | ICD-10-CM | POA: Diagnosis not present

## 2020-10-21 DIAGNOSIS — E039 Hypothyroidism, unspecified: Secondary | ICD-10-CM | POA: Diagnosis not present

## 2020-10-21 DIAGNOSIS — R52 Pain, unspecified: Secondary | ICD-10-CM

## 2020-10-21 DIAGNOSIS — W010XXA Fall on same level from slipping, tripping and stumbling without subsequent striking against object, initial encounter: Secondary | ICD-10-CM | POA: Diagnosis not present

## 2020-10-21 DIAGNOSIS — E119 Type 2 diabetes mellitus without complications: Secondary | ICD-10-CM | POA: Diagnosis not present

## 2020-10-21 DIAGNOSIS — M25552 Pain in left hip: Secondary | ICD-10-CM | POA: Insufficient documentation

## 2020-10-21 DIAGNOSIS — I10 Essential (primary) hypertension: Secondary | ICD-10-CM | POA: Insufficient documentation

## 2020-10-21 DIAGNOSIS — Y92009 Unspecified place in unspecified non-institutional (private) residence as the place of occurrence of the external cause: Secondary | ICD-10-CM | POA: Insufficient documentation

## 2020-10-21 DIAGNOSIS — Z79899 Other long term (current) drug therapy: Secondary | ICD-10-CM | POA: Diagnosis not present

## 2020-10-21 DIAGNOSIS — R410 Disorientation, unspecified: Secondary | ICD-10-CM | POA: Insufficient documentation

## 2020-10-21 DIAGNOSIS — R519 Headache, unspecified: Secondary | ICD-10-CM | POA: Diagnosis not present

## 2020-10-21 DIAGNOSIS — W19XXXA Unspecified fall, initial encounter: Secondary | ICD-10-CM

## 2020-10-21 LAB — CBC WITH DIFFERENTIAL/PLATELET
Abs Immature Granulocytes: 0.03 10*3/uL (ref 0.00–0.07)
Basophils Absolute: 0 10*3/uL (ref 0.0–0.1)
Basophils Relative: 0 %
Eosinophils Absolute: 0 10*3/uL (ref 0.0–0.5)
Eosinophils Relative: 0 %
HCT: 39.3 % (ref 36.0–46.0)
Hemoglobin: 13.4 g/dL (ref 12.0–15.0)
Immature Granulocytes: 0 %
Lymphocytes Relative: 6 %
Lymphs Abs: 0.9 10*3/uL (ref 0.7–4.0)
MCH: 31.9 pg (ref 26.0–34.0)
MCHC: 34.1 g/dL (ref 30.0–36.0)
MCV: 93.6 fL (ref 80.0–100.0)
Monocytes Absolute: 0.8 10*3/uL (ref 0.1–1.0)
Monocytes Relative: 6 %
Neutro Abs: 11.8 10*3/uL — ABNORMAL HIGH (ref 1.7–7.7)
Neutrophils Relative %: 88 %
Platelets: 365 10*3/uL (ref 150–400)
RBC: 4.2 MIL/uL (ref 3.87–5.11)
RDW: 14.1 % (ref 11.5–15.5)
WBC: 13.5 10*3/uL — ABNORMAL HIGH (ref 4.0–10.5)
nRBC: 0 % (ref 0.0–0.2)

## 2020-10-21 LAB — BASIC METABOLIC PANEL
Anion gap: 13 (ref 5–15)
BUN: 14 mg/dL (ref 8–23)
CO2: 24 mmol/L (ref 22–32)
Calcium: 9.2 mg/dL (ref 8.9–10.3)
Chloride: 101 mmol/L (ref 98–111)
Creatinine, Ser: 0.82 mg/dL (ref 0.44–1.00)
GFR, Estimated: >60 mL/min (ref >60)
Glucose, Bld: 139 mg/dL — ABNORMAL HIGH (ref 70–99)
Potassium: 3.2 mmol/L — ABNORMAL LOW (ref 3.5–5.1)
Sodium: 138 mmol/L (ref 135–145)

## 2020-10-21 LAB — URINALYSIS, ROUTINE W REFLEX MICROSCOPIC
Glucose, UA: NEGATIVE mg/dL
Ketones, ur: 5 mg/dL — AB
Leukocytes,Ua: NEGATIVE
Nitrite: NEGATIVE
Protein, ur: >=300 mg/dL — AB
Specific Gravity, Urine: 1.035 — ABNORMAL HIGH (ref 1.005–1.030)
pH: 5 (ref 5.0–8.0)

## 2020-10-21 MED ORDER — ACETAMINOPHEN 500 MG PO TABS
1000.0000 mg | ORAL_TABLET | Freq: Once | ORAL | Status: AC
  Administered 2020-10-21: 1000 mg via ORAL
  Filled 2020-10-21: qty 2

## 2020-10-21 MED ORDER — HALOPERIDOL LACTATE 5 MG/ML IJ SOLN
2.0000 mg | Freq: Once | INTRAMUSCULAR | Status: AC
  Administered 2020-10-21: 2 mg via INTRAMUSCULAR
  Filled 2020-10-21: qty 1

## 2020-10-21 NOTE — ED Notes (Signed)
Straight cath attempted with nurse tech. Unable to obtain urine sample. Difficult anatomy

## 2020-10-21 NOTE — Progress Notes (Signed)
CSW called pt's daughter [place]athe[place] at (cell) ph: [phone] or (home) ph: [phone].  Pt's daughter Marcha Solders states preference is [facility] for [facility], but has used [place] in the past and is open to any suggestions should those two not be available.  RN CM for [facility] covering [facility] ED tonight is aware and will follow up after pt D/C's if necessary to facilitate HH.  Pt's daughter who was a [facility] and [facility] D/C planner until 2011 is satisfied with options presented to her.  CSW will continue to follow for D/C needs.  Dorothe Pea. Simaya Lumadue  MSW, LCSW, LCAS, CCS Transitions of Care Clinical Social Worker Care Coordination Department Ph: [phone]

## 2020-10-21 NOTE — ED Triage Notes (Signed)
ground level fall, denies LOC or dizziness. C/o hitting the back of her head. Lives at home alone but family present on EMS arrival. Pt still down when family arrived, unknown downtime but pt believes around 1100. Denies blood thinners

## 2020-10-21 NOTE — ED Provider Notes (Signed)
Patient care assumed at 1600. Patient comes from home following a fall.  Lives alone.  Pt confused compared to baseline per family.  Imaging neg for acute fracture. CBC with leukocytosis.  UA pending.    UA not consistent with UTI. Daughter came to bedside to assess the patient. Daughter states that the patient is at her baseline and would like her to go back home. Family will be able to assist with home care and do not want placement at this time. Case management consulted regarding assistance in home health services. Plan to discharge home in care of family.   Tilden Fossa, MD 10/21/20 [phone]

## 2020-10-21 NOTE — Progress Notes (Signed)
   10/21/20 2036  TOC ED Mini Assessment  TOC Time spent with patient (minutes): 15 (Patient is at the [facility] ED (information obtained by J. Riffey CSW/ RNCM faxing in the referral to [facility])  Patient has agreed to [facility] services, patient would prefer [facility] but is open to [facility] as well. CM reviewed referral and sent it to [facility], ED CM will follow up on the referral tomorrow and will contact the patient. No other RN CM needs identified.

## 2020-10-21 NOTE — ED Provider Notes (Signed)
[facility]-EMERGENCY DEPT Provider Note   CSN: [id number] Arrival date & time: 10/21/20  1411     History Chief Complaint  Patient presents with  . Fall    Kelli Pena is a 86 y.o. female.  86 yo F with a chief complaints of a fall.  Reportedly this happened last night.  The patient does not remember this happening though supposedly told the nurse that she fell yesterday and struck the back of her head.  She not sure exactly why she fell.  Complaining mostly of left hip pain on my exam was complaining a headache to EMS.  Patient seems somewhat confused.  Level 5 caveat altered mental status.   Fall       Past Medical History:  Diagnosis Date  . Arthritis   . Atrial fibrillation (HCC)   . Complication of anesthesia    trouble waking up, very cold, hypotension  . Diabetes mellitus without complication (HCC)   . Fibromyalgia   . Hypertension   . Hypothyroid     Patient Active Problem List   Diagnosis Date Noted  . C. difficile colitis 10/18/2014  . Abdominal pain   . Hyponatremia 10/15/2014  . Sepsis (HCC) 10/15/2014  . Leukocytosis 10/15/2014  . Hypokalemia   . Atrial fibrillation (HCC)   . Tibia fracture 07/17/2014  . Osteoarthritis 06/16/2013  . Unspecified vitamin D deficiency 06/16/2013  . Neuropathy 04/05/2013  . Femur fracture, left (HCC) 04/05/2013  . Hypothyroidism 04/02/2013  . Essential hypertension, benign 04/02/2013    Past Surgical History:  Procedure Laterality Date  . CHOLECYSTECTOMY  1980s?   open chole  . EYE SURGERY    . HIP SURGERY       OB History   No obstetric history on file.     Family History  Problem Relation Age of Onset  . Stroke Father   . Arthritis Daughter   . Arthritis Sister     Social History   Tobacco Use  . Smoking status: Former Smoker    Packs/day: 1.50    Years: 20.00    Pack years: 30.00    Types: Cigarettes    Quit date: 11/21/1965    Years since quitting: 54.9  .  Smokeless tobacco: Never Used  Substance Use Topics  . Alcohol use: Yes    Comment: Rarely   . Drug use: No    Home Medications Prior to Admission medications   Medication Sig Start Date End Date Taking? Authorizing Provider  Artificial Tear Ointment (DRY EYES OP) Apply 1 drop to eye daily as needed (dry eyes).    [provider]  aspirin EC 81 MG tablet Take 162 mg by mouth daily.    [provider]  CRANBERRY-VITAMIN C PO Take 1 tablet by mouth daily.    [provider]  diltiazem (CARDIZEM CD) 120 MG 24 hr capsule Take 1 capsule (120 mg total) by mouth daily. 02/07/20   Duke Salvia, MD  DULoxetine (CYMBALTA) 30 MG capsule Take 30 mg by mouth daily. 01/17/19   [provider]  gabapentin (NEURONTIN) 600 MG tablet Take 600 mg by mouth at bedtime.     [provider]  hydrochlorothiazide (HYDRODIURIL) 25 MG tablet Take 1 tablet (25 mg total) by mouth daily. 11/02/15   Cassell Clement, MD  HYDROcodone-acetaminophen (NORCO) 10-325 MG tablet Take 0.5-1 tablets by mouth every 4 (four) hours as needed for moderate pain or severe pain.     [provider]  ibuprofen (ADVIL,MOTRIN) 200 MG tablet Take 200-400 mg by mouth every 8 (eight) hours as needed for moderate pain.     [provider]  levothyroxine (SYNTHROID, LEVOTHROID) 88 MCG tablet Take 88 mcg by mouth daily before breakfast.    [provider]  lidocaine (LIDODERM) 5 % Place 1 patch onto the skin daily as needed (for pain). Apply to both knees and right shoulder. Remove & Discard patch within 12 hours or as directed by MD    [provider]  Lutein 20 MG TABS Take 1 tablet by mouth daily.    [provider]  MAGNESIUM PO Take 1 tablet by mouth daily.    [provider]  Meclizine HCl (BONINE PO) Take 1 tablet by mouth daily as needed (nausea).    [provider]  Multiple Vitamin (MULTIVITAMIN) tablet Take 1 tablet by mouth  daily.    [provider]  nystatin-triamcinolone ointment (MYCOLOG) Apply 1 application topically 3 (three) times a week. Corners of mouth 06/27/15   [provider]  pioglitazone (ACTOS) 15 MG tablet Take 15 mg by mouth at bedtime. 05/24/18   [provider]  polyethylene glycol (MIRALAX / GLYCOLAX) packet Take 8.5-17 g by mouth daily as needed for moderate constipation.  12/28/12   Joseph Art, DO  POTASSIUM PO Take 1 tablet by mouth daily.    [provider]  traMADol (ULTRAM) 50 MG tablet Take 50 mg by mouth 3 (three) times daily as needed for moderate pain or severe pain.     [provider]    Allergies    Tetanus toxoid, adsorbed and Tetanus toxoids  Review of Systems   Review of Systems  Unable to perform ROS: Mental status change    Physical Exam Updated Vital Signs BP (!) 108/95   Pulse 88   Resp 17   SpO2 98%   Physical Exam Vitals and nursing note reviewed.  Constitutional:      General: She is not in acute distress.    Appearance: She is well-developed. She is not diaphoretic.  HENT:     Head: Normocephalic and atraumatic.  Eyes:     Pupils: Pupils are equal, round, and reactive to light.  Cardiovascular:     Rate and Rhythm: Normal rate and regular rhythm.     Heart sounds: No murmur heard.  No friction rub. No gallop.   Pulmonary:     Effort: Pulmonary effort is normal.     Breath sounds: No wheezing or rales.  Abdominal:     General: There is no distension.     Palpations: Abdomen is soft.     Tenderness: There is no abdominal tenderness.  Musculoskeletal:        General: No tenderness.     Cervical back: Normal range of motion and neck supple.     Comments: Chronic appearing deformity to the bilateral wrist hands and knees.  Centripetal obesity.  Able to range the hips without obvious pain.  Palpated from head to toe without any obvious areas of bony tenderness.  Skin:    General: Skin is warm and dry.    Neurological:     Mental Status: She is alert.     Comments: Confused response     ED Results / Procedures / Treatments   Labs (all labs ordered are listed, but only abnormal results are displayed) Labs Reviewed  CBC WITH DIFFERENTIAL/PLATELET - Abnormal; Notable for the following components:  Result Value   WBC 13.5 (*)    Neutro Abs 11.8 (*)    All other components within normal limits  BASIC METABOLIC PANEL - Abnormal; Notable for the following components:   Potassium 3.2 (*)    Glucose, Bld 139 (*)    All other components within normal limits  URINE CULTURE  URINALYSIS, ROUTINE W REFLEX MICROSCOPIC    EKG None  Radiology DG Chest 1 View  Result Date: 10/21/2020 CLINICAL DATA:  Larey Seat today. Left hip pain. History of hypertension and atrial fibrillation. EXAM: CHEST  1 VIEW COMPARISON:  10/16/2014 FINDINGS: Cardiac silhouette is normal in size. There are stable aortic atherosclerotic calcifications. No mediastinal or hilar masses. Linear/reticular opacities noted in the mid to lower lungs, consistent with scarring and/or atelectasis, similar to the prior study. No evidence of pneumonia or pulmonary edema. No convincing pleural effusion and no pneumothorax. Skeletal structures are demineralized. There are arthropathic changes of both shoulders. IMPRESSION: No acute cardiopulmonary disease. Electronically Signed   By: Amie Portland M.D.   On: 10/21/2020 15:38   CT Head Wo Contrast  Result Date: 10/21/2020 CLINICAL DATA:  Ground level fall. Denies loss of consciousness. Patient reports hitting the back of her head. EXAM: CT HEAD WITHOUT CONTRAST CT CERVICAL SPINE WITHOUT CONTRAST TECHNIQUE: Multidetector CT imaging of the head and cervical spine was performed following the standard protocol without intravenous contrast. Multiplanar CT image reconstructions of the cervical spine were also generated. COMPARISON:  01/29/2019. FINDINGS: CT HEAD FINDINGS Brain: No evidence of acute  infarction, hemorrhage, hydrocephalus, extra-axial collection or mass lesion/mass effect. There is patchy white matter hypoattenuation, right greater than left, consistent with moderate chronic microvascular ischemic change. Vascular: No hyperdense vessel or unexpected calcification. Skull: Normal. Negative for fracture or focal lesion. Sinuses/Orbits: Globes and orbits are unremarkable. Visualized sinuses and mastoid air cells are clear. Other: None. CT CERVICAL SPINE FINDINGS Alignment: Slight reversal of the normal cervical lordosis, apex at C4. No spondylolisthesis. Skull base and vertebrae: No acute fracture. No primary bone lesion or focal pathologic process. Soft tissues and spinal canal: No prevertebral fluid or swelling. No visible canal hematoma. Disc levels: Moderate loss of disc height at C3-C4. Marked loss of disc height at C4-C5, C5-C6 and C6-C7. No convincing disc herniation. Upper chest: No acute findings. Chronic interstitial thickening at the lung apices. Other: None. IMPRESSION: HEAD CT 1. No acute intracranial abnormalities. 2. No skull fracture. CERVICAL CT 1. No fracture or acute finding. Electronically Signed   By: Amie Portland M.D.   On: 10/21/2020 15:59   CT Cervical Spine Wo Contrast  Result Date: 10/21/2020 CLINICAL DATA:  Ground level fall. Denies loss of consciousness. Patient reports hitting the back of her head. EXAM: CT HEAD WITHOUT CONTRAST CT CERVICAL SPINE WITHOUT CONTRAST TECHNIQUE: Multidetector CT imaging of the head and cervical spine was performed following the standard protocol without intravenous contrast. Multiplanar CT image reconstructions of the cervical spine were also generated. COMPARISON:  01/29/2019. FINDINGS: CT HEAD FINDINGS Brain: No evidence of acute infarction, hemorrhage, hydrocephalus, extra-axial collection or mass lesion/mass effect. There is patchy white matter hypoattenuation, right greater than left, consistent with moderate chronic microvascular  ischemic change. Vascular: No hyperdense vessel or unexpected calcification. Skull: Normal. Negative for fracture or focal lesion. Sinuses/Orbits: Globes and orbits are unremarkable. Visualized sinuses and mastoid air cells are clear. Other: None. CT CERVICAL SPINE FINDINGS Alignment: Slight reversal of the normal cervical lordosis, apex at C4. No spondylolisthesis. Skull base and vertebrae: No acute fracture.  No primary bone lesion or focal pathologic process. Soft tissues and spinal canal: No prevertebral fluid or swelling. No visible canal hematoma. Disc levels: Moderate loss of disc height at C3-C4. Marked loss of disc height at C4-C5, C5-C6 and C6-C7. No convincing disc herniation. Upper chest: No acute findings. Chronic interstitial thickening at the lung apices. Other: None. IMPRESSION: HEAD CT 1. No acute intracranial abnormalities. 2. No skull fracture. CERVICAL CT 1. No fracture or acute finding. Electronically Signed   By: Amie Portland M.D.   On: 10/21/2020 15:59   DG Knee Complete 4 Views Left  Result Date: 10/21/2020 CLINICAL DATA:  Fall.  Left hip and knee pain. EXAM: LEFT KNEE - COMPLETE 4+ VIEW COMPARISON:  07/16/2014 FINDINGS: None no acute fracture. Marked mediolateral joint space narrowing with subchondral sclerosis. Prominent associated marginal osteophytes. Milder patellofemoral joint space compartment osteophytes. Old healed fracture of the proximal tibia. Small joint effusion. IMPRESSION: 1. No fracture or dislocation. 2. Significant tricompartmental degenerative changes most severely involving the medial and lateral compartments. Small joint effusion. Electronically Signed   By: Amie Portland M.D.   On: 10/21/2020 15:42   DG Hip Unilat W or Wo Pelvis 2-3 Views Left  Result Date: 10/21/2020 CLINICAL DATA:  Larey Seat today.  Left hip pain. EXAM: DG HIP (WITH OR WITHOUT PELVIS) 2-3V LEFT COMPARISON:  Left femur, 07/16/2014. FINDINGS: No fracture or bone lesion. There is concentric left hip  joint space narrowing. Right total hip arthroplasty appears well seated and aligned. SI joints and symphysis pubis are normally spaced and aligned. Skeletal structures are diffusely demineralized. There are iliac and femoral artery vascular calcifications. Soft tissues otherwise unremarkable. IMPRESSION: 1. No fracture, dislocation or acute finding. Electronically Signed   By: Amie Portland M.D.   On: 10/21/2020 15:40    Procedures Procedures (including critical care time)  Medications Ordered in ED Medications  acetaminophen (TYLENOL) tablet 1,000 mg (1,000 mg Oral Given 10/21/20 1459)    ED Course  I have reviewed the triage vital signs and the nursing notes.  Pertinent labs & imaging results that were available during my care of the patient were reviewed by me and considered in my medical decision making (see chart for details).    MDM Rules/Calculators/A&P                          86 yo F with a cc of a fall.  Patient fell last night.  EMS reported lives at home alone, and seemed reasonable with nursing though very confused for me on exam.  Workup here unremarkable, family was reported on their way here by EMS though have not arrived.  I had a discussion with them on the phone, patient has had a progressive decline at least since August.  Worsening over the past month or so.   Plan for UA.  Likely admit for acute on chronic AMS.    Signed out to Dr. Madilyn Hook, please see their note for further details of care in the ED.   The patients results and plan were reviewed and discussed.   Any x-rays performed were independently reviewed by myself.   Differential diagnosis were considered with the presenting HPI.  Medications  acetaminophen (TYLENOL) tablet 1,000 mg (1,000 mg Oral Given 10/21/20 1459)    Vitals:   10/21/20 1421 10/21/20 1558 10/21/20 1600  BP: 113/65 (!) 135/112 (!) 108/95  Pulse: 85 83 88  Resp:  19 17  SpO2: 98% 98% 98%  Final diagnoses:  Disorientation       Final Clinical Impression(s) / ED Diagnoses Final diagnoses:  Disorientation    Rx / DC Orders ED Discharge Orders    None       Melene PlanFloyd, Avey Mcmanamon, DO 10/21/20 1634

## 2020-10-21 NOTE — ED Notes (Signed)
This pt. discharged no concerns. Waiting on PTAR.

## 2020-10-23 LAB — URINE CULTURE

## 2020-10-29 ENCOUNTER — Telehealth: Payer: Self-pay

## 2020-10-29 NOTE — Telephone Encounter (Signed)
Attempted to contact patient's daughter Santina Evans to schedule a Palliative care consult appointment. No answer left a voicemail to return call.

## 2020-11-04 ENCOUNTER — Telehealth: Payer: Self-pay

## 2020-11-04 NOTE — Telephone Encounter (Signed)
Spoke with patients daughter [name] and scheduled an in-person Palliative Consult for 11/18/20 @ 3PM   COVID screening was negative. No pets in home. Patients daughter is staying with her at this time.    Consent obtained; updated Outlook/Netsmart/Team List and Epic.

## 2020-11-18 ENCOUNTER — Other Ambulatory Visit: Payer: Self-pay

## 2020-11-18 ENCOUNTER — Other Ambulatory Visit: Payer: Medicare Other | Admitting: Hospice

## 2020-11-18 DIAGNOSIS — F0390 Unspecified dementia without behavioral disturbance: Secondary | ICD-10-CM

## 2020-11-18 DIAGNOSIS — Z515 Encounter for palliative care: Secondary | ICD-10-CM

## 2020-11-18 NOTE — Progress Notes (Signed)
PATIENT NAME: Kelli Pena [place] [id number] [phone] (home)  DOB: [birth year] MRN: [id number]  PRIMARY CARE PROVIDER:    Jonathon Resides, MD,  [address] [place] [address] [phone]  REFERRING PROVIDER:   Jonathon Resides, MD [address] [place],  [place] [address] [phone]  RESPONSIBLE PARTY:  [facility] Extended Emergency Contact Information Primary Emergency Contact: Kelli Pena Address: [address]          [place], [place] [address] [address] of [place] Phone: [phone] Mobile Phone: [phone] Relation: Daughter Secondary Emergency Contact: Darleen Crocker Address: [place],  [address] [address] of [place] Phone: [phone] Mobile Phone: [phone] Relation: Relative  I met face to face with patient and family in home/facility.  ADVANCE CARE PLANNING/RECOMMENDATIONS/PLAN:    Visit at the request of Dr. Ival Bible  for palliative consult. Visit consisted of building trust and discussions on Palliative Medicine as specialized medical care for people living with serious illness, aimed at facilitating better quality of life through symptoms relief, assisting with advance care plan and establishing complex decision making. Kelli Pena's spouse Kelli Pena was present during visit.   Discussion on the difference between Palliative and Hospice care. Palliative care and hospice have similar goals of managing symptoms, promoting comfort, improving quality of life, and maintaining a person's dignity. However, palliative care may be offered during any phase of a serious illness, while hospice care is usually offered when a person is expected to live for 6 months or less.  Visit consisted of counseling and education dealing with the complex and emotionally intense issues of symptom management and palliative care in the setting of serious and potentially life-threatening  illness.  Advance Care Planning: Our advance care planning conversation included a discussion about:    The value and importance of advance care planning  Exploration of goals of care in the event of a sudden injury or illness  Identification and preparation of a healthcare agent          Review and updating or creation of an advance directive document  CODE STATUS: Code status reviewed. Kelli Pena affirmed patient is a DNR. NP signed DNR form for patient; same document uploaded to [place]: Goals of care include to maximize quality of life and symptom management. Discussion on decision not to resuscitate or to de-escalate disease focused treatments due to poor prognosis.  Kelli Pena is open to hospice service in the near future.  NP as showed her that the transition from palliative to hospice service will be smooth. Palliative care team will continue to support patient, patient's family, and medical team.            Follow up Palliative Care Visit: Palliative care will continue to follow for goals of care clarification and symptom management.  Follow-up in a month.  Gave MOST form for next visit   Symptom Management: Patient continues to decline in functional status no longer able to ambulate. Patient had a fall in early Dec '21 with no fracture, no injuries, CT of head was negative.  She is currently bedbound.  Fall precautions in place. Weakness: PT/OT/ST is ongoing.  She is a feeder; no longer able to feed self.   She has Norco for pain, not taken it in a week.  Confusion and memory loss related to Dementia is progressive; incontinent of bowel and bladder FAST 7a,  limited language.  Education provided on dementia as a terminal disease disease trajectory. Palliative will continue to monitor for symptom management/decline and make recommendations as needed.  Family /Caregiver/Community Supports: Patient lives at home with her daughter Kelli Pena and her husband Kelli Pena. Strong family support system  identified. Nursing assistant 2x a week from advanced home care for assistance with ADLs.  I spent one our and 20 minutes, time includes time spent with patient/family, chart review, provider coordination,  and documentation. More than 50% of the time in this consultation was spent on counseling patient and coordinating communication.   CHIEF COMPLAIN/HISTORY OF PRESENT ILLNESS:  Kelli Pena is a 86 y.o. female with multiple medical problems including Dementia, Afib, HTN Arthritis,  Neuropathy  History obtained from review of EMR, discussion with Kelli Pena and  patient.   Palliative Care was asked to follow this patient by consultation request of Zanard, Bernadene Bell, MD to help address advance care planning and complex decision making. Thank you for the opportunity to participate in the care of Kelli Pena    CODE STATUS: DNR  PPS: 30%  HOSPICE ELIGIBILITY/DIAGNOSIS: TBD  PAST MEDICAL HISTORY:  Past Medical History:  Diagnosis Date  . Arthritis   . Atrial fibrillation ([place])   . Complication of anesthesia    trouble waking up, very cold, hypotension  . Diabetes mellitus without complication ([place])   . Fibromyalgia   . Hypertension   . Hypothyroid     SOCIAL HX:  Social History   Tobacco Use  . Smoking status: Former Smoker    Packs/day: 1.50    Years: 20.00    Pack years: 30.00    Types: Cigarettes    Quit date: 11/21/1965    Years since quitting: 55.0  . Smokeless tobacco: Never Used  Substance Use Topics  . Alcohol use: Yes    Comment: Rarely    FAMILY HX:  Family History  Problem Relation Age of Onset  . Stroke Father   . Arthritis Daughter   . Arthritis Sister      Labs:   No results for input(s): WBC, HGB, HCT, PLT, MCV in the last 168 hours. No results for input(s): NA, K, CL, CO2, BUN, CREATININE, GLUCOSE in the last 168 hours.  ALLERGIES:  Allergies  Allergen Reactions  . Tetanus Toxoid, Adsorbed Swelling    Severe swelling on arm.  . Tetanus  Toxoids Swelling     PERTINENT MEDICATIONS:  Outpatient Encounter Medications as of 11/18/2020  Medication Sig  . Artificial Tear Ointment (DRY EYES OP) Apply 1 drop to eye daily as needed (dry eyes).  Marland Kitchen aspirin EC 81 MG tablet Take 162 mg by mouth daily.  Marland Kitchen CRANBERRY-VITAMIN C PO Take 1 tablet by mouth daily.  Marland Kitchen diltiazem (CARDIZEM CD) 120 MG 24 hr capsule Take 1 capsule (120 mg total) by mouth daily.  . DULoxetine (CYMBALTA) 30 MG capsule Take 30 mg by mouth daily.  Marland Kitchen gabapentin (NEURONTIN) 600 MG tablet Take 600 mg by mouth at bedtime.   . hydrochlorothiazide (HYDRODIURIL) 25 MG tablet Take 1 tablet (25 mg total) by mouth daily.  Marland Kitchen HYDROcodone-acetaminophen (NORCO) 10-325 MG tablet Take 0.5-1 tablets by mouth every 4 (four) hours as needed for moderate pain or severe pain.   Marland Kitchen ibuprofen (ADVIL,MOTRIN) 200 MG tablet Take 200-400 mg by mouth every 8 (eight) hours as needed for moderate pain.   Marland Kitchen levothyroxine (SYNTHROID, LEVOTHROID) 88 MCG tablet Take 88 mcg by mouth daily before breakfast.  .  lidocaine (LIDODERM) 5 % Place 1 patch onto the skin daily as needed (for pain). Apply to both knees and right shoulder. Remove & Discard patch within 12 hours or as directed by MD  . Lutein 20 MG TABS Take 1 tablet by mouth daily.  Marland Kitchen MAGNESIUM PO Take 1 tablet by mouth daily.  . Meclizine HCl (BONINE PO) Take 1 tablet by mouth daily as needed (nausea).  . Multiple Vitamin (MULTIVITAMIN) tablet Take 1 tablet by mouth daily.  Marland Kitchen nystatin-triamcinolone ointment (MYCOLOG) Apply 1 application topically 3 (three) times a week. Corners of mouth  . pioglitazone (ACTOS) 15 MG tablet Take 15 mg by mouth at bedtime.  . polyethylene glycol (MIRALAX / GLYCOLAX) packet Take 8.5-17 g by mouth daily as needed for moderate constipation.   Marland Kitchen POTASSIUM PO Take 1 tablet by mouth daily.  . traMADol (ULTRAM) 50 MG tablet Take 50 mg by mouth 3 (three) times daily as needed for moderate pain or severe pain.    No  facility-administered encounter medications on file as of 11/18/2020.    PHYSICAL EXAM/ROS:  General: NAD, sleepy during most of visit Cardiovascular: regular rate and rhythm; denies chest pain Pulmonary: clear ant /post fields Abdomen: soft, nontender, + bowel sounds; no constipation GU: no suprapubic tenderness Extremities: no edema, no joint deformities Skin: no rashes to visible skin Neurological: Weakness but otherwise nonfocal  Note: Portions of this note were generated with [employer]. Dictation errors may occur despite best attempts at proofreading.  Teodoro Spray, NP

## 2020-12-22 DEATH — deceased
